# Patient Record
Sex: Female | Born: 1980 | Race: White | Hispanic: No | Marital: Married | Smoking: Former smoker
Health system: Southern US, Community
[De-identification: ages and names within clinical notes are randomized; demographics above are authoritative.]

## PROBLEM LIST (undated history)

## (undated) ENCOUNTER — Inpatient Hospital Stay: Payer: Self-pay

## (undated) DIAGNOSIS — O163 Unspecified maternal hypertension, third trimester: Secondary | ICD-10-CM

## (undated) DIAGNOSIS — F329 Major depressive disorder, single episode, unspecified: Secondary | ICD-10-CM

## (undated) DIAGNOSIS — Z9889 Other specified postprocedural states: Secondary | ICD-10-CM

## (undated) DIAGNOSIS — F419 Anxiety disorder, unspecified: Secondary | ICD-10-CM

## (undated) DIAGNOSIS — R519 Headache, unspecified: Secondary | ICD-10-CM

## (undated) DIAGNOSIS — J309 Allergic rhinitis, unspecified: Secondary | ICD-10-CM

## (undated) DIAGNOSIS — T4145XA Adverse effect of unspecified anesthetic, initial encounter: Secondary | ICD-10-CM

## (undated) DIAGNOSIS — R112 Nausea with vomiting, unspecified: Secondary | ICD-10-CM

## (undated) DIAGNOSIS — IMO0001 Reserved for inherently not codable concepts without codable children: Secondary | ICD-10-CM

## (undated) DIAGNOSIS — R51 Headache: Secondary | ICD-10-CM

## (undated) DIAGNOSIS — R030 Elevated blood-pressure reading, without diagnosis of hypertension: Secondary | ICD-10-CM

## (undated) DIAGNOSIS — K219 Gastro-esophageal reflux disease without esophagitis: Secondary | ICD-10-CM

## (undated) DIAGNOSIS — G43909 Migraine, unspecified, not intractable, without status migrainosus: Secondary | ICD-10-CM

## (undated) DIAGNOSIS — T8859XA Other complications of anesthesia, initial encounter: Secondary | ICD-10-CM

## (undated) DIAGNOSIS — G8929 Other chronic pain: Secondary | ICD-10-CM

## (undated) DIAGNOSIS — F32A Depression, unspecified: Secondary | ICD-10-CM

## (undated) DIAGNOSIS — O139 Gestational [pregnancy-induced] hypertension without significant proteinuria, unspecified trimester: Secondary | ICD-10-CM

## (undated) HISTORY — PX: DILATION AND CURETTAGE OF UTERUS: SHX78

## (undated) HISTORY — DX: Headache: R51

## (undated) HISTORY — DX: Depression, unspecified: F32.A

## (undated) HISTORY — DX: Gastro-esophageal reflux disease without esophagitis: K21.9

## (undated) HISTORY — DX: Allergic rhinitis, unspecified: J30.9

## (undated) HISTORY — DX: Major depressive disorder, single episode, unspecified: F32.9

## (undated) HISTORY — DX: Migraine, unspecified, not intractable, without status migrainosus: G43.909

## (undated) HISTORY — DX: Gestational (pregnancy-induced) hypertension without significant proteinuria, unspecified trimester: O13.9

## (undated) HISTORY — DX: Headache, unspecified: R51.9

## (undated) HISTORY — DX: Other chronic pain: G89.29

## (undated) HISTORY — DX: Elevated blood-pressure reading, without diagnosis of hypertension: R03.0

## (undated) HISTORY — DX: Unspecified maternal hypertension, third trimester: O16.3

## (undated) HISTORY — DX: Anxiety disorder, unspecified: F41.9

## (undated) HISTORY — DX: Reserved for inherently not codable concepts without codable children: IMO0001

---
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
## 2007-08-06 ENCOUNTER — Emergency Department: Payer: Self-pay | Admitting: Emergency Medicine

## 2008-11-01 IMAGING — US US PELV - US TRANSVAGINAL
1 series · 17 of 25 positions shown · non-contrast
Comparison: none

REASON FOR EXAM: vaginal bleeding, abnormal
COMMENTS:

[Series 1: us pelv - us transvaginal · 17 of 64 slices shown]
[im 1/64]
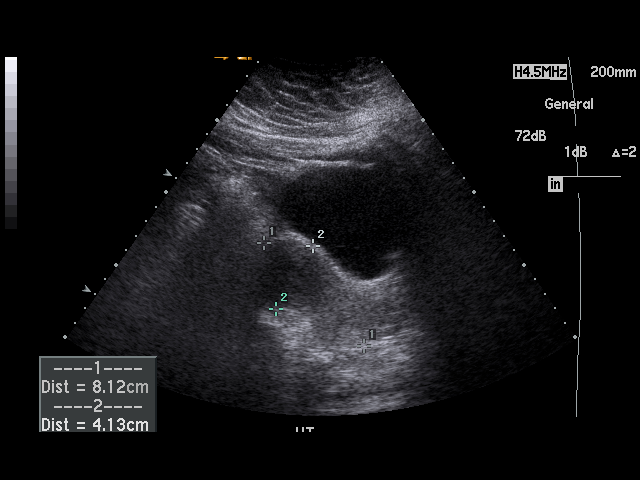
[im 6/64]
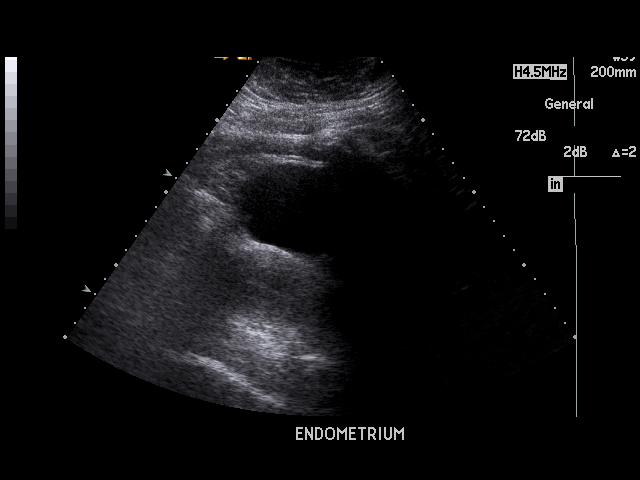
[im 8/64]
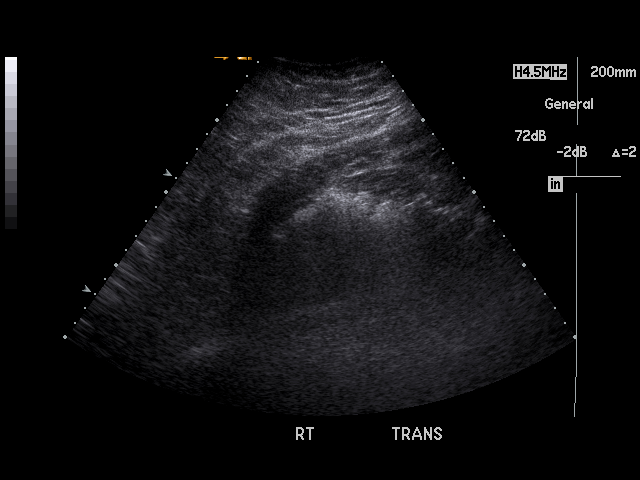
[im 14/64]
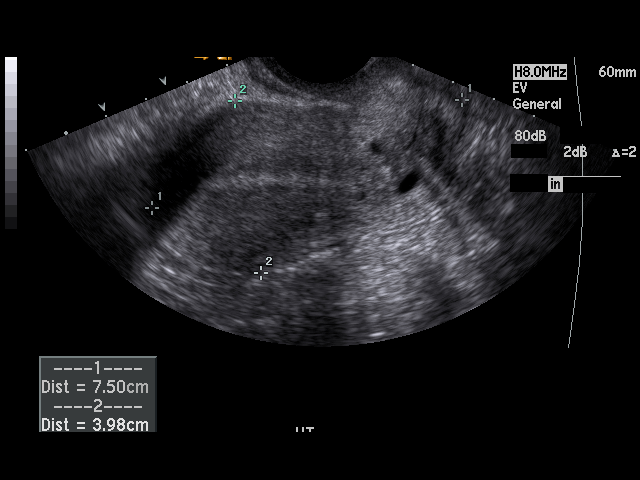
[im 16/64]
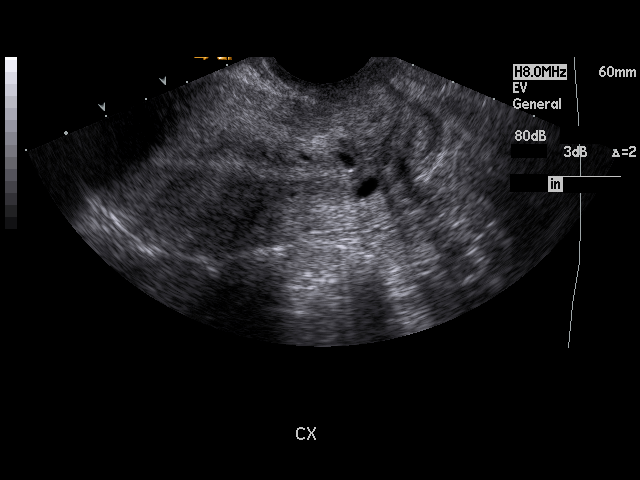
[im 22/64]
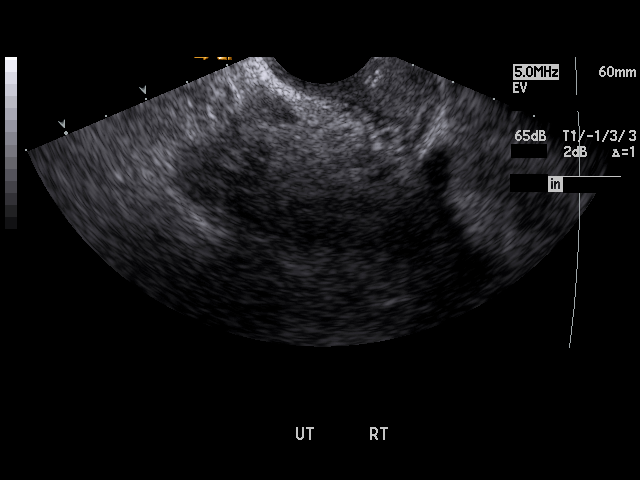
[im 24/64]
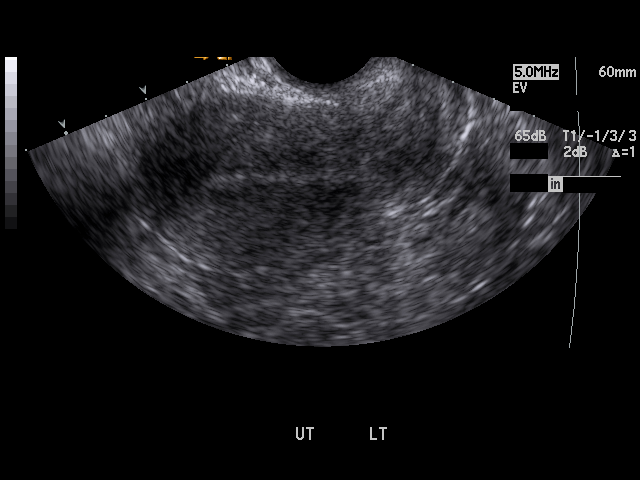
[im 29/64]
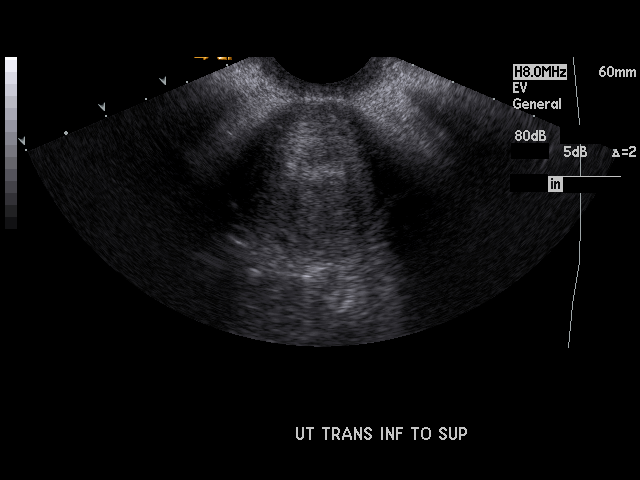
[im 32/64]
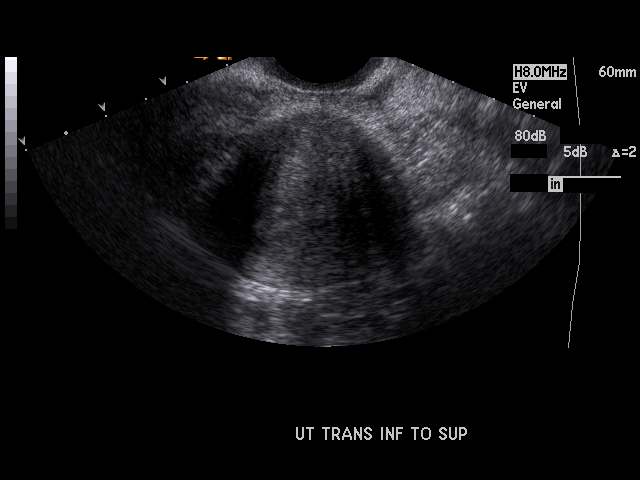
[im 35/64]
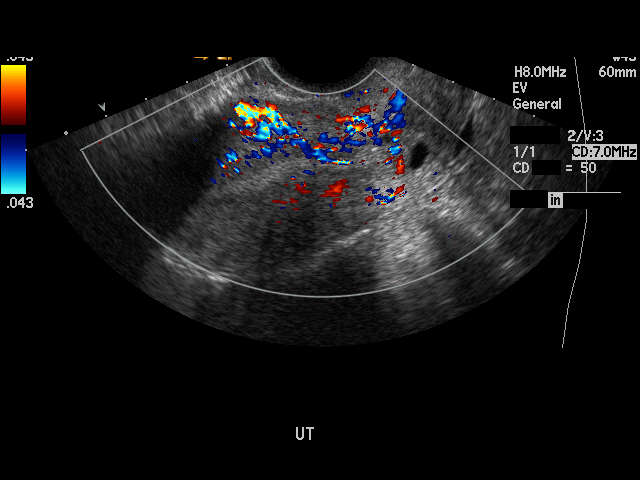
[im 40/64]
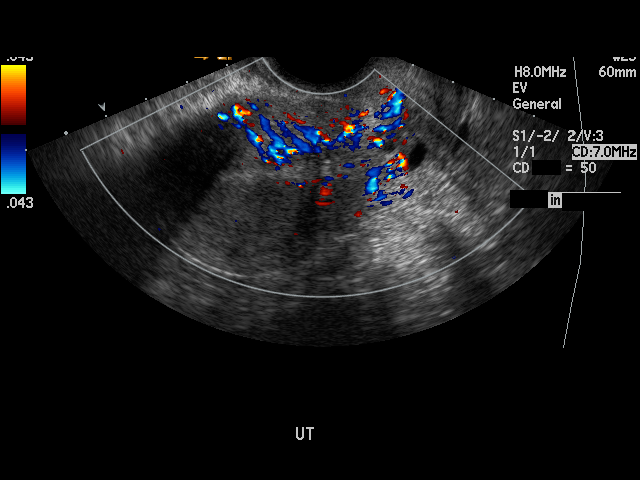
[im 43/64]
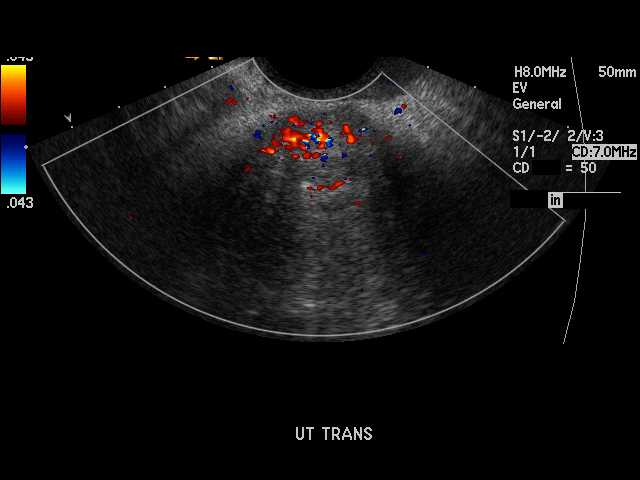
[im 48/64]
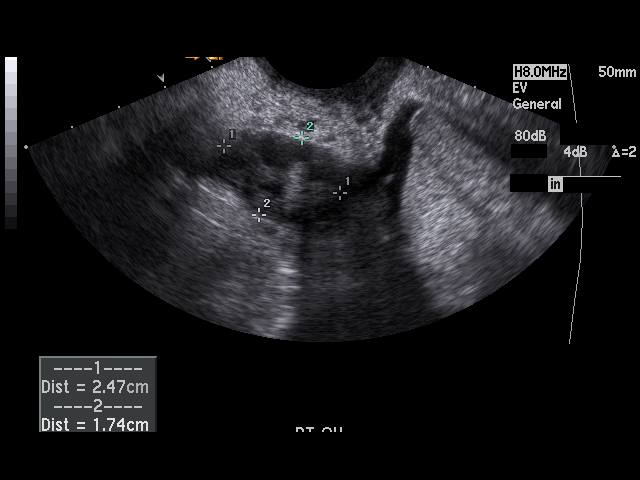
[im 50/64]
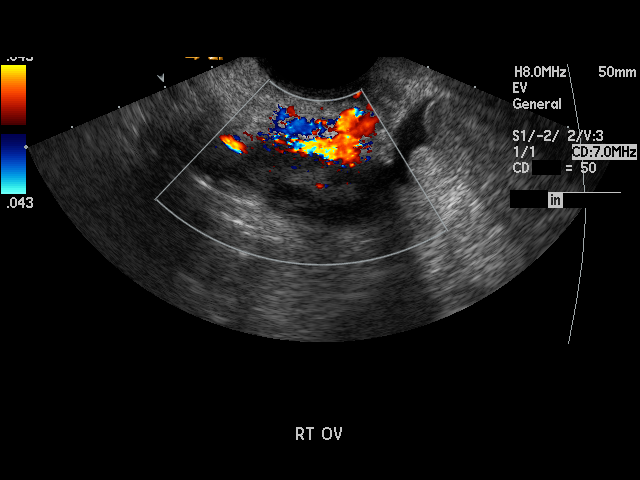
[im 56/64]
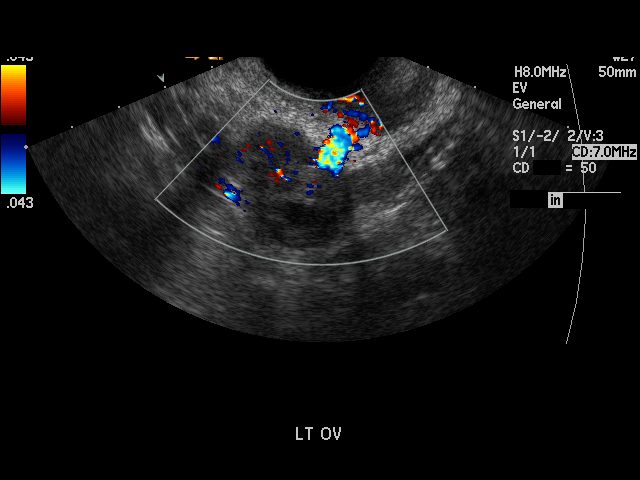
[im 58/64]
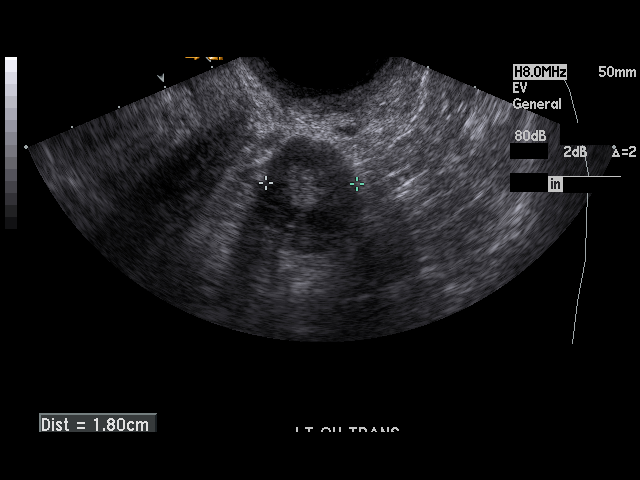
[im 64/64]
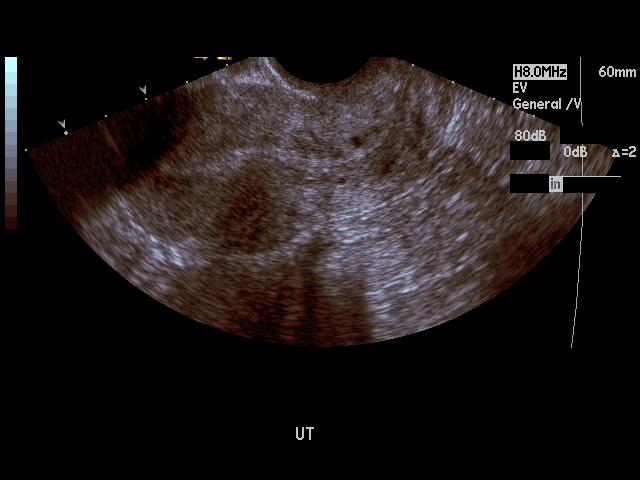

[17 of 25 positions shown; findings below may reference images not displayed]

PROCEDURE:     US  - US PELVIS MASS EXAM  - [DATE] [DATE] [DATE]  [DATE]

RESULT:     The uterus measures 7.5 x 4.2 x 4.2 cm. No uterine masses were
identified. The endometrial stripe measures 4.8 mm. There are nabothian
cysts present. There is no free fluid in the cul-de-sac. The RIGHT ovary
measured 2.51 0.7 x 2.0 cm. The LEFT ovary measured 2.8 x 1.8 x 1.8.
IMPRESSION: 1. There is no evidence of an IUP. The endometrial stripe does not appear
thickened. There is subtle decreased echogenicity in the lower uterine
segment separate from the nabothian cysts but the etiology is not clear. A
followup ultrasound would be of value.
2. I do not see evidence of adnexal masses.

A preliminary report was sent to the [HOSPITAL] the conclusion
of the study.

## 2011-03-19 ENCOUNTER — Inpatient Hospital Stay: Payer: Self-pay | Admitting: Obstetrics and Gynecology

## 2013-12-16 ENCOUNTER — Encounter: Payer: Self-pay | Admitting: Podiatrist

## 2013-12-16 ENCOUNTER — Ambulatory Visit (INDEPENDENT_AMBULATORY_CARE_PROVIDER_SITE_OTHER): Payer: 59 | Admitting: Podiatrist

## 2013-12-16 VITALS — BP 138/86 | HR 68 | Resp 16 | Ht 67.0 in | Wt 175.0 lb

## 2013-12-16 DIAGNOSIS — L600 Ingrowing nail: Secondary | ICD-10-CM

## 2013-12-16 MED ORDER — CEPHALEXIN 500 MG PO CAPS: 500.0000 mg | ORAL_CAPSULE | Freq: Three times a day (TID) | ORAL | Status: DC

## 2013-12-16 NOTE — Patient Instructions (Signed)

## 2013-12-16 NOTE — Progress Notes (Signed)
   Subjective:    Patient ID: Melinda Cortez, female    DOB: 1980/12/08, 33 y.o.   MRN: [id number]  HPI Comments: N pain L great toenails b/l D off and on 20 yrs O slowly C worse A growing out  T pt cuts them out     Review of Systems  All other systems reviewed and are negative.       Objective:   Physical Exam GENERAL APPEARANCE: Alert, conversant. Appropriately groomed. No acute distress.  VASCULAR: Pedal pulses palpable at 2/4 DP and PT bilateral.  Capillary refill time is immediate to all digits,  Proximal to distal cooling it warm to warm.  Digital hair growth is present bilateral  NEUROLOGIC: sensation is intact epicritically and protectively to 5.07 monofilament at 5/5 sites bilateral.  Light touch is intact bilateral, vibratory sensation intact bilateral, achilles tendon reflex is intact bilateral.  MUSCULOSKELETAL: acceptable muscle strength, tone and stability bilateral.  Intrinsic muscluature intact bilateral.  Rectus appearance of foot and digits noted bilateral.   DERMATOLOGIC: Ingrown toenail deformity is present on bilateral borders of both great toenails. They're painful and symptomatic with pressure. No redness, no swelling, no signs of infection are present.    Assessment & Plan:  Ingrown toenail bilateral hallux nails bilateral borders Plan: Treatment options and alternatives discussed.  Recommended permanent phenol matrixectomy and patient agreed.  Bilateral hallux nail was prepped with alcohol and a 1 to 1 mix of 0.5% marcaine plain and 2% lidocaine plain was administered in a digital block fashion.  The toe was then prepped with betadine solution and exsanguinated.  The offending nail border was then excised and matrix tissue exposed.  Phenol was then applied to the matrix tissue followed by an alcohol wash.  Antibiotic ointment and a dry sterile dressing was applied.  The patient was dispensed instructions for aftercare.

## 2015-10-30 ENCOUNTER — Ambulatory Visit (INDEPENDENT_AMBULATORY_CARE_PROVIDER_SITE_OTHER): Payer: 59 | Admitting: Podiatry

## 2015-10-30 ENCOUNTER — Ambulatory Visit (INDEPENDENT_AMBULATORY_CARE_PROVIDER_SITE_OTHER): Payer: 59

## 2015-10-30 ENCOUNTER — Encounter: Payer: Self-pay | Admitting: Podiatry

## 2015-10-30 VITALS — BP 106/71 | HR 82 | Resp 12

## 2015-10-30 DIAGNOSIS — M722 Plantar fascial fibromatosis: Secondary | ICD-10-CM

## 2015-10-30 MED ORDER — METHYLPREDNISOLONE 4 MG PO TBPK: ORAL_TABLET | ORAL | Status: DC

## 2015-10-30 MED ORDER — MELOXICAM 15 MG PO TABS: 15.0000 mg | ORAL_TABLET | Freq: Every day | ORAL | Status: DC

## 2015-10-30 NOTE — Progress Notes (Signed)
   Subjective:    Patient ID: Melinda Cortez, female    DOB: Jan 27, 1981, 35 y.o.   MRN: [id number]  HPI: She presents today with a chief complaint of painful plantar fasciitis bilateral right greater than left 3 years. She is tried nothing for this and states that it just seems to be getting worse. She has used chiropractic orthotics to no avail.    Review of Systems  Musculoskeletal: Positive for gait problem.  All other systems reviewed and are negative.      Objective:   Physical Exam: 35 year old female vital signs stable alert and oriented 3 no apparent distress. Pulses are strongly palpable. Neurologic sensorium is intact per Semmes-Weinstein monofilament. Deep tendon reflexes are intact bilateral and muscle strength +5 over 5 dorsiflexion plantar flexors and inverters everters all just musculature is intact. Orthopedic evaluation demonstrates pain on palpation medial calcaneal tubercle right greater than left. Radiographs do demonstrate soft tissue increase in density with plantar distally oriented calcaneal heel spur of the right heel left foot does demonstrate also similar findings without the heel spur.          Assessment & Plan:  Assessment: Chronic intractable plantar fasciitis right.  Plan: Injected the bilateral heels today with Kenalog and local anesthetic started on a Medrol Dosepak to be followed by meloxicam. Placed her in a plantar fascial brace and a night splint. Discussed appropriate shoe gear stretching exercises ice therapy shoe gear modifications and the longevity of this chronic plantar fasciitis that may need surgical intervention.

## 2015-10-30 NOTE — Patient Instructions (Signed)

## 2015-11-27 ENCOUNTER — Ambulatory Visit (INDEPENDENT_AMBULATORY_CARE_PROVIDER_SITE_OTHER): Payer: 59 | Admitting: Podiatry

## 2015-11-27 ENCOUNTER — Other Ambulatory Visit: Payer: Self-pay | Admitting: Podiatry

## 2015-11-27 ENCOUNTER — Encounter: Payer: Self-pay | Admitting: Podiatry

## 2015-11-27 VITALS — BP 124/79 | HR 54 | Resp 16

## 2015-11-27 DIAGNOSIS — M722 Plantar fascial fibromatosis: Secondary | ICD-10-CM | POA: Diagnosis not present

## 2015-11-27 NOTE — Progress Notes (Signed)
She presents today for follow-up of her plantar fasciitis of her right foot. She denies fever chills nausea vomiting muscle aches and pains.  Objective: Pulses are strongly palpable. Neurologic sensorium is intact. Orthopedic evaluation demonstrate all joints distal to the ankle of a full range of motion without crepitation. She has minimal pain on palpation medial calcaneal tubercle of her right heel. No pain on the left heel.  Assessment Resolving plantar fasciitis 80-85% right foot.  Plan: I encouraged her to continue all conservative therapies including plantar fascial brace night splint and anti-inflammatories. Follow up with me should she develop any regression.

## 2015-11-28 NOTE — Telephone Encounter (Signed)
Pt needs an appt prior to future refills. 

## 2016-01-30 ENCOUNTER — Encounter: Payer: Self-pay | Admitting: Family Medicine

## 2016-01-30 ENCOUNTER — Ambulatory Visit: Payer: 59 | Admitting: Nurse Practitioner

## 2016-01-30 ENCOUNTER — Other Ambulatory Visit: Payer: Self-pay | Admitting: Family Medicine

## 2016-01-30 ENCOUNTER — Ambulatory Visit (INDEPENDENT_AMBULATORY_CARE_PROVIDER_SITE_OTHER): Payer: 59 | Admitting: Family Medicine

## 2016-01-30 VITALS — BP 124/88 | HR 66 | Temp 97.7°F | Ht 67.0 in | Wt 208.2 lb

## 2016-01-30 DIAGNOSIS — R945 Abnormal results of liver function studies: Principal | ICD-10-CM

## 2016-01-30 DIAGNOSIS — R7989 Other specified abnormal findings of blood chemistry: Secondary | ICD-10-CM | POA: Diagnosis not present

## 2016-01-30 DIAGNOSIS — R748 Abnormal levels of other serum enzymes: Secondary | ICD-10-CM | POA: Diagnosis not present

## 2016-01-30 DIAGNOSIS — F419 Anxiety disorder, unspecified: Secondary | ICD-10-CM | POA: Insufficient documentation

## 2016-01-30 DIAGNOSIS — F329 Major depressive disorder, single episode, unspecified: Secondary | ICD-10-CM | POA: Insufficient documentation

## 2016-01-30 NOTE — Assessment & Plan Note (Signed)
Elevated on recent lab work. Suspect related to prenatal vitamin that has vitamin B12 in it. We will recheck this.

## 2016-01-30 NOTE — Patient Instructions (Signed)
Nice to meet you. We are going to recheck your lab work and we'll call with the results. Please consider whether or not you wanted to see a therapist for your anxiety. Please continue to monitor her headaches if they become more bothersome please let us know. If you develop worsening headaches, or develop numbness, weakness, vision changes, or any new or changing symptoms please seek medical attention.

## 2016-01-30 NOTE — Assessment & Plan Note (Signed)
Moderately well controlled on Zoloft. Patient is trying to get pregnant and I discussed the risk / benefit of this medication if she were to become pregnant. She had previously discussed this with her obstetrician. Discussed that if she would like further help with this therapy would probably be the next step given her desire to become pregnant. She'll consider this and let us know if she would like to proceed with therapy.

## 2016-01-30 NOTE — Assessment & Plan Note (Signed)
Minimally elevated on last check a month ago. Benign abdominal exam. We will recheck today. I did discuss if still elevated the next step would be ultrasound of her liver and hepatitis serologies.

## 2016-01-30 NOTE — Progress Notes (Signed)
Pre visit review using our clinic review tool, if applicable. No additional management support is needed unless otherwise documented below in the visit note. 

## 2016-01-30 NOTE — Progress Notes (Signed)
Patient ID: Melinda Cortez, female   DOB: 1981-05-30, 35 y.o.   MRN: [id number]  Melinda Rumps, MD Phone: [phone]  Melinda Cortez is a 35 y.o. female who presents today for new patient visit.  Elevated LFTs: These were found on recent lab work done by her gynecologist. She denies drinking alcohol. No Tylenol use. No cholesterol history. No family history of liver issues. She is on a multivitamin that has B12 in it. No abdominal pain.  Anxiety: Notes she's been on Zoloft for several years. Notes this has helped quite a bit though she does still have some underlying anxiety. Inability plan makes her most anxious. No depression. No SI or HI. She is currently trying to get pregnant.  Headaches: Patient notes migraine-like headaches only with her periods. Notes some photophobia and phonophobia with these. No numbness, weakness, or vision changes. Excedrin helps some.  Active Ambulatory Problems    Diagnosis Date Noted  . Elevated LFTs 01/30/2016  . Elevated vitamin B12 level 01/30/2016  . Anxiety 01/30/2016   Resolved Ambulatory Problems    Diagnosis Date Noted  . No Resolved Ambulatory Problems   Past Medical History  Diagnosis Date  . Depression   . Chronic headaches   . Elevated blood pressure     Family History  Problem Relation Age of Onset  . Alcoholism    . Breast cancer    . Lung cancer    . Hypertension    . Mental illness      Social History   Social History  . Marital Status: Married    Spouse Name: N/A  . Number of Children: N/A  . Years of Education: N/A   Occupational History  . Not on file.   Social History Main Topics  . Smoking status: Former Research scientist (life sciences)  . Smokeless tobacco: Not on file  . Alcohol Use: No  . Drug Use: No  . Sexual Activity: Not on file   Other Topics Concern  . Not on file   Social History Narrative    ROS  General:  Negative for nexplained weight loss, fever Skin: Negative for new or changing mole, sore that won't  heal HEENT: Negative for trouble hearing, trouble seeing, ringing in ears, mouth sores, hoarseness, change in voice, dysphagia. CV:  Negative for chest pain, dyspnea, edema, palpitations Resp: Negative for cough, dyspnea, hemoptysis GI: Positive for nausea, negative for vomiting, diarrhea, constipation, abdominal pain, melena, hematochezia. GU: Negative for dysuria, incontinence, urinary hesitance, hematuria, vaginal or penile discharge, polyuria, sexual difficulty, lumps in testicle or breasts MSK: Negative for muscle cramps or aches, joint pain or swelling Neuro: Positive for headaches, negative for weakness, numbness, dizziness, passing out/fainting Psych: Positive for anxiety, negative for depression, memory problems  Objective  Physical Exam Filed Vitals:   01/30/16 1317  BP: 124/88  Pulse: 66  Temp: 97.7 F (36.5 C)    BP Readings from Last 3 Encounters:  01/30/16 124/88  11/27/15 124/79  10/30/15 106/71   Wt Readings from Last 3 Encounters:  01/30/16 208 lb 3.2 oz (94.439 kg)  12/16/13 175 lb (79.379 kg)    Physical Exam  Constitutional: She is well-developed, well-nourished, and in no distress.  HENT:  Head: Normocephalic and atraumatic.  Right Ear: External ear normal.  Left Ear: External ear normal.  Mouth/Throat: Oropharynx is clear and moist. No oropharyngeal exudate.  Eyes: Conjunctivae are normal. Pupils are equal, round, and reactive to light.  Neck: Neck supple.  Cardiovascular: Normal rate, regular rhythm and  normal heart sounds.   Pulmonary/Chest: Effort normal and breath sounds normal.  Abdominal: Soft. Bowel sounds are normal. She exhibits no distension and no mass. There is no tenderness. There is no rebound and no guarding.  Musculoskeletal: She exhibits no edema.  Lymphadenopathy:    She has no cervical adenopathy.  Neurological: She is alert.  CN 2-12 intact, 5/5 strength in bilateral biceps, triceps, grip, quads, hamstrings, plantar and  dorsiflexion, sensation to light touch intact in bilateral UE and LE, normal gait, 2+ patellar reflexes  Skin: Skin is warm and dry. She is not diaphoretic.  Psychiatric: Affect normal.  Mood mildly anxious     Assessment/Plan:   Elevated vitamin B12 level Elevated on recent lab work. Suspect related to prenatal vitamin that has vitamin B12 in it. We will recheck this.  Elevated LFTs Minimally elevated on last check a month ago. Benign abdominal exam. We will recheck today. I did discuss if still elevated the next step would be ultrasound of her liver and hepatitis serologies.  Anxiety Moderately well controlled on Zoloft. Patient is trying to get pregnant and I discussed the risk / benefit of this medication if she were to become pregnant. She had previously discussed this with her obstetrician. Discussed that if she would like further help with this therapy would probably be the next step given her desire to become pregnant. She'll consider this and let us know if she would like to proceed with therapy.    Melinda Rumps, MD [place]

## 2016-01-31 ENCOUNTER — Encounter: Payer: Self-pay | Admitting: Family Medicine

## 2016-01-31 LAB — COMPREHENSIVE METABOLIC PANEL
ALT: 15 IU/L (ref 0–32)
AST: 32 IU/L (ref 0–40)
Albumin/Globulin Ratio: 1.5 (ref 1.2–2.2)
Albumin: 4.1 g/dL (ref 3.5–5.5)
Alkaline Phosphatase: 54 IU/L (ref 39–117)
BUN/Creatinine Ratio: 21 (ref 9–23)
BUN: 14 mg/dL (ref 6–20)
Bilirubin Total: 0.5 mg/dL (ref 0.0–1.2)
CO2: 21 mmol/L (ref 18–29)
Calcium: 9.1 mg/dL (ref 8.7–10.2)
Chloride: 101 mmol/L (ref 96–106)
Creatinine, Ser: 0.67 mg/dL (ref 0.57–1.00)
GFR calc Af Amer: 133 mL/min/{1.73_m2} (ref >59)
GFR calc non Af Amer: 115 mL/min/{1.73_m2} (ref >59)
Globulin, Total: 2.7 g/dL (ref 1.5–4.5)
Glucose: 79 mg/dL (ref 65–99)
Potassium: 5.6 mmol/L — ABNORMAL HIGH (ref 3.5–5.2)
Sodium: 143 mmol/L (ref 134–144)
Total Protein: 6.8 g/dL (ref 6.0–8.5)

## 2016-01-31 LAB — B12 AND FOLATE PANEL
Folate: >20 ng/mL (ref >3.0)
Vitamin B-12: 744 pg/mL (ref 211–946)

## 2016-02-07 ENCOUNTER — Other Ambulatory Visit: Payer: Self-pay | Admitting: Family Medicine

## 2016-02-08 LAB — COMPREHENSIVE METABOLIC PANEL
ALBUMIN: 4.3 g/dL (ref 3.5–5.5)
ALT: 13 IU/L (ref 0–32)
AST: 20 IU/L (ref 0–40)
Albumin/Globulin Ratio: 1.8 (ref 1.2–2.2)
Alkaline Phosphatase: 58 IU/L (ref 39–117)
BUN / CREAT RATIO: 29 — AB (ref 9–23)
BUN: 19 mg/dL (ref 6–20)
Bilirubin Total: 0.5 mg/dL (ref 0.0–1.2)
CALCIUM: 9.3 mg/dL (ref 8.7–10.2)
CO2: 23 mmol/L (ref 18–29)
CREATININE: 0.65 mg/dL (ref 0.57–1.00)
Chloride: 97 mmol/L (ref 96–106)
GFR, EST AFRICAN AMERICAN: 134 mL/min/{1.73_m2} (ref >59)
GFR, EST NON AFRICAN AMERICAN: 116 mL/min/{1.73_m2} (ref >59)
GLOBULIN, TOTAL: 2.4 g/dL (ref 1.5–4.5)
Glucose: 66 mg/dL (ref 65–99)
Potassium: 4.1 mmol/L (ref 3.5–5.2)
SODIUM: 137 mmol/L (ref 134–144)
TOTAL PROTEIN: 6.7 g/dL (ref 6.0–8.5)

## 2016-02-13 ENCOUNTER — Encounter: Payer: Self-pay | Admitting: Family Medicine

## 2016-03-11 ENCOUNTER — Encounter: Payer: Self-pay | Admitting: Family Medicine

## 2016-03-11 ENCOUNTER — Ambulatory Visit (INDEPENDENT_AMBULATORY_CARE_PROVIDER_SITE_OTHER): Payer: 59 | Admitting: Family Medicine

## 2016-03-11 VITALS — BP 110/76 | HR 63 | Temp 98.6°F | Ht 67.0 in | Wt 199.0 lb

## 2016-03-11 DIAGNOSIS — F419 Anxiety disorder, unspecified: Secondary | ICD-10-CM

## 2016-03-11 DIAGNOSIS — F32A Depression, unspecified: Secondary | ICD-10-CM

## 2016-03-11 DIAGNOSIS — R945 Abnormal results of liver function studies: Secondary | ICD-10-CM

## 2016-03-11 DIAGNOSIS — R7989 Other specified abnormal findings of blood chemistry: Secondary | ICD-10-CM | POA: Diagnosis not present

## 2016-03-11 DIAGNOSIS — F329 Major depressive disorder, single episode, unspecified: Secondary | ICD-10-CM | POA: Diagnosis not present

## 2016-03-11 DIAGNOSIS — F418 Other specified anxiety disorders: Secondary | ICD-10-CM

## 2016-03-11 MED ORDER — SERTRALINE HCL 100 MG PO TABS: 100.0000 mg | ORAL_TABLET | Freq: Every day | ORAL | Status: DC

## 2016-03-11 NOTE — Progress Notes (Signed)
Pre visit review using our clinic review tool, if applicable. No additional management support is needed unless otherwise documented below in the visit note. 

## 2016-03-11 NOTE — Assessment & Plan Note (Signed)
Stable. Continue Zoloft. Had discussion regarding therapist referral. Referral was placed. Given return precautions.

## 2016-03-11 NOTE — Progress Notes (Signed)
Patient ID: Melinda Cortez, female   DOB: 18-Aug-1981, 35 y.o.   MRN: [id number]  Tommi Rumps, MD Phone: [phone]  Melinda Cortez is a 35 y.o. female who presents today for follow-up.  Anxiety and depression: Patient notes this is about the same. She's been on Zoloft and this is helped significantly since she started it. Feels like her anxiety is better since starting the Zoloft depression might be slightly worse. No SI or HI. Has not seen a therapist previously.   GAD 7 : Generalized Anxiety Score 03/11/2016 01/30/2016  Nervous, Anxious, on Edge 1 1  Control/stop worrying 1 1  Worry too much - different things 1 1  Trouble relaxing 2 2  Restless 0 0  Easily annoyed or irritable 2 2  Afraid - awful might happen 0 0  Total GAD 7 Score 7 7  Anxiety Difficulty Somewhat difficult Somewhat difficult   Depression screen [facility] 2/9 03/11/2016 01/30/2016  Decreased Interest 1 0  Down, Depressed, Hopeless 1 0  PHQ - 2 Score 2 0  Altered sleeping 1 -  Tired, decreased energy 2 -  Change in appetite 1 -  Feeling bad or failure about yourself  1 -  Trouble concentrating 0 -  Moving slowly or fidgety/restless 0 -  Suicidal thoughts 0 -  PHQ-9 Score 7 -  Difficult doing work/chores Somewhat difficult -    Elevated LFTs: Return to normal on most recent check. No right upper quadrant discomfort.  PMH: Former smoker   ROS see history of present illness  Objective  Physical Exam Filed Vitals:   03/11/16 1035  BP: 110/76  Pulse: 63  Temp: 98.6 F (37 C)    BP Readings from Last 3 Encounters:  03/11/16 110/76  01/30/16 124/88  11/27/15 124/79   Wt Readings from Last 3 Encounters:  03/11/16 199 lb (90.266 kg)  01/30/16 208 lb 3.2 oz (94.439 kg)  12/16/13 175 lb (79.379 kg)    Physical Exam  Constitutional: She is well-developed, well-nourished, and in no distress.  HENT:  Head: Normocephalic and atraumatic.  Right Ear: External ear normal.  Left Ear: External ear  normal.  Cardiovascular: Normal rate, regular rhythm and normal heart sounds.   Pulmonary/Chest: Effort normal and breath sounds normal.  Skin: Skin is warm and dry. She is not diaphoretic.  Psychiatric:  Mood depressed, affect normal     Assessment/Plan: Please see individual problem list.  Anxiety and depression Stable. Continue Zoloft. Had discussion regarding therapist referral. Referral was placed. Given return precautions.  Elevated LFTs Returned to normal after repeated rechecks. We'll continue to monitor periodically.    Orders Placed This Encounter  Procedures  . Ambulatory referral to Psychology    Referral Priority:  Routine    Referral Type:  Psychiatric    Referral Reason:  Specialty Services Required    Requested Specialty:  Psychology    Number of Visits Requested:  Lorraine, MD [facility]

## 2016-03-11 NOTE — Patient Instructions (Signed)
Nice to see you. Please continue your Zoloft. We will refer you to a therapist. If you do not hear anything regarding this in the next week please let us know. If you develop thoughts of harming yourself or others please seek medical attention immediately.

## 2016-03-11 NOTE — Assessment & Plan Note (Signed)
Returned to normal after repeated rechecks. We'll continue to monitor periodically.

## 2016-03-21 ENCOUNTER — Encounter: Payer: Self-pay | Admitting: Family Medicine

## 2016-04-18 ENCOUNTER — Ambulatory Visit (INDEPENDENT_AMBULATORY_CARE_PROVIDER_SITE_OTHER): Payer: 59 | Admitting: Psychology

## 2016-04-18 DIAGNOSIS — F4323 Adjustment disorder with mixed anxiety and depressed mood: Secondary | ICD-10-CM

## 2016-05-23 ENCOUNTER — Telehealth: Payer: Self-pay | Admitting: Family Medicine

## 2016-05-23 ENCOUNTER — Ambulatory Visit (INDEPENDENT_AMBULATORY_CARE_PROVIDER_SITE_OTHER): Payer: 59 | Admitting: Psychology

## 2016-05-23 DIAGNOSIS — F4323 Adjustment disorder with mixed anxiety and depressed mood: Secondary | ICD-10-CM | POA: Diagnosis not present

## 2016-05-23 NOTE — Telephone Encounter (Signed)
Pt called wanting to see if she can get a sooner appt to change medication per counselor at [facility]. If possible? Pt already has a appt on 06/10/16. Please advise?   Call pt @ [phone]. Thank you!

## 2016-05-23 NOTE — Telephone Encounter (Signed)
Please advise, thanks.

## 2016-05-23 NOTE — Telephone Encounter (Signed)
Scheduled patient for Tuesday 05/28/16

## 2016-05-28 ENCOUNTER — Ambulatory Visit (INDEPENDENT_AMBULATORY_CARE_PROVIDER_SITE_OTHER): Payer: 59 | Admitting: Family Medicine

## 2016-05-28 DIAGNOSIS — F329 Major depressive disorder, single episode, unspecified: Secondary | ICD-10-CM

## 2016-05-28 DIAGNOSIS — F32A Depression, unspecified: Secondary | ICD-10-CM

## 2016-05-28 DIAGNOSIS — F418 Other specified anxiety disorders: Secondary | ICD-10-CM

## 2016-05-28 DIAGNOSIS — F419 Anxiety disorder, unspecified: Secondary | ICD-10-CM

## 2016-05-28 MED ORDER — BUPROPION HCL ER (XL) 150 MG PO TB24: 150.0000 mg | ORAL_TABLET | Freq: Every day | ORAL | 1 refills | Status: DC

## 2016-05-28 NOTE — Patient Instructions (Signed)
Nice to see you. We are going to add Wellbutrin to your Zoloft. Please continue to see your therapist. If you develop a plan or intent to harm self, worsening anxiety or depression, or any new or changing symptoms please seek medical attention immediately.

## 2016-05-28 NOTE — Progress Notes (Signed)
Pre visit review using our clinic review tool, if applicable. No additional management support is needed unless otherwise documented below in the visit note. 

## 2016-05-28 NOTE — Progress Notes (Signed)
  Tommi Rumps, MD Phone: [phone]  Melinda Cortez is a 35 y.o. female who presents today for f/u.  Depression/anxiety: Patient notes this is worse than previously. She's been seeing a therapist and they suggested that she augment her Zoloft. Notes most of her depression and anxiety stem from long seated family issues. Notes there is a family history of anxiety and depression. Notes she feels numb. Does note some thoughts of being better off not being around and of numbness to things in general though no intent or plan to harm herself.  Depression screen [facility] 2/9 05/28/2016 03/11/2016 01/30/2016  Decreased Interest 2 1 0  Down, Depressed, Hopeless 2 1 0  PHQ - 2 Score 4 2 0  Altered sleeping 2 1 -  Tired, decreased energy 3 2 -  Change in appetite 1 1 -  Feeling bad or failure about yourself  3 1 -  Trouble concentrating 1 0 -  Moving slowly or fidgety/restless 1 0 -  Suicidal thoughts 1 0 -  PHQ-9 Score 16 7 -  Difficult doing work/chores Very difficult Somewhat difficult -   GAD 7 : Generalized Anxiety Score 05/28/2016 03/11/2016 01/30/2016  Nervous, Anxious, on Edge 3 1 1   Control/stop worrying 1 1 1   Worry too much - different things 1 1 1   Trouble relaxing 1 2 2   Restless 2 0 0  Easily annoyed or irritable 3 2 2   Afraid - awful might happen 0 0 0  Total GAD 7 Score 11 7 7   Anxiety Difficulty Very difficult Somewhat difficult Somewhat difficult    ROS see history of present illness  Objective  Physical Exam Vitals:   05/28/16 1047  BP: 122/84  Pulse: 79  Temp: 98.2 F (36.8 C)    BP Readings from Last 3 Encounters:  05/28/16 122/84  03/11/16 110/76  01/30/16 124/88   Wt Readings from Last 3 Encounters:  05/28/16 201 lb (91.2 kg)  03/11/16 199 lb (90.3 kg)  01/30/16 208 lb 3.2 oz (94.4 kg)    Physical Exam  Constitutional: No distress.  Cardiovascular: Normal rate, regular rhythm and normal heart sounds.   Pulmonary/Chest: Effort normal and breath sounds  normal.  Skin: She is not diaphoretic.  Psychiatric:  Mood depressed, affect mildly flat    Assessment/Plan: Please see individual problem list.  Anxiety and depression This issue has worsened. She does note some sensation of feeling numb to things and thoughts of being better off not being around though no intent or plan to harm herself. We will continue Zoloft. We will add Wellbutrin to augment Zoloft. I discussed the small risk of serotonin syndrome with this combination. She'll continue to see her therapist. She'll follow-up with me in 4-6 weeks. She is given return precautions.   No orders of the defined types were placed in this encounter.   Meds ordered this encounter  Medications  . buPROPion (WELLBUTRIN XL) 150 MG 24 hr tablet    Sig: Take 1 tablet (150 mg total) by mouth daily.    Dispense:  90 tablet    Refill:  1   Tommi Rumps, MD [place]

## 2016-05-28 NOTE — Assessment & Plan Note (Signed)
This issue has worsened. She does note some sensation of feeling numb to things and thoughts of being better off not being around though no intent or plan to harm herself. We will continue Zoloft. We will add Wellbutrin to augment Zoloft. I discussed the small risk of serotonin syndrome with this combination. She'll continue to see her therapist. She'll follow-up with me in 4-6 weeks. She is given return precautions.

## 2016-06-06 ENCOUNTER — Ambulatory Visit (INDEPENDENT_AMBULATORY_CARE_PROVIDER_SITE_OTHER): Payer: 59 | Admitting: Psychology

## 2016-06-06 DIAGNOSIS — F4323 Adjustment disorder with mixed anxiety and depressed mood: Secondary | ICD-10-CM | POA: Diagnosis not present

## 2016-06-10 ENCOUNTER — Ambulatory Visit: Payer: 59 | Admitting: Family Medicine

## 2016-06-27 ENCOUNTER — Ambulatory Visit (INDEPENDENT_AMBULATORY_CARE_PROVIDER_SITE_OTHER): Payer: 59 | Admitting: Psychology

## 2016-06-27 DIAGNOSIS — F4323 Adjustment disorder with mixed anxiety and depressed mood: Secondary | ICD-10-CM | POA: Diagnosis not present

## 2016-07-15 ENCOUNTER — Encounter: Payer: Self-pay | Admitting: Family Medicine

## 2016-07-15 ENCOUNTER — Ambulatory Visit (INDEPENDENT_AMBULATORY_CARE_PROVIDER_SITE_OTHER): Payer: 59 | Admitting: Family Medicine

## 2016-07-15 DIAGNOSIS — F329 Major depressive disorder, single episode, unspecified: Secondary | ICD-10-CM

## 2016-07-15 DIAGNOSIS — F418 Other specified anxiety disorders: Secondary | ICD-10-CM

## 2016-07-15 DIAGNOSIS — F419 Anxiety disorder, unspecified: Secondary | ICD-10-CM

## 2016-07-15 DIAGNOSIS — Z23 Encounter for immunization: Secondary | ICD-10-CM

## 2016-07-15 MED ORDER — SERTRALINE HCL 100 MG PO TABS: 50.0000 mg | ORAL_TABLET | Freq: Every day | ORAL | 1 refills | Status: DC

## 2016-07-15 NOTE — Assessment & Plan Note (Signed)
Somewhat improved since her last visit. No current SI or plan or intent to harm herself. She wants to decrease her Zoloft dose to 50 mg. We will do this and she will increase back to 100 if she has worsening of her symptoms. She'll continue Wellbutrin. She'll continue to see her therapist. She'll follow-up with me in 2 months. She is given return precautions.

## 2016-07-15 NOTE — Patient Instructions (Signed)
Nice to see you. I am glad you are feeling better. We will decrease your Zoloft to 50 mg daily. If you begin to feel increased anxiety or depression with this change please increase back to 100 mg and let us know. Please continue to see your counselor. If you develop thoughts of harming herself, intent or plan to harm herself, or any new or change in symptoms please seek medical attention immediately.

## 2016-07-15 NOTE — Progress Notes (Signed)
  Tommi Rumps, MD Phone: [phone]  Melinda Cortez is a 35 y.o. female who presents today for follow-up.  Anxiety/depression: Patient notes she feels better than the last time we saw each other. She notes some days are better than others. Typically worsens around her menstrual cycle. Currently on Zoloft and Wellbutrin. Wellbutrin has made a difference. She is interested in backing off on the Zoloft to 50 mg to see if this will help the depression aspect. I did discuss that most people benefit from increasing the dose as opposed to decreasing the dose. She continues to see her therapist. She notes a single episode of SI that she describes as more of wishing she wasn't alive. She notes no intent or plan to harm herself. No SI at this time.  GAD 7 : Generalized Anxiety Score 07/15/2016 05/28/2016 03/11/2016 01/30/2016  Nervous, Anxious, on Edge 1 3 1 1   Control/stop worrying 1 1 1 1   Worry too much - different things 1 1 1 1   Trouble relaxing 1 1 2 2   Restless 1 2 0 0  Easily annoyed or irritable 1 3 2 2   Afraid - awful might happen 0 0 0 0  Total GAD 7 Score 6 11 7 7   Anxiety Difficulty - Very difficult Somewhat difficult Somewhat difficult   Depression screen [facility] 2/9 07/15/2016 05/28/2016 03/11/2016  Decreased Interest 0 2 1  Down, Depressed, Hopeless 1 2 1   PHQ - 2 Score 1 4 2   Altered sleeping 0 2 1  Tired, decreased energy 1 3 2   Change in appetite 0 1 1  Feeling bad or failure about yourself  1 3 1   Trouble concentrating 0 1 0  Moving slowly or fidgety/restless 0 1 0  Suicidal thoughts 0 1 0  PHQ-9 Score 3 16 7   Difficult doing work/chores - Very difficult Somewhat difficult     ROS see history of present illness  Objective  Physical Exam Vitals:   07/15/16 0852  BP: 108/74  Pulse: 64  Temp: 97.9 F (36.6 C)    BP Readings from Last 3 Encounters:  07/15/16 108/74  05/28/16 122/84  03/11/16 110/76   Wt Readings from Last 3 Encounters:  07/15/16 207 lb 8 oz  (94.1 kg)  05/28/16 201 lb (91.2 kg)  03/11/16 199 lb (90.3 kg)    Physical Exam  Constitutional: She is well-developed, well-nourished, and in no distress.  Cardiovascular: Normal rate, regular rhythm and normal heart sounds.   Pulmonary/Chest: Effort normal and breath sounds normal.  Neurological: She is alert. Gait normal.  Psychiatric:  Mood depressed, affect mildly flat     Assessment/Plan: Please see individual problem list.  Anxiety and depression Somewhat improved since her last visit. No current SI or plan or intent to harm herself. She wants to decrease her Zoloft dose to 50 mg. We will do this and she will increase back to 100 if she has worsening of her symptoms. She'll continue Wellbutrin. She'll continue to see her therapist. She'll follow-up with me in 2 months. She is given return precautions.   No orders of the defined types were placed in this encounter.   Meds ordered this encounter  Medications  . sertraline (ZOLOFT) 100 MG tablet    Sig: Take 0.5 tablets (50 mg total) by mouth daily.    Dispense:  90 tablet    Refill:  1    Tommi Rumps, MD [place]

## 2016-07-24 ENCOUNTER — Encounter: Payer: Self-pay | Admitting: Family Medicine

## 2016-07-25 ENCOUNTER — Other Ambulatory Visit: Payer: Self-pay | Admitting: Family Medicine

## 2016-07-25 MED ORDER — BUPROPION HCL ER (XL) 150 MG PO TB24: 150.0000 mg | ORAL_TABLET | Freq: Every day | ORAL | 3 refills | Status: DC

## 2016-08-01 ENCOUNTER — Ambulatory Visit (INDEPENDENT_AMBULATORY_CARE_PROVIDER_SITE_OTHER): Payer: 59 | Admitting: Psychology

## 2016-08-01 DIAGNOSIS — F4323 Adjustment disorder with mixed anxiety and depressed mood: Secondary | ICD-10-CM | POA: Diagnosis not present

## 2016-08-21 ENCOUNTER — Ambulatory Visit (INDEPENDENT_AMBULATORY_CARE_PROVIDER_SITE_OTHER): Payer: 59 | Admitting: Family Medicine

## 2016-08-21 DIAGNOSIS — J0100 Acute maxillary sinusitis, unspecified: Secondary | ICD-10-CM | POA: Diagnosis not present

## 2016-08-21 MED ORDER — AMOXICILLIN-POT CLAVULANATE 875-125 MG PO TABS: 1.0000 | ORAL_TABLET | Freq: Two times a day (BID) | ORAL | 0 refills | Status: DC

## 2016-08-21 NOTE — Assessment & Plan Note (Signed)
Symptoms and duration most consistent with bacterial sinusitis. We will treat with Augmentin. Advised to use yogurt or probiotics with this. Given return precautions.

## 2016-08-21 NOTE — Progress Notes (Signed)
  Tommi Rumps, MD Phone: [phone]  Melinda Cortez is a 35 y.o. female who presents today for same-day visit.  Patient notes onset of symptoms about a month ago. Has had sinus pressure and congestion. Some mild cough with intermittent yellow productive mucus. Blowing clear mucus out of her nose. Notes she had gotten better and then worsened again. She notes some right ear discomfort as well with this. No fevers. No shortness of breath.   ROS see history of present illness  Objective  Physical Exam Vitals:   08/21/16 1035  BP: 118/82  Pulse: 65  Temp: 97.7 F (36.5 C)    BP Readings from Last 3 Encounters:  08/21/16 118/82  07/15/16 108/74  05/28/16 122/84   Wt Readings from Last 3 Encounters:  08/21/16 221 lb 3.2 oz (100.3 kg)  07/15/16 207 lb 8 oz (94.1 kg)  05/28/16 201 lb (91.2 kg)    Physical Exam  Constitutional: She is well-developed, well-nourished, and in no distress.  HENT:  Head: Normocephalic and atraumatic.  Mouth/Throat: Oropharynx is clear and moist.  Normal TMs bilaterally  Eyes: Conjunctivae are normal. Pupils are equal, round, and reactive to light.  Neck: Neck supple.  Cardiovascular: Normal rate, regular rhythm and normal heart sounds.   Pulmonary/Chest: Effort normal and breath sounds normal.  Lymphadenopathy:    She has no cervical adenopathy.  Neurological: She is alert. Gait normal.     Assessment/Plan: Please see individual problem list.  Sinusitis, acute maxillary Symptoms and duration most consistent with bacterial sinusitis. We will treat with Augmentin. Advised to use yogurt or probiotics with this. Given return precautions.   No orders of the defined types were placed in this encounter.   Meds ordered this encounter  Medications  . amoxicillin-clavulanate (AUGMENTIN) 875-125 MG tablet    Sig: Take 1 tablet by mouth 2 (two) times daily.    Dispense:  14 tablet    Refill:  0   Tommi Rumps, MD [place]

## 2016-08-21 NOTE — Progress Notes (Signed)
Pre visit review using our clinic review tool, if applicable. No additional management support is needed unless otherwise documented below in the visit note. 

## 2016-08-21 NOTE — Patient Instructions (Signed)
Nice to see you. You likely have a sinus infection. We will treat with Augmentin. If you develop a cough productive of blood, fevers, shortness of breath, or any new or changing symptoms please seek medical attention immediately.

## 2016-09-05 ENCOUNTER — Ambulatory Visit (INDEPENDENT_AMBULATORY_CARE_PROVIDER_SITE_OTHER): Payer: 59 | Admitting: Psychology

## 2016-09-05 DIAGNOSIS — F4323 Adjustment disorder with mixed anxiety and depressed mood: Secondary | ICD-10-CM | POA: Diagnosis not present

## 2016-09-12 ENCOUNTER — Encounter: Payer: Self-pay | Admitting: Family Medicine

## 2016-09-12 ENCOUNTER — Ambulatory Visit (INDEPENDENT_AMBULATORY_CARE_PROVIDER_SITE_OTHER): Payer: 59 | Admitting: Family Medicine

## 2016-09-12 DIAGNOSIS — F419 Anxiety disorder, unspecified: Secondary | ICD-10-CM

## 2016-09-12 DIAGNOSIS — F329 Major depressive disorder, single episode, unspecified: Secondary | ICD-10-CM

## 2016-09-12 DIAGNOSIS — F418 Other specified anxiety disorders: Secondary | ICD-10-CM | POA: Diagnosis not present

## 2016-09-12 DIAGNOSIS — J309 Allergic rhinitis, unspecified: Secondary | ICD-10-CM | POA: Insufficient documentation

## 2016-09-12 NOTE — Assessment & Plan Note (Signed)
Symptoms likely related to allergic rhinitis. Discussed given the raw sensation and raw appearance of her nasal passages she should hold the Flonase. She'll continue Zyrtec. She'll use nasal saline. She'll continue to monitor.

## 2016-09-12 NOTE — Patient Instructions (Signed)
Nice to see you. Please discontinue your Flonase. You should use over-the-counter nasal saline spray in your nostrils bilaterally. Please continue your Zyrtec. We will continue Wellbutrin and Zoloft. If your symptoms of sinus irritation do not get better please does know. If you develop thoughts of harming your self or others please seek medical attention immediately.

## 2016-09-12 NOTE — Progress Notes (Signed)
Pre visit review using our clinic review tool, if applicable. No additional management support is needed unless otherwise documented below in the visit note. 

## 2016-09-12 NOTE — Assessment & Plan Note (Signed)
Continues to somewhat improved. Slightly more depression than anxiety at this time. No SI or HI. We will continue Zoloft and Wellbutrin. She'll continue to see her therapist. Follow-up in 4 months.

## 2016-09-12 NOTE — Progress Notes (Signed)
  Tommi Rumps, MD Phone: [phone]  Melinda Cortez is a 35 y.o. female who presents today for follow-up.  Anxiety/depression: Patient notes anxiety is quite a bit better. Some depression. Feels as though the world is coming down on her at times. Some sadness. Feels though this is overall improved. Currently taking Wellbutrin and Zoloft. Seeing a therapist as well. No SI or HI. Does not her symptoms do tend worse around her menstrual cycle.  Allergic rhinitis: Patient was recently treated for a sinus infection. Notes a congestion improved. Does get some rhinorrhea. Notes her nasal passages still feel like there is a burning raw sensation. Occasionally blow some blood out of her nose. Using Flonase. Using Zyrtec.  PMH: former smoker   ROS see history of present illness  Objective  Physical Exam Vitals:   09/12/16 0850  BP: 112/86  Pulse: (!) 56  Temp: 98.1 F (36.7 C)    BP Readings from Last 3 Encounters:  09/12/16 112/86  08/21/16 118/82  07/15/16 108/74   Wt Readings from Last 3 Encounters:  09/12/16 225 lb (102.1 kg)  08/21/16 221 lb 3.2 oz (100.3 kg)  07/15/16 207 lb 8 oz (94.1 kg)    Physical Exam  Constitutional: No distress.  HENT:  Head: Normocephalic and atraumatic.  Mouth/Throat: Oropharynx is clear and moist. No oropharyngeal exudate.  Bilateral nasal mucosa with mild erythema and irritation, normal TMs bilaterally  Eyes: Conjunctivae are normal. Pupils are equal, round, and reactive to light.  Cardiovascular: Normal rate, regular rhythm and normal heart sounds.   Pulmonary/Chest: Effort normal and breath sounds normal.  Skin: She is not diaphoretic.     Assessment/Plan: Please see individual problem list.  Anxiety and depression Continues to somewhat improved. Slightly more depression than anxiety at this time. No SI or HI. We will continue Zoloft and Wellbutrin. She'll continue to see her therapist. Follow-up in 4 months.  Allergic  rhinitis Symptoms likely related to allergic rhinitis. Discussed given the raw sensation and raw appearance of her nasal passages she should hold the Flonase. She'll continue Zyrtec. She'll use nasal saline. She'll continue to monitor.   Tommi Rumps, MD [place]

## 2016-10-17 ENCOUNTER — Ambulatory Visit (INDEPENDENT_AMBULATORY_CARE_PROVIDER_SITE_OTHER): Payer: 59 | Admitting: Psychology

## 2016-10-17 DIAGNOSIS — F4323 Adjustment disorder with mixed anxiety and depressed mood: Secondary | ICD-10-CM | POA: Diagnosis not present

## 2016-11-14 ENCOUNTER — Ambulatory Visit (INDEPENDENT_AMBULATORY_CARE_PROVIDER_SITE_OTHER): Payer: 59 | Admitting: Psychology

## 2016-11-14 DIAGNOSIS — F4323 Adjustment disorder with mixed anxiety and depressed mood: Secondary | ICD-10-CM | POA: Diagnosis not present

## 2016-11-27 ENCOUNTER — Encounter: Payer: Self-pay | Admitting: Advanced Practice Midwife

## 2016-11-27 ENCOUNTER — Ambulatory Visit (INDEPENDENT_AMBULATORY_CARE_PROVIDER_SITE_OTHER): Payer: 59 | Admitting: Advanced Practice Midwife

## 2016-11-27 VITALS — BP 114/84 | HR 64 | Wt 218.0 lb

## 2016-11-27 DIAGNOSIS — Z113 Encounter for screening for infections with a predominantly sexual mode of transmission: Secondary | ICD-10-CM

## 2016-11-27 DIAGNOSIS — Z124 Encounter for screening for malignant neoplasm of cervix: Secondary | ICD-10-CM

## 2016-11-27 DIAGNOSIS — Z3481 Encounter for supervision of other normal pregnancy, first trimester: Secondary | ICD-10-CM

## 2016-11-27 DIAGNOSIS — Z6834 Body mass index (BMI) 34.0-34.9, adult: Secondary | ICD-10-CM

## 2016-11-27 DIAGNOSIS — N912 Amenorrhea, unspecified: Secondary | ICD-10-CM

## 2016-11-27 DIAGNOSIS — O99211 Obesity complicating pregnancy, first trimester: Secondary | ICD-10-CM

## 2016-11-27 LAB — POCT URINE PREGNANCY: PREG TEST UR: POSITIVE — AB

## 2016-11-27 NOTE — Progress Notes (Signed)
Prenatal care

## 2016-11-27 NOTE — Progress Notes (Addendum)
11/27/2016   Chief Complaint: Missed period  Transfer of Care Patient: no  History of Present Illness: Melinda Cortez is a 36 y.o. G2P1001 at 5 weeks 4 days based on Patient's last menstrual period was 10/20/2015 (approximate). with an Estimated Date of Delivery: 07/26/2017, with the above CC. Preg complicated by has Elevated LFTs; Elevated vitamin B12 level; Anxiety and depression; Sinusitis, acute maxillary; and Allergic rhinitis on her problem list..  Her periods were: regular periods every 28 days She was using no method when she conceived.  She has Positive signs or symptoms of nausea/vomiting of pregnancy. She has Negative signs or symptoms of miscarriage or preterm labor She identifies Negative Zika risk factors for her and her partner On any different medications around the time she conceived/early pregnancy: No  History of varicella: Yes   ROS: A 12-point review of systems was performed and negative, except as stated in the above HPI.  OBGYN History: As per HPI. OB History  Gravida Para Term Preterm AB Living  2 1 1     1   SAB TAB Ectopic Multiple Live Births          1    # Outcome Date GA Lbr Len/2nd Weight Sex Delivery Anes PTL Lv  2 Current           1 Term 03/20/11 [redacted]w[redacted]d  7 lb 5 oz (3.317 kg) F Vag-Spont  N LIV      Any issues with any prior pregnancies: no Any prior children are healthy, doing well, without any problems or issues: yes History of pap smears: greater than 1 year ago. Last pap smear normal.  History of STIs: No   Past Medical History: Past Medical History:  Diagnosis Date  . Chronic headaches   . Depression   . Elevated blood pressure Lichen sclerosis     Past Surgical History: Past Surgical History:  Procedure Laterality Date  . TONSILLECTOMY AND ADENOIDECTOMY      Family History:  Family History  Problem Relation Age of Onset  . Alcoholism    . Breast cancer  41    Mothers 1/2 sister  . Lung cancer    . Hypertension    . Mental  illness     She denies any female cancers, bleeding or blood clotting disorders.  She denies any history of mental retardation, birth defects or genetic disorders in her or the FOB's history  Social History:  Social History   Social History  . Marital status: Married    Spouse name: N/A  . Number of children: N/A  . Years of education: N/A   Occupational History  . Not on file.   Social History Main Topics  . Smoking status: Former Research scientist (life sciences)  . Smokeless tobacco: Never Used  . Alcohol use 0.0 oz/week     Comment: rare  . Drug use: No  . Sexual activity: Yes    Birth control/ protection: None   Other Topics Concern  . Not on file   Social History Narrative  . No narrative on file   Any pets in the household: no   Allergy: No Known Allergies  Current Outpatient Medications:  Current Outpatient Prescriptions:  .  buPROPion (WELLBUTRIN XL) 150 MG 24 hr tablet, Take 1 tablet (150 mg total) by mouth daily., Disp: 90 tablet, Rfl: 3 .  clobetasol ointment (TEMOVATE) 0.26 %, Apply 1 application topically 2 (two) times daily., Disp: , Rfl:  .  Prenatal Vit-Fe Fumarate-FA (PRENATAL MULTIVITAMIN) TABS tablet, Take  1 tablet by mouth daily at 12 noon., Disp: , Rfl:  .  sertraline (ZOLOFT) 100 MG tablet, Take 100 mg by mouth daily. Take 0.5 po qd, Disp: , Rfl:    Physical Exam:   BP 114/84   Pulse 64   Wt 218 lb (98.9 kg)   LMP 08/09/2016 (Approximate)   BMI 34.14 kg/m  Body mass index is 34.14 kg/m. Fundal height: not applicable FHTs: not attempted today  Constitutional: Well nourished, well developed female in no acute distress.  Neck:  Supple, normal appearance, and no thyromegaly  Cardiovascular: regular rate and rhythm Respiratory:  Clear to auscultation bilateral. Normal respiratory effort Abdomen: positive bowel sounds and no masses, hernias; diffusely non tender to palpation, non distended Breasts: breasts appear normal, no suspicious masses, no skin or nipple  changes or axillary nodes. Neuro/Psych:  Normal mood and affect.  Skin:  Warm and dry.  Lymphatic:  No inguinal lymphadenopathy.   Pelvic exam: is limited by body habitus EGBUS: within normal limits, Vagina: within normal limits and with no blood in the vault, Cervix: normal appearing cervix without discharge or lesions, closed/long/high, Uterus:  Not palpated, and Adnexa:  not evaluated  Assessment: Ms. Morgenthaler is a 36 y.o. G2P1001 5 weeks 4 days based on Patient's last menstrual period was 08/09/2016 (approximate). with an Estimated Date of Delivery: 07/26/2017, here for prenatal care.  Plan:  No problem-specific Assessment & Plan notes found for this encounter.  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 Amenorrhea  - POCT urine pregnancy  2. Encounter for supervision of other normal pregnancy in first trimester  - Urine Culture  3. 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 BMI 34.0-34.9,adult   5. Screen for sexually transmitted diseases  - Pap IG, Ct-Ng TV HPV-hr  6. Cervical cancer screening  - Pap IG, Ct-Ng TV HPV-hr   The patient has not traveled to a [place] Virus endemic area within the past 6 months, nor has she had unprotected sex with a partner who has travelled to a [place] endemic region within the past 6 months. The patient has been advised to notify us if these factors change any time during this current pregnancy, so adequate testing and monitoring can be  initiated.  Problem list reviewed and updated.  Follow up in 2 weeks for dating scan   Rod Can, CNM

## 2016-11-27 NOTE — Progress Notes (Signed)
Here for NOB. Mild nausea sample of diclegis given. Return in 2 weeks for dating and NOB/early glucola labs

## 2016-11-28 LAB — URINE CULTURE

## 2016-12-02 LAB — PAP IG, CT-NG TV HPV-HR
Chlamydia, Nuc. Acid Amp: NEGATIVE
Gonococcus, Nuc. Acid Amp: NEGATIVE
HPV, HIGH-RISK: NEGATIVE
PAP Smear Comment: 0
TRICH VAG BY NAA: NEGATIVE

## 2016-12-12 ENCOUNTER — Ambulatory Visit (INDEPENDENT_AMBULATORY_CARE_PROVIDER_SITE_OTHER): Payer: 59 | Admitting: Psychology

## 2016-12-12 DIAGNOSIS — F4323 Adjustment disorder with mixed anxiety and depressed mood: Secondary | ICD-10-CM | POA: Diagnosis not present

## 2016-12-16 ENCOUNTER — Other Ambulatory Visit: Payer: Self-pay | Admitting: Certified Nurse Midwife

## 2016-12-16 DIAGNOSIS — Z3491 Encounter for supervision of normal pregnancy, unspecified, first trimester: Secondary | ICD-10-CM

## 2016-12-17 ENCOUNTER — Ambulatory Visit: Payer: 59

## 2016-12-17 ENCOUNTER — Ambulatory Visit (INDEPENDENT_AMBULATORY_CARE_PROVIDER_SITE_OTHER): Payer: 59 | Admitting: Certified Nurse Midwife

## 2016-12-17 ENCOUNTER — Encounter: Payer: Self-pay | Admitting: Certified Nurse Midwife

## 2016-12-17 VITALS — BP 120/80 | HR 60 | Wt 229.0 lb

## 2016-12-17 DIAGNOSIS — O0990 Supervision of high risk pregnancy, unspecified, unspecified trimester: Secondary | ICD-10-CM

## 2016-12-17 DIAGNOSIS — Z113 Encounter for screening for infections with a predominantly sexual mode of transmission: Secondary | ICD-10-CM

## 2016-12-17 DIAGNOSIS — Z131 Encounter for screening for diabetes mellitus: Secondary | ICD-10-CM

## 2016-12-17 DIAGNOSIS — O99210 Obesity complicating pregnancy, unspecified trimester: Secondary | ICD-10-CM

## 2016-12-17 DIAGNOSIS — O09521 Supervision of elderly multigravida, first trimester: Secondary | ICD-10-CM

## 2016-12-17 DIAGNOSIS — Z6834 Body mass index (BMI) 34.0-34.9, adult: Secondary | ICD-10-CM

## 2016-12-17 DIAGNOSIS — Z3A01 Less than 8 weeks gestation of pregnancy: Secondary | ICD-10-CM

## 2016-12-17 DIAGNOSIS — Z3491 Encounter for supervision of normal pregnancy, unspecified, first trimester: Secondary | ICD-10-CM

## 2016-12-17 NOTE — Progress Notes (Unsigned)
Review of ULTRASOUND.    I have personally reviewed images and report of recent ultrasound done at [facility].    Plan of management to be discussed with patient.    Early OB scan, dates 6 2/7 weeks, difficult to discern FHT (due to early gest age)    Plan follow up; see note from [place], MD, [facility], [facility] 12/17/2016  10:27 AM

## 2016-12-17 NOTE — Progress Notes (Addendum)
Dating scan today: CRL 6 wk1 d or 6wk 2 day with no clear FCA. ? Early pregnancy vs nonviable. First positive ICON 3 March 18. Will repeat scan in 1 week. No bleeding. NOB labs and 1 hour GTT today.  Dalia Heading, CNM

## 2016-12-18 LAB — RPR+RH+ABO+RUB AB+AB SCR+CB...
ANTIBODY SCREEN: NEGATIVE
HEMATOCRIT: 37.4 % (ref 34.0–46.6)
HEMOGLOBIN: 12.3 g/dL (ref 11.1–15.9)
HEP B S AG: NEGATIVE
HIV SCREEN 4TH GENERATION: NONREACTIVE
MCH: 30.8 pg (ref 26.6–33.0)
MCHC: 32.9 g/dL (ref 31.5–35.7)
MCV: 94 fL (ref 79–97)
Platelets: 195 10*3/uL (ref 150–379)
RBC: 4 x10E6/uL (ref 3.77–5.28)
RDW: 13.9 % (ref 12.3–15.4)
RH TYPE: POSITIVE
RPR: NONREACTIVE
Rubella Antibodies, IGG: 4.05 index (ref >0.99)
Varicella zoster IgG: 1287 index (ref >165)
WBC: 4.9 10*3/uL (ref 3.4–10.8)

## 2016-12-18 LAB — GLUCOSE, 1 HOUR GESTATIONAL: Gestational Diabetes Screen: 65 mg/dL (ref 65–139)

## 2016-12-20 DIAGNOSIS — O0990 Supervision of high risk pregnancy, unspecified, unspecified trimester: Secondary | ICD-10-CM | POA: Insufficient documentation

## 2016-12-26 ENCOUNTER — Ambulatory Visit (INDEPENDENT_AMBULATORY_CARE_PROVIDER_SITE_OTHER): Payer: 59 | Admitting: Advanced Practice Midwife

## 2016-12-26 ENCOUNTER — Ambulatory Visit (INDEPENDENT_AMBULATORY_CARE_PROVIDER_SITE_OTHER): Payer: 59

## 2016-12-26 ENCOUNTER — Encounter: Payer: 59 | Admitting: Advanced Practice Midwife

## 2016-12-26 ENCOUNTER — Other Ambulatory Visit: Payer: 59

## 2016-12-26 VITALS — BP 116/74 | Wt 233.0 lb

## 2016-12-26 DIAGNOSIS — O09521 Supervision of elderly multigravida, first trimester: Secondary | ICD-10-CM | POA: Diagnosis not present

## 2016-12-26 DIAGNOSIS — O021 Missed abortion: Secondary | ICD-10-CM

## 2016-12-26 DIAGNOSIS — Z3A01 Less than 8 weeks gestation of pregnancy: Secondary | ICD-10-CM

## 2016-12-26 NOTE — Progress Notes (Signed)
Viability scan today

## 2016-12-26 NOTE — Progress Notes (Signed)
No cardiac activity on today's viability scan. Fetus measuring [redacted]w[redacted]d. Had a scan last week which dated similar to this week. Beta quant today and repeat on Monday.

## 2016-12-27 LAB — BETA HCG QUANT (REF LAB): hCG Quant: 40056 m[IU]/mL

## 2016-12-30 ENCOUNTER — Telehealth: Payer: Self-pay

## 2016-12-30 ENCOUNTER — Other Ambulatory Visit: Payer: 59

## 2016-12-30 DIAGNOSIS — O021 Missed abortion: Secondary | ICD-10-CM

## 2016-12-30 NOTE — Telephone Encounter (Signed)
Pt calling. Saw [facility] 3/27, u/s showed [redacted]w[redacted]d.  Saw [facility] last Thurs, f/u u/s showed [redacted]w[redacted]d, no FHT.  JEG told her it could be a missed miscarriage.  Pt had spotting over the weekend and is supposed to come in for bloodwork this am.  Pt wants to know what next steps might be. [phone]  Pt would like to talk c CLG.

## 2016-12-30 NOTE — Telephone Encounter (Signed)
Please advise. Thank you

## 2016-12-30 NOTE — Telephone Encounter (Signed)
Discussed with patient. Will have her come in later this week for another ultrasound and follow up.

## 2016-12-31 LAB — BETA HCG QUANT (REF LAB): hCG Quant: 38518 m[IU]/mL

## 2017-01-02 ENCOUNTER — Other Ambulatory Visit: Payer: Self-pay | Admitting: Certified Nurse Midwife

## 2017-01-02 ENCOUNTER — Ambulatory Visit (INDEPENDENT_AMBULATORY_CARE_PROVIDER_SITE_OTHER): Payer: 59 | Admitting: Certified Nurse Midwife

## 2017-01-02 ENCOUNTER — Ambulatory Visit (INDEPENDENT_AMBULATORY_CARE_PROVIDER_SITE_OTHER): Payer: 59

## 2017-01-02 DIAGNOSIS — Z3687 Encounter for antenatal screening for uncertain dates: Secondary | ICD-10-CM

## 2017-01-02 DIAGNOSIS — Z3A08 8 weeks gestation of pregnancy: Secondary | ICD-10-CM | POA: Diagnosis not present

## 2017-01-02 DIAGNOSIS — Z3491 Encounter for supervision of normal pregnancy, unspecified, first trimester: Secondary | ICD-10-CM

## 2017-01-02 DIAGNOSIS — O021 Missed abortion: Secondary | ICD-10-CM

## 2017-01-02 NOTE — Progress Notes (Signed)
Missed abortion today. CRL still 6 wk (no change in 2 weeks) with no FCA. Has been having some spotting. No abdominal pain. Explained most common etiology for missed abortion is a chromosomal problem. Discussed options including expectant management, medical management (Cytotec) and surgical management (D&C). Discussed risks and benefits of each. Opts for the D&C. Scheduled for 17 August with Dr Georgianne Fick. With preop on 16th August.

## 2017-01-06 ENCOUNTER — Encounter: Payer: Self-pay | Admitting: Obstetrics and Gynecology

## 2017-01-06 ENCOUNTER — Encounter
Admission: RE | Admit: 2017-01-06 | Discharge: 2017-01-06 | Disposition: A | Discharge status: Home / Self Care | Payer: 59 | Source: Ambulatory Visit | Attending: Obstetrics and Gynecology | Admitting: Obstetrics and Gynecology

## 2017-01-06 ENCOUNTER — Ambulatory Visit: Payer: 59 | Admitting: Obstetrics and Gynecology

## 2017-01-06 VITALS — BP 118/76 | HR 73 | Ht 67.0 in | Wt 234.0 lb

## 2017-01-06 DIAGNOSIS — O021 Missed abortion: Secondary | ICD-10-CM

## 2017-01-06 HISTORY — DX: Other complications of anesthesia, initial encounter: T88.59XA

## 2017-01-06 HISTORY — DX: Nausea with vomiting, unspecified: Z98.890

## 2017-01-06 HISTORY — DX: Other specified postprocedural states: R11.2

## 2017-01-06 HISTORY — DX: Adverse effect of unspecified anesthetic, initial encounter: T41.45XA

## 2017-01-06 NOTE — H&P (Signed)
Obstetrics & Gynecology Surgery H&P    Chief Complaint: Scheduled Surgery   History of Present Illness: Patient is a 36 y.o. G2P1001 presenting for scheduled suction D&C, for the treatment or further evaluation of missed abortion.   Prior Treatments prior to proceeding with surgery include: BHCG drop from 40,056 on 12/26/16 to 38,518 on 12/30/16 and ultrasound on  01/02/2017 showing CRL 0.35cm, [redacted]w[redacted]d and YS, unchanged from prior ultrasound 12/17/2016 with CRL 0.54cm [redacted]w[redacted]d and YS  Review of Systems:10 point review of systems  Past Medical History:  Past Medical History:  Diagnosis Date  . Chronic headaches    HORMONAL   . Complication of anesthesia   . Depression   . Elevated blood pressure   . PONV (postoperative nausea and vomiting)     Past Surgical History:  Past Surgical History:  Procedure Laterality Date  . TONSILLECTOMY AND ADENOIDECTOMY  1994    Family History:  Family History  Problem Relation Age of Onset  . Alcoholism    . Breast cancer  41    Mothers 1/2 sister  . Lung cancer    . Hypertension    . Mental illness      Social History:  Social History   Social History  . Marital status: Married    Spouse name: N/A  . Number of children: N/A  . Years of education: N/A   Occupational History  . Not on file.   Social History Main Topics  . Smoking status: Former Smoker    Packs/day: 0.25    Years: 16.00    Types: Cigarettes    Quit date: 01/07/2015  . Smokeless tobacco: Never Used  . Alcohol use 0.0 oz/week     Comment: rare  . Drug use: No  . Sexual activity: Yes    Birth control/ protection: None   Other Topics Concern  . Not on file   Social History Narrative  . No narrative on file    Allergies:  No Known Allergies  Medications: Prior to Admission medications   Medication Sig Start Date End Date Taking? Authorizing Provider  buPROPion (WELLBUTRIN XL) 150 MG 24 hr tablet Take 1 tablet (150 mg total) by mouth daily. Patient  taking differently: Take 150 mg by mouth every morning.  07/25/16  Yes Leone Haven, MD  cetirizine (ZYRTEC) 10 MG tablet Take 10 mg by mouth every morning.    Yes Historical Provider, MD  clobetasol ointment (TEMOVATE) 1.70 % Apply 1 application topically 2 (two) times daily as needed (lichen).    Yes Historical Provider, MD  diphenhydrAMINE (BENADRYL) 25 MG tablet Take 25 mg by mouth at bedtime as needed for sleep.   Yes Historical Provider, MD  diphenhydramine-acetaminophen (TYLENOL PM) 25-500 MG TABS tablet Take 2 tablets by mouth at bedtime as needed (sleep).   Yes Historical Provider, MD  Prenatal Vit-Fe Fumarate-FA (PRENATAL MULTIVITAMIN) TABS tablet Take 1 tablet by mouth daily.    Yes Historical Provider, MD  sertraline (ZOLOFT) 100 MG tablet Take 50 mg by mouth every morning.    Yes Historical Provider, MD  sodium chloride (OCEAN) 0.65 % SOLN nasal spray Place 1 spray into both nostrils as needed for congestion.   Yes Historical Provider, MD    Physical Exam Vitals: Last menstrual period 10/19/2016. General: NAD HEENT: normocephalic, anicteric Pulmonary: No increased work of breathing Cardiovascular: RRR, distal pulses 2+ Abdomen: Soft, non-tender Genitourinary: deferred Extremities: no edema, erythema, or tenderness Neurologic: Grossly intact Psychiatric: mood appropriate,  affect full  Imaging US Ob Comp Less 14 Wks  Result Date: 12/23/2016 Please see Notes or Procedures tab for imaging impression.  "Society of Radiologyst in Ultrasound Guidelines for Transvaginal Ultrasonographic Diagnosis of Early Pregnancy Loss" and adopted in [place] Number 150, May 2015 (reaffirmed 2017) "Early Pregnancy Loss"  - CRL of 47mm or greater and absence of fetal heartbeat  - Mean sac diameter of 24mm or greater and no embryo  - Absence of embryo with a discernable heartbeat 2 week after initial ultrasound showing a gestational sac without a yolk sac  - Absence of embryo  with a discernable heartbeat 11 days after initial ultrasound showing a gestational sac with  yolk sac present   Assessment: 36 y.o. G2P1001 presenting for scheduled suction D&C for missed abortion  Plan: 1) Condolences were offered to the patient and her family.  I stressed that while emotionally difficult, that this did not occur because of an actions or inactions by the patient.  Somewhere between 10-20% of identified first trimester pregnancies will unfortunately end in miscarriage.  Given this relatively high incidence rate, further diagnostic testing such as chromosome analysis is generally not clinically relevant nor recommended.  Although the chromosomal abnormalities have been implicated at rates as high as 70% in some studies, these are generally random and do not infer and increased risk of recurrence with subsequent pregnancies.  However, 3 or more consecutive first trimester losses are relatively uncommon, and these patient generally do benefit from additional work up to determine a potential modifiable etiology.   We briefly discussed management options including expectant management, medical management, and surgical management as well as their relative success rates and complications. Approximately 80% of first trimester miscarriages will pass successfully but may require a time frame of up to 8 weeks ([place] 150 May 2015 "Early Pregnancy Loss").  Medical management using 826mcg of misoprostil administered every 3-hrs as needed for up to 3 doses speeds up the time frame to completion significantly, has literature supporting its use up to 63 days or [redacted]w[redacted]d gestation and results in a passage rate of 84-85% (ACOG Practice Bulletin 143 March 2014 "Medical Management of First-Trimester Abortion").  Dilation and curettage has the highest rate of uterine evacuation, but carries with is operative cost, surgical and anesthetic risk.  While these risk are relatively small they nevertheless  include infection, bleeding, uterine perforation, formation of uterine synechia, and in rare cases death.   We discussed repeat ultrasound and or trending HCG levels if the patient wishes to pursue these prior to making her decision.  Clinically I am confident of the diagnosis, but I do not want any doubts in the patient's mind regarding the plan of management she chooses to adopt. The patient elects to proceed with surgical management for her missed abortion. I have discussed with the patient the indications for the procedure. Included in the discussion were the options of therapy, as wall as their individual risks, benefits, and complications. Ample time was given to answer all questions.   While the incidence is low, the risks from this surgery include, but are not limited to, the risks of anesthesia, hemorrhage, infection, perforation, and injury to adjacent structures including bowel, bladder and blood vessels.   The patient is Rh positive and rhogam is therefore not indicated.    2) Routine postoperative instructions were reviewed with the patient and her family in detail today including the expected length of recovery and likely postoperative course.  The patient  concurred with the proposed plan, giving informed written consent for the surgery today.  Patient instructed on the importance of being NPO after midnight prior to her procedure.  If warranted preoperative prophylactic antibiotics and SCDs ordered on call to the OR to meet SCIP guidelines and adhere to recommendation laid forth in [place] Number 104 May 2009  "Antibiotic Prophylaxis for Gynecologic Procedures".

## 2017-01-06 NOTE — Patient Instructions (Signed)
  Your procedure is scheduled on: 01-07-17 Report to Same Day Surgery 2nd floor medical mall [facility] Entrance-take elevator on left to 2nd floor.  Check in with surgery information desk.) To find out your arrival time please call [phone] between 1PM - 3PM on 01-06-17  Remember: Instructions that are not followed completely may result in serious medical risk, up to and including death, or upon the discretion of your surgeon and anesthesiologist your surgery may need to be rescheduled.    _x___ 1. Do not eat food or drink liquids after midnight. No gum chewing or  hard candies.     __x__ 2. No Alcohol for 24 hours before or after surgery.   __x__3. No Smoking for 24 prior to surgery.   ____  4. Bring all medications with you on the day of surgery if instructed.    __x__ 5. Notify your doctor if there is any change in your medical condition     (cold, fever, infections).     Do not wear jewelry, make-up, hairpins, clips or nail polish.  Do not wear lotions, powders, or perfumes. You may wear deodorant.  Do not shave 48 hours prior to surgery. Men may shave face and neck.  Do not bring valuables to the hospital.    [facility] is not responsible for any belongings or valuables.               Contacts, dentures or bridgework may not be worn into surgery.  Leave your suitcase in the car. After surgery it may be brought to your room.  For patients admitted to the hospital, discharge time is determined by your treatment team.   Patients discharged the day of surgery will not be allowed to drive home.  You will need someone to drive you home and stay with you the night of your procedure.    Please read over the following fact sheets that you were given:   [facility] Preparing for Surgery and or MRSA Information   _x___ Take anti-hypertensive (unless it includes a diuretic), cardiac, seizure, asthma,     anti-reflux and psychiatric medicines. These include:  1. WELLBUTRIN  2.  SERTRALINE  3.  4.  5.  6.  ____Fleets enema or Magnesium Citrate as directed.   ____ Use CHG Soap or sage wipes as directed on instruction sheet   ____ Use inhalers on the day of surgery and bring to hospital day of surgery  ____ Stop Metformin and Janumet 2 days prior to surgery.    ____ Take 1/2 of usual insulin dose the night before surgery and none on the morning     surgery.   ____ Follow recommendations from Cardiologist, Pulmonologist or PCP regarding stopping Aspirin, Coumadin, Pllavix ,Eliquis, Effient, or Pradaxa, and Pletal.  X____Stop Anti-inflammatories such as Advil, Aleve, Ibuprofen, Motrin, Naproxen, Naprosyn, Goodies powders or aspirin products NOW-OK to take Tylenol    ____ Stop supplements until after surgery.     ____ Bring C-Pap to the hospital.

## 2017-01-07 ENCOUNTER — Ambulatory Visit
Admission: RE | Admit: 2017-01-07 | Discharge: 2017-01-07 | Disposition: A | Discharge status: Home / Self Care | Payer: 59 | Source: Ambulatory Visit | Attending: Obstetrics and Gynecology | Admitting: Obstetrics and Gynecology

## 2017-01-07 ENCOUNTER — Ambulatory Visit: Payer: 59 | Admitting: Anesthesiology

## 2017-01-07 ENCOUNTER — Encounter: Payer: 59 | Admitting: Obstetrics and Gynecology

## 2017-01-07 ENCOUNTER — Encounter
Admission: RE | Disposition: A | Discharge status: Home / Self Care | Payer: Self-pay | Source: Ambulatory Visit | Attending: Obstetrics and Gynecology

## 2017-01-07 DIAGNOSIS — Z79899 Other long term (current) drug therapy: Secondary | ICD-10-CM | POA: Insufficient documentation

## 2017-01-07 DIAGNOSIS — Z87891 Personal history of nicotine dependence: Secondary | ICD-10-CM | POA: Insufficient documentation

## 2017-01-07 DIAGNOSIS — O021 Missed abortion: Secondary | ICD-10-CM | POA: Insufficient documentation

## 2017-01-07 DIAGNOSIS — Z9889 Other specified postprocedural states: Secondary | ICD-10-CM

## 2017-01-07 DIAGNOSIS — F329 Major depressive disorder, single episode, unspecified: Secondary | ICD-10-CM | POA: Insufficient documentation

## 2017-01-07 HISTORY — PX: DILATION AND EVACUATION: SHX1459

## 2017-01-07 LAB — CBC
HCT: 37.2 % (ref 35.0–47.0)
HEMOGLOBIN: 12.7 g/dL (ref 12.0–16.0)
MCH: 31.4 pg (ref 26.0–34.0)
MCHC: 34.1 g/dL (ref 32.0–36.0)
MCV: 92 fL (ref 80.0–100.0)
PLATELETS: 183 10*3/uL (ref 150–440)
RBC: 4.04 MIL/uL (ref 3.80–5.20)
RDW: 13.1 % (ref 11.5–14.5)
WBC: 5 10*3/uL (ref 3.6–11.0)

## 2017-01-07 LAB — TYPE AND SCREEN
ABO/RH(D): O POS
Antibody Screen: NEGATIVE

## 2017-01-07 LAB — ABO/RH: ABO/RH(D): O POS

## 2017-01-07 SURGERY — DILATION AND EVACUATION, UTERUS
Anesthesia: General

## 2017-01-07 MED ORDER — FENTANYL CITRATE (PF) 100 MCG/2ML IJ SOLN
INTRAMUSCULAR | Status: AC
  Filled 2017-01-07: qty 2

## 2017-01-07 MED ORDER — OXYCODONE-ACETAMINOPHEN 5-325 MG PO TABS: 1.0000 | ORAL_TABLET | ORAL | 0 refills | Status: DC | PRN

## 2017-01-07 MED ORDER — PROPOFOL 10 MG/ML IV BOLUS
INTRAVENOUS | Status: DC | PRN
  Administered 2017-01-07: 200 mg via INTRAVENOUS

## 2017-01-07 MED ORDER — OXYCODONE HCL 5 MG PO TABS
5.0000 mg | ORAL_TABLET | Freq: Once | ORAL | Status: AC | PRN
  Administered 2017-01-07: 5 mg via ORAL

## 2017-01-07 MED ORDER — LIDOCAINE HCL (PF) 2 % IJ SOLN
INTRAMUSCULAR | Status: AC
  Filled 2017-01-07: qty 2

## 2017-01-07 MED ORDER — LACTATED RINGERS IV SOLN
INTRAVENOUS | Status: DC
  Administered 2017-01-07: 11:00:00 via INTRAVENOUS

## 2017-01-07 MED ORDER — FAMOTIDINE 20 MG PO TABS
20.0000 mg | ORAL_TABLET | Freq: Once | ORAL | Status: AC
  Administered 2017-01-07: 20 mg via ORAL

## 2017-01-07 MED ORDER — ONDANSETRON HCL 4 MG/2ML IJ SOLN
INTRAMUSCULAR | Status: DC | PRN
  Administered 2017-01-07: 4 mg via INTRAVENOUS

## 2017-01-07 MED ORDER — IBUPROFEN 600 MG PO TABS: 600.0000 mg | ORAL_TABLET | Freq: Four times a day (QID) | ORAL | 3 refills | Status: DC | PRN

## 2017-01-07 MED ORDER — PROPOFOL 10 MG/ML IV BOLUS
INTRAVENOUS | Status: AC
  Filled 2017-01-07: qty 20

## 2017-01-07 MED ORDER — SILVER NITRATE-POT NITRATE 75-25 % EX MISC
CUTANEOUS | Status: AC
  Filled 2017-01-07: qty 2

## 2017-01-07 MED ORDER — DEXAMETHASONE SODIUM PHOSPHATE 10 MG/ML IJ SOLN
INTRAMUSCULAR | Status: DC | PRN
  Administered 2017-01-07: 5 mg via INTRAVENOUS

## 2017-01-07 MED ORDER — FAMOTIDINE 20 MG PO TABS
ORAL_TABLET | ORAL | Status: AC
  Administered 2017-01-07: 20 mg via ORAL
  Filled 2017-01-07: qty 1

## 2017-01-07 MED ORDER — MIDAZOLAM HCL 2 MG/2ML IJ SOLN
INTRAMUSCULAR | Status: DC | PRN
  Administered 2017-01-07: 2 mg via INTRAVENOUS

## 2017-01-07 MED ORDER — ACETAMINOPHEN 10 MG/ML IV SOLN
INTRAVENOUS | Status: DC | PRN
  Administered 2017-01-07: 1000 mg via INTRAVENOUS

## 2017-01-07 MED ORDER — DEXTROSE 5 % IV SOLN
100.0000 mg | Freq: Once | INTRAVENOUS | Status: AC
  Administered 2017-01-07: 100 mg via INTRAVENOUS
  Filled 2017-01-07: qty 100

## 2017-01-07 MED ORDER — OXYCODONE HCL 5 MG PO TABS
ORAL_TABLET | ORAL | Status: AC
  Filled 2017-01-07: qty 1

## 2017-01-07 MED ORDER — ACETAMINOPHEN 10 MG/ML IV SOLN
INTRAVENOUS | Status: AC
  Filled 2017-01-07: qty 100

## 2017-01-07 MED ORDER — METRONIDAZOLE 500 MG PO TABS: 500.0000 mg | ORAL_TABLET | Freq: Two times a day (BID) | ORAL | 0 refills | Status: AC

## 2017-01-07 MED ORDER — MIDAZOLAM HCL 2 MG/2ML IJ SOLN
INTRAMUSCULAR | Status: AC
Start: 2017-01-07 — End: 2017-01-07
  Filled 2017-01-07: qty 2

## 2017-01-07 MED ORDER — MEPERIDINE HCL 50 MG/ML IJ SOLN: 6.2500 mg | INTRAMUSCULAR | Status: DC | PRN

## 2017-01-07 MED ORDER — FENTANYL CITRATE (PF) 100 MCG/2ML IJ SOLN: 25.0000 ug | INTRAMUSCULAR | Status: DC | PRN

## 2017-01-07 MED ORDER — DEXAMETHASONE SODIUM PHOSPHATE 10 MG/ML IJ SOLN
INTRAMUSCULAR | Status: AC
  Filled 2017-01-07: qty 1

## 2017-01-07 MED ORDER — OXYCODONE HCL 5 MG/5ML PO SOLN: 5.0000 mg | Freq: Once | ORAL | Status: AC | PRN

## 2017-01-07 MED ORDER — LIDOCAINE HCL (CARDIAC) 20 MG/ML IV SOLN
INTRAVENOUS | Status: DC | PRN
  Administered 2017-01-07: 100 mg via INTRAVENOUS

## 2017-01-07 MED ORDER — PROMETHAZINE HCL 25 MG/ML IJ SOLN: 6.2500 mg | INTRAMUSCULAR | Status: DC | PRN

## 2017-01-07 MED ORDER — DOXYCYCLINE HYCLATE 100 MG PO TABS
200.0000 mg | ORAL_TABLET | Freq: Once | ORAL | Status: AC
  Administered 2017-01-07: 200 mg via ORAL
  Filled 2017-01-07: qty 2

## 2017-01-07 MED ORDER — FENTANYL CITRATE (PF) 100 MCG/2ML IJ SOLN
INTRAMUSCULAR | Status: DC | PRN
  Administered 2017-01-07 (×2): 50 ug via INTRAVENOUS

## 2017-01-07 MED ORDER — ONDANSETRON HCL 4 MG/2ML IJ SOLN
INTRAMUSCULAR | Status: AC
  Filled 2017-01-07: qty 2

## 2017-01-07 SURGICAL SUPPLY — 18 items
CATH ROBINSON RED A/P 16FR (CATHETERS) ×2 IMPLANT
FILTER UTR ASPR SPEC (MISCELLANEOUS) ×1 IMPLANT
FLTR UTR ASPR SPEC (MISCELLANEOUS) ×2
GLOVE BIO SURGEON STRL SZ7 (GLOVE) ×2 IMPLANT
GOWN STRL REUS W/ TWL LRG LVL3 (GOWN DISPOSABLE) ×2 IMPLANT
GOWN STRL REUS W/TWL LRG LVL3 (GOWN DISPOSABLE) ×2
KIT BERKELEY 1ST TRIMESTER 3/8 (MISCELLANEOUS) ×2 IMPLANT
KIT RM TURNOVER CYSTO AR (KITS) ×2 IMPLANT
NS IRRIG 500ML POUR BTL (IV SOLUTION) ×2 IMPLANT
PACK DNC HYST (MISCELLANEOUS) ×2 IMPLANT
PAD OB MATERNITY 4.3X12.25 (PERSONAL CARE ITEMS) ×2 IMPLANT
PAD PREP 24X41 OB/GYN DISP (PERSONAL CARE ITEMS) ×2 IMPLANT
SET BERKELEY SUCTION TUBING (SUCTIONS) ×2 IMPLANT
TOWEL OR 17X26 4PK STRL BLUE (TOWEL DISPOSABLE) IMPLANT
VACURETTE 10 RIGID CVD (CANNULA) IMPLANT
VACURETTE 12 RIGID CVD (CANNULA) IMPLANT
VACURETTE 8 RIGID CVD (CANNULA) IMPLANT
VACURETTE 8MM F TIP (MISCELLANEOUS) IMPLANT

## 2017-01-07 NOTE — Anesthesia Post-op Follow-up Note (Cosign Needed)
Anesthesia QCDR form completed.        

## 2017-01-07 NOTE — Anesthesia Procedure Notes (Signed)
Procedure Name: LMA Insertion Date/Time: 01/07/2017 11:46 AM Performed by: Aline Brochure Pre-anesthesia Checklist: Patient identified, Emergency Drugs available, Suction available and Patient being monitored Patient Re-evaluated:Patient Re-evaluated prior to inductionOxygen Delivery Method: Circle system utilized Preoxygenation: Pre-oxygenation with 100% oxygen Intubation Type: IV induction Ventilation: Mask ventilation without difficulty LMA: LMA inserted LMA Size: 3.5 Number of attempts: 1 Placement Confirmation: positive ETCO2 and breath sounds checked- equal and bilateral Tube secured with: Tape Dental Injury: Teeth and Oropharynx as per pre-operative assessment

## 2017-01-07 NOTE — Discharge Instructions (Signed)

## 2017-01-07 NOTE — Anesthesia Preprocedure Evaluation (Signed)
Anesthesia Evaluation  Patient identified by MRN, date of birth, ID band Patient awake    Reviewed: Allergy & Precautions, NPO status , Patient's Chart, lab work & pertinent test results  History of Anesthesia Complications (+) PONV and history of anesthetic complications  Airway Mallampati: II  TM Distance: >3 FB Neck ROM: Full    Dental no notable dental hx.    Pulmonary neg sleep apnea, neg COPD, former smoker,    breath sounds clear to auscultation- rhonchi (-) wheezing      Cardiovascular Exercise Tolerance: Good (-) hypertension(-) CAD and (-) Past MI  Rhythm:Regular Rate:Normal - Systolic murmurs and - Diastolic murmurs    Neuro/Psych  Headaches, PSYCHIATRIC DISORDERS Depression    GI/Hepatic negative GI ROS, Neg liver ROS,   Endo/Other  negative endocrine ROSneg diabetes  Renal/GU negative Renal ROS     Musculoskeletal negative musculoskeletal ROS (+)   Abdominal (+) + obese,   Peds  Hematology negative hematology ROS (+)   Anesthesia Other Findings Past Medical History: No date: Chronic headaches     Comment: HORMONAL  No date: Complication of anesthesia No date: Depression No date: Elevated blood pressure No date: PONV (postoperative nausea and vomiting)   Reproductive/Obstetrics                             Anesthesia Physical Anesthesia Plan  ASA: II  Anesthesia Plan: General   Post-op Pain Management:    Induction: Intravenous  Airway Management Planned: LMA  Additional Equipment:   Intra-op Plan:   Post-operative Plan:   Informed Consent: I have reviewed the patients History and Physical, chart, labs and discussed the procedure including the risks, benefits and alternatives for the proposed anesthesia with the patient or authorized representative who has indicated his/her understanding and acceptance.   Dental advisory given  Plan Discussed with: CRNA and  Anesthesiologist  Anesthesia Plan Comments:         Anesthesia Quick Evaluation

## 2017-01-07 NOTE — Transfer of Care (Signed)
Immediate Anesthesia Transfer of Care Note  Patient: Melinda Cortez  Procedure(s) Performed: Procedure(s): DILATATION AND EVACUATION (N/A)  Patient Location: PACU  Anesthesia Type:General  Level of Consciousness: awake  Airway & Oxygen Therapy: Patient connected to face mask oxygen  Post-op Assessment: Post -op Vital signs reviewed and stable  Post vital signs: stable  Last Vitals:  Vitals:   01/07/17 1102 01/07/17 1218  BP: 131/82 (!) 108/52  Pulse: 78 80  Resp: 16 10  Temp: 36.8 C 37 C    Last Pain:  Vitals:   01/07/17 1102  TempSrc: Tympanic  PainSc:          Complications: No apparent anesthesia complications

## 2017-01-07 NOTE — Op Note (Signed)
Patient Name: Melinda Cortez Date of Procedure: 01/07/17  Preoperative Diagnosis: 1) 36 y.o. with 6 week missed abortion  Postoperative Diagnosis: 1) 36 y.o. with 6 week missed abortion  Operation Performed: Suction dilation and curettage  Indication: Patient with dropping HCG levels ultrasound 14 days apart showing fetal pole and yolk sac with no fetal heart tones.  Anesthesia: General  Primary Surgeon: Malachy Mood, MD  Assistant: none  Preoperative Antibiotics: n100mg  of doxycyline  Estimated Blood Loss: 156mL  Urine Output:: 59mL straight cath  Drains or Tubes: none  Implants: none  Specimens Removed: Products of conception  Complications: none  Intraoperative Findings:  Non-dilated cervix, non-enlarged anteverted uterus  Patient Condition: stable  Procedure in Detail:  Patient was taken to the operating room were she was administered general endotracheal anesthesia.  She was positioned in the dorsal lithotomy position utilizing Allen stirups, prepped and draped in the usual sterile fashion.  Uterus was noted to be non-enlarged in size, anteverted.   Prior to proceeding with the case a time out was performed.  Attention was turned to the patient's pelvis.  A red rubber catheter was used to empty the patient's bladder.  An operative speculum was placed to allow visualization of the cervix.  The anterior lip of the cervix was grasped with a single tooth tenaculum and the cervix was sequentially dilated using pratt dilators.  A size 8 flexible suction curette was then advanced to the uterine fundus.  Several passes were undertaken to clear the uterine contents.  Sharp curettage was then  performed nothing good uterine cry throughout the cavity.  A final pass of the suction curette was then undertaken.  The resulting specimen was sent to pathology.    The single tooth tenaculum was removed from the cervix.  The tenaculum sites and cervix were noted to be  Hemostatic  before removing the operative speculum.  Sponge needle and instrument counts were corrects times two.  The patient tolerated the procedure well and was taken to the recovery room in stable condition.

## 2017-01-07 NOTE — Anesthesia Postprocedure Evaluation (Signed)
Anesthesia Post Note  Patient: [name]  Procedure(s) Performed: Procedure(s) (LRB): DILATATION AND EVACUATION (N/A)  Patient location during evaluation: PACU Anesthesia Type: General Level of consciousness: awake and alert and oriented Pain management: pain level controlled Vital Signs Assessment: post-procedure vital signs reviewed and stable Respiratory status: spontaneous breathing, nonlabored ventilation and respiratory function stable Cardiovascular status: blood pressure returned to baseline and stable Postop Assessment: no signs of nausea or vomiting Anesthetic complications: no     Last Vitals:  Vitals:   01/07/17 1218 01/07/17 1233  BP: (!) 108/52 128/85  Pulse: 80 63  Resp: 10 18  Temp: 37 C     Last Pain:  Vitals:   01/07/17 1233  TempSrc:   PainSc: 0-No pain                 Madylyn Insco

## 2017-01-08 ENCOUNTER — Encounter: Payer: Self-pay | Admitting: Obstetrics and Gynecology

## 2017-01-08 LAB — SURGICAL PATHOLOGY

## 2017-01-14 ENCOUNTER — Ambulatory Visit (INDEPENDENT_AMBULATORY_CARE_PROVIDER_SITE_OTHER): Payer: 59 | Admitting: Psychology

## 2017-01-14 ENCOUNTER — Encounter: Payer: Self-pay | Admitting: Family Medicine

## 2017-01-14 ENCOUNTER — Ambulatory Visit (INDEPENDENT_AMBULATORY_CARE_PROVIDER_SITE_OTHER): Payer: 59 | Admitting: Family Medicine

## 2017-01-14 DIAGNOSIS — R030 Elevated blood-pressure reading, without diagnosis of hypertension: Secondary | ICD-10-CM | POA: Diagnosis not present

## 2017-01-14 DIAGNOSIS — F329 Major depressive disorder, single episode, unspecified: Secondary | ICD-10-CM | POA: Diagnosis not present

## 2017-01-14 DIAGNOSIS — F4323 Adjustment disorder with mixed anxiety and depressed mood: Secondary | ICD-10-CM

## 2017-01-14 DIAGNOSIS — F419 Anxiety disorder, unspecified: Secondary | ICD-10-CM

## 2017-01-14 DIAGNOSIS — O021 Missed abortion: Secondary | ICD-10-CM | POA: Diagnosis not present

## 2017-01-14 NOTE — Patient Instructions (Signed)
Nice to see you. Please continue Wellbutrin and Zoloft. Please let us know when you need refills. Please continue with the counselor. Please see gynecology as planned.

## 2017-01-14 NOTE — Assessment & Plan Note (Signed)
Relatively stable given recent missed abortion and D&C. She'll continue her current medications and continue counseling.

## 2017-01-14 NOTE — Assessment & Plan Note (Signed)
Doing relatively well after recent D&C. She'll see gynecology as scheduled tomorrow.

## 2017-01-14 NOTE — Assessment & Plan Note (Addendum)
Has not been elevated previously. Elevated on recheck is well. She will check at home as well. Discussed goal blood pressure. We'll plan on rechecking in a month or so.

## 2017-01-14 NOTE — Progress Notes (Signed)
Pre visit review using our clinic review tool, if applicable. No additional management support is needed unless otherwise documented below in the visit note. 

## 2017-01-14 NOTE — Progress Notes (Signed)
  Tommi Rumps, MD Phone: [phone]  Melinda Cortez is a 36 y.o. female who presents today for follow-up.  Patient recently underwent D&C for missed abortion. Had this last Tuesday. Follows with gynecology tomorrow. Notes occasional cramping discomfort though not that often. Notes no abdominal pain. She is using oxycodone sparingly and taking a stool softener with it.  Has been dealing with this quite well given the circumstances. She feels as though the Zoloft and Wellbutrin have been significantly beneficial. She notes no SI. She is going to counseling this afternoon. Does have support in place.  Blood pressure elevated diastolically today. No history of hypertension. No chest pain or shortness of breath.  PMH: former smoker   ROS see history of present illness  Objective  Physical Exam Vitals:   01/14/17 0814  BP: (!) 128/96  Pulse: 76  Temp: 98.3 F (36.8 C)    BP Readings from Last 3 Encounters:  01/14/17 (!) 128/96  01/07/17 140/82  01/06/17 118/76   Wt Readings from Last 3 Encounters:  01/14/17 236 lb 6.4 oz (107.2 kg)  01/07/17 234 lb (106.1 kg)  01/06/17 234 lb (106.1 kg)    Physical Exam  Constitutional: No distress.  Cardiovascular: Normal rate, regular rhythm and normal heart sounds.   Pulmonary/Chest: Effort normal and breath sounds normal.  Abdominal: Soft. Bowel sounds are normal. She exhibits no distension. There is no tenderness.  Neurological: She is alert. Gait normal.  Skin: Skin is warm and dry. She is not diaphoretic.     Assessment/Plan: Please see individual problem list.  Anxiety and depression Relatively stable given recent missed abortion and D&C. She'll continue her current medications and continue counseling.  Missed abortion Doing relatively well after recent D&C. She'll see gynecology as scheduled tomorrow.  Elevated BP without diagnosis of hypertension Has not been elevated previously. Elevated on recheck is well. We'll  plan on rechecking in a month or so.   Tommi Rumps, MD [place]

## 2017-01-15 ENCOUNTER — Ambulatory Visit (INDEPENDENT_AMBULATORY_CARE_PROVIDER_SITE_OTHER): Payer: 59 | Admitting: Obstetrics and Gynecology

## 2017-01-15 VITALS — BP 116/94 | HR 66 | Wt 238.0 lb

## 2017-01-15 DIAGNOSIS — Z4889 Encounter for other specified surgical aftercare: Secondary | ICD-10-CM

## 2017-01-15 DIAGNOSIS — Z538 Procedure and treatment not carried out for other reasons: Secondary | ICD-10-CM

## 2017-01-15 NOTE — Progress Notes (Signed)
      Postoperative Follow-up Patient presents post op from suction D&C 1weeks ago for missed abortion.  Subjective: Patient reports marked improvement in her preop symptoms. Eating a regular diet without difficulty. The patient is not having any pain.  Activity: normal activities of daily living.  Objective: There were no vitals filed for this visit.  Gen: NAD Pulm: no increased work of breathing Psych: mood appropriate, affect full Assessment: 36 y.o. s/p suction D&C stable  Plan: Patient has done well after surgery with no apparent complications.  I have discussed the post-operative course to date, and the expected progress moving forward.  The patient understands what complications to be concerned about.  I will see the patient in routine follow up, or sooner if needed.    Activity plan: No restriction.  Malachy Mood 01/15/2017, 10:13 PM

## 2017-01-22 NOTE — Progress Notes (Signed)
Patient not seen was called to OR

## 2017-02-11 ENCOUNTER — Ambulatory Visit (INDEPENDENT_AMBULATORY_CARE_PROVIDER_SITE_OTHER): Payer: 59 | Admitting: Psychology

## 2017-02-11 DIAGNOSIS — F4323 Adjustment disorder with mixed anxiety and depressed mood: Secondary | ICD-10-CM

## 2017-02-18 ENCOUNTER — Ambulatory Visit (INDEPENDENT_AMBULATORY_CARE_PROVIDER_SITE_OTHER): Payer: 59 | Admitting: Obstetrics and Gynecology

## 2017-02-18 ENCOUNTER — Encounter: Payer: Self-pay | Admitting: Obstetrics and Gynecology

## 2017-02-18 VITALS — BP 118/72 | HR 57 | Ht 66.0 in | Wt 228.0 lb

## 2017-02-18 DIAGNOSIS — Z4889 Encounter for other specified surgical aftercare: Secondary | ICD-10-CM

## 2017-02-18 DIAGNOSIS — O021 Missed abortion: Secondary | ICD-10-CM

## 2017-02-18 NOTE — Progress Notes (Signed)
      Postoperative Follow-up Patient presents post op from suction D&C 6weeks ago for missed abortion.  Subjective: Patient reports marked improvement in her preop symptoms. Eating a regular diet without difficulty. The patient is not having any pain.  Activity: normal activities of daily living.  Mood has been appropriate  Objective: Vitals:   02/18/17 0902  BP: 118/72  Pulse: (!) 57   Gen: NAD Abdomen: soft, non-tender GU: normal external female genitalia, uterus non-enlarged, cervix closed Ext: no edema  Assessment: 36 y.o. s/p suction D&C stable  Plan: Patient has done well after surgery with no apparent complications.  I have discussed the post-operative course to date, and the expected progress moving forward.  The patient understands what complications to be concerned about.  I will see the patient in routine follow up, or sooner if needed.    Activity plan: No restriction.  Malachy Mood 02/18/2017, 9:15 AM

## 2017-03-04 ENCOUNTER — Encounter: Payer: Self-pay | Admitting: Family Medicine

## 2017-03-04 ENCOUNTER — Ambulatory Visit (INDEPENDENT_AMBULATORY_CARE_PROVIDER_SITE_OTHER): Payer: 59 | Admitting: Family Medicine

## 2017-03-04 DIAGNOSIS — R030 Elevated blood-pressure reading, without diagnosis of hypertension: Secondary | ICD-10-CM

## 2017-03-04 DIAGNOSIS — G43009 Migraine without aura, not intractable, without status migrainosus: Secondary | ICD-10-CM

## 2017-03-04 DIAGNOSIS — G43909 Migraine, unspecified, not intractable, without status migrainosus: Secondary | ICD-10-CM | POA: Insufficient documentation

## 2017-03-04 NOTE — Patient Instructions (Signed)
Nice to see you. Please continue to monitor your blood pressure at home. Your goal is less than 130/80. Please monitor your headaches as well. If needed you can try Excedrin Migraine, ibuprofen, or Tylenol. If you're actively trying to get pregnant it may be best to try Tylenol.

## 2017-03-04 NOTE — Assessment & Plan Note (Signed)
Improved on recheck. Has been normal at gynecologist office. Discussed continuing to monitor at home. Recheck at her next visit.

## 2017-03-04 NOTE — Assessment & Plan Note (Addendum)
Patient with headaches that sound to be migraines. Occur only in the first several days of her menstrual cycle. Somewhat responds to ibuprofen or Excedrin. Discussed options of monitoring versus starting medication for this. She is planning on getting pregnant again in the future and is hesitant to start on any medication currently. Discussed potential risk of ibuprofen and Excedrin if she were to become pregnant. Encouraged her to try Tylenol if she develops a headache all trying to get pregnant. Discussed continuing to monitor. If becomes more bothersome she'll let us know.

## 2017-03-04 NOTE — Progress Notes (Signed)
  Tommi Rumps, MD Phone: [phone]  Melinda Cortez is a 36 y.o. female who presents today for follow-up.  Elevated blood pressure: Patient notes that it has been checked on several occasions at the gynecologist office and has been normal. Diastolically elevated here. Does note she checks it at home and is occasionally elevated as well. No chest pain, shortness of breath, or edema. She's never been on medication for blood pressure.  She notes menstrually related migraine-like headaches. Typically occurs behind her left eye. Typically occurs over the first 2 days of her menstrual cycle. Has photophobia and phonophobia with it. No aura. No numbness, weakness, or vision changes. They have tried OCPs and an IUD in the past and that did not help. Does respond somewhat to Excedrin and ibuprofen.  PMH: Former smoker   ROS see history of present illness  Objective  Physical Exam Vitals:   03/04/17 1058 03/04/17 1117  BP: (!) 128/92 114/82  Pulse: 67   Temp: 98.2 F (36.8 C)     BP Readings from Last 3 Encounters:  03/04/17 114/82  02/18/17 118/72  01/15/17 (!) 116/94   Wt Readings from Last 3 Encounters:  03/04/17 229 lb 12.8 oz (104.2 kg)  02/18/17 228 lb (103.4 kg)  01/15/17 238 lb (108 kg)    Physical Exam  Constitutional: No distress.  HENT:  Head: Normocephalic and atraumatic.  Mouth/Throat: Oropharynx is clear and moist. No oropharyngeal exudate.  Eyes: Conjunctivae are normal. Pupils are equal, round, and reactive to light.  Cardiovascular: Normal rate, regular rhythm and normal heart sounds.   Pulmonary/Chest: Effort normal and breath sounds normal.  Musculoskeletal: She exhibits no edema.  Neurological: She is alert. Gait normal.  CN 2-12 intact, 5/5 strength in bilateral biceps, triceps, grip, quads, hamstrings, plantar and dorsiflexion, sensation to light touch intact in bilateral UE and LE, normal gait, 2+ patellar reflexes  Skin: She is not diaphoretic.      Assessment/Plan: Please see individual problem list.  Migraine Patient with headaches that sound to be migraines. Occur only in the first several days of her menstrual cycle. Somewhat responds to ibuprofen or Excedrin. Discussed options of monitoring versus starting medication for this. She is planning on getting pregnant again in the future and is hesitant to start on any medication currently. Discussed potential risk of ibuprofen and Excedrin if she were to become pregnant. Encouraged her to try Tylenol if she develops a headache all trying to get pregnant. Discussed continuing to monitor. If becomes more bothersome she'll let us know.  Elevated BP without diagnosis of hypertension Improved on recheck. Has been normal at gynecologist office. Discussed continuing to monitor at home. Recheck at her next visit.  Tommi Rumps, MD [place]

## 2017-03-11 ENCOUNTER — Ambulatory Visit (INDEPENDENT_AMBULATORY_CARE_PROVIDER_SITE_OTHER): Payer: 59 | Admitting: Psychology

## 2017-03-11 DIAGNOSIS — F4323 Adjustment disorder with mixed anxiety and depressed mood: Secondary | ICD-10-CM | POA: Diagnosis not present

## 2017-03-28 ENCOUNTER — Telehealth: Payer: Self-pay | Admitting: Obstetrics & Gynecology

## 2017-03-28 ENCOUNTER — Encounter: Payer: Self-pay | Admitting: Obstetrics and Gynecology

## 2017-03-28 NOTE — Telephone Encounter (Signed)
Per patient had a positive Pregnancy test. Pt is requesting to be brought in more sooner than the schedule date 04/17/17 due to History. Please advise if patient needs to be double booked.

## 2017-03-28 NOTE — Telephone Encounter (Signed)
ok 

## 2017-03-28 NOTE — Telephone Encounter (Signed)
Forwarding message to Dr. Glennon Mac about work in

## 2017-03-29 ENCOUNTER — Encounter: Payer: Self-pay | Admitting: Family Medicine

## 2017-03-31 NOTE — Telephone Encounter (Signed)
Looks like she has an appt with [facility] on 7/11. I think that should be fine.

## 2017-04-02 ENCOUNTER — Ambulatory Visit (INDEPENDENT_AMBULATORY_CARE_PROVIDER_SITE_OTHER): Payer: 59 | Admitting: Certified Nurse Midwife

## 2017-04-02 ENCOUNTER — Encounter: Payer: Self-pay | Admitting: Certified Nurse Midwife

## 2017-04-02 ENCOUNTER — Ambulatory Visit (INDEPENDENT_AMBULATORY_CARE_PROVIDER_SITE_OTHER): Payer: 59

## 2017-04-02 ENCOUNTER — Other Ambulatory Visit: Payer: Self-pay | Admitting: Certified Nurse Midwife

## 2017-04-02 VITALS — BP 110/70 | Wt 238.0 lb

## 2017-04-02 DIAGNOSIS — E669 Obesity, unspecified: Secondary | ICD-10-CM

## 2017-04-02 DIAGNOSIS — Z131 Encounter for screening for diabetes mellitus: Secondary | ICD-10-CM

## 2017-04-02 DIAGNOSIS — O09529 Supervision of elderly multigravida, unspecified trimester: Secondary | ICD-10-CM

## 2017-04-02 DIAGNOSIS — Z0189 Encounter for other specified special examinations: Secondary | ICD-10-CM

## 2017-04-02 DIAGNOSIS — Z362 Encounter for other antenatal screening follow-up: Secondary | ICD-10-CM | POA: Diagnosis not present

## 2017-04-02 DIAGNOSIS — F419 Anxiety disorder, unspecified: Secondary | ICD-10-CM

## 2017-04-02 DIAGNOSIS — Z113 Encounter for screening for infections with a predominantly sexual mode of transmission: Secondary | ICD-10-CM

## 2017-04-02 DIAGNOSIS — F329 Major depressive disorder, single episode, unspecified: Secondary | ICD-10-CM

## 2017-04-02 DIAGNOSIS — Z13220 Encounter for screening for lipoid disorders: Secondary | ICD-10-CM

## 2017-04-02 DIAGNOSIS — O99210 Obesity complicating pregnancy, unspecified trimester: Secondary | ICD-10-CM

## 2017-04-02 DIAGNOSIS — Z3A01 Less than 8 weeks gestation of pregnancy: Secondary | ICD-10-CM

## 2017-04-02 MED ORDER — FOLIC ACID 1 MG PO TABS: 1.0000 mg | ORAL_TABLET | Freq: Every day | ORAL | 3 refills | Status: DC

## 2017-04-02 NOTE — Progress Notes (Signed)
New Obstetric Patient H&P    Chief Complaint: "Desires prenatal care"   History of Present Illness: Patient is a 36 y.o. G3P1011 Not Hispanic or Latino female, LMP 02/10/2017 presents with amenorrhea and positive home pregnancy test. Based on her  LMP, her EDD is Estimated Date of Delivery: 11/17/17 and her EGA is [redacted]w[redacted]d., however she was checking her cervical mucous and feels that she ovulated later and conceived 03/14/2017. Cycles are 5. days, regular, and occur approximately every : 30 days, however her LMP was the first menses after her missed abortion and D&C 01/07/2017.  Her last pap smear was 4 months ago and was NIL.Marland Kitchen    She had a urine pregnancy test which was positive 6 July one week ago  ago. Her last menstrual period was normal and lasted for  4 or 5 day(s). Since her LMP she claims she has experienced mild nausea and cramping. She denies vaginal bleeding. Her past medical history is remarkable for anxiety (2 years), depression (Sept 2017), elevated blood pressures while on BCPs and surrounding her last pregnancy, obesity, allergic rhinitis, menstrual migraine headaches, and lichen sclerosis. She currently takes Zoloft 50 mgm daily and Wellbutrin 150 XL daily and sees a Social worker in [employer] [facility] [employer].  Her prior pregnancies are notable for a SVD of a 7#5oz daughter.  Since her LMP, she admits to the use of tobacco products  No Since her LMP, she denies use of alcohol or illicit drugs. She claims she has gained   3 pounds since the start of her pregnancy.  There are cats in the home in the home  no If yes NA She admits close contact with children on a regular basis  yes  She has had chicken pox in the past yes She has had Tuberculosis exposures, symptoms, or previously tested positive for TB   no Current or past history of domestic violence. no  Genetic Screening/Teratology Counseling: (Includes patient, baby's father, or anyone in either family with:)   32. Patient's age  >/= 44 at [facility]  yes 2. Thalassemia ([place], [place], [place], or Asian background): MCV<80  no 3. Neural tube defect (meningomyelocele, spina bifida, anencephaly)  no 4. Congenital heart defect  no  5. Down syndrome  Yes, first cousin has Down's syndrome 6. Tay-Sachs (Jewish, [place])  no 7. Canavan's Disease  no 8. Sickle cell disease or trait (African)  no  9. Hemophilia or other blood disorders  no  10. Muscular dystrophy  no  11. Cystic fibrosis  no  12. Huntington's Chorea  no  13. Mental retardation/autism  no 14. Other inherited genetic or chromosomal disorder  no 15. Maternal metabolic disorder (DM, PKU, etc)  no 16. Patient or FOB with a child with a birth defect not listed above no  16a. Patient or FOB with a birth defect themselves no 17. Recurrent pregnancy loss, or stillbirth  no  18. Any medications since LMP other than prenatal vitamins (include vitamins, supplements, OTC meds, drugs, alcohol)  Yes, wellbutrin 150 mgm XL daily, Zyrtec 10 mgm daily, Benadryl prn, prenatal vitamins, Zoloft 50 mgm daily 19. Any other genetic/environmental exposure to discuss  no  Infection History:   1. Lives with someone with TB or TB exposed  no  2. Patient or partner has history of genital herpes  no 3. Rash or viral illness since LMP  no 4. History of STI (GC, CT, HPV, syphilis, HIV)  no 5. History of recent travel :  no  Other pertinent information:  Yes, husband Gerald Stabs is 54 yo    Review of Systems:10 point review of systems negative unless otherwise noted in HPI  Past Medical History:  Past Medical History:  Diagnosis Date  . Allergic rhinitis   . Anxiety   . Chronic headaches    HORMONAL   . Complication of anesthesia   . Depression   . Elevated blood pressure   . PONV (postoperative nausea and vomiting)     Past Surgical History:  Past Surgical History:  Procedure Laterality Date  . DILATION AND EVACUATION N/A 01/07/2017   Procedure: DILATATION AND  EVACUATION;  Surgeon: Malachy Mood, MD;  Location: [facility] ORS;  Service: Gynecology;  Laterality: N/A;  . TONSILLECTOMY AND ADENOIDECTOMY  1994    Gynecologic History: Patient's last menstrual period was 02/10/2017 (exact date).  Obstetric History:  OB History  Gravida Para Term Preterm AB Living  3 1 1   1 1   SAB TAB Ectopic Multiple Live Births  1       1    # Outcome Date GA Lbr Len/2nd Weight Sex Delivery Anes PTL Lv  3 Current           2 SAB 01/07/17 [redacted]w[redacted]d    SAB     1 Term 03/20/11 [redacted]w[redacted]d  7 lb 5 oz (3.317 kg) F Vag-Spont  N LIV      Family History:  Family History  Problem Relation Age of Onset  . Alcoholism Unknown   . Breast cancer Maternal Aunt 104       Mothers aunt  . Lung cancer Unknown   . Hypertension Father   . Heart disease Father   . Hypertension Brother   . Hypertension Brother        prediabetes  . Lymphoma Paternal Grandfather   . Schizophrenia Maternal Uncle     Social History:  Social History   Social History  . Marital status: Married    Spouse name: N/A  . Number of children: 1  . Years of education: N/A   Occupational History  . Not on file.   Social History Main Topics  . Smoking status: Former Smoker    Packs/day: 0.25    Years: 16.00    Types: Cigarettes    Quit date: 01/07/2015  . Smokeless tobacco: Never Used  . Alcohol use 0.0 oz/week     Comment: rare  . Drug use: No  . Sexual activity: Yes    Birth control/ protection: None   Other Topics Concern  . Not on file   Social History Narrative  . No narrative on file    Allergies:  No Known Allergies  Medications: Prior to Admission medications   Medication Sig Start Date End Date Taking? Authorizing Provider  buPROPion (WELLBUTRIN XL) 150 MG 24 hr tablet Take 1 tablet (150 mg total) by mouth daily. Patient taking differently: Take 150 mg by mouth every morning.  07/25/16   Leone Haven, MD  cetirizine (ZYRTEC) 10 MG tablet Take 10 mg by mouth every morning.      [provider]         diphenhydrAMINE (BENADRYL) 25 MG tablet Take 25 mg by mouth at bedtime as needed for sleep.    [provider]  Prenatal Vit-Fe Fumarate-FA (PRENATAL MULTIVITAMIN) TABS tablet Take 1 tablet by mouth daily.     [provider]  sertraline (ZOLOFT) 100 MG tablet Take 50 mg by mouth every morning.  [provider]  sodium chloride (OCEAN) 0.65 % SOLN nasal spray Place 1 spray into both nostrils as needed for congestion.    [provider]    Physical Exam Vitals: BP 110/70   Wt 238 lb (108 kg)   LMP 02/10/2017 (Exact Date)   BMI 38.41 kg/m   General: obese, pleasant WF in NAD HEENT: normocephalic, anicteric Thyroid: no enlargement, no palpable nodules Pulmonary: No increased work of breathing, CTAB Cardiovascular: RRR without murmur Abdomen: soft, non-tender, non-distended.  Umbilicus without lesions.  No hepatomegaly, splenomegaly or masses palpable. No evidence of hernia  Genitourinary:  External: Normal external female genitalia.  Normal urethral meatus, normal Bartholin's and Skene's glands.    Vagina: Normal vaginal mucosa, no evidence of prolapse.    Cervix: Grossly normal in appearance, no bleeding  Uterus: AV, Non-enlarged, mobile, normal contour, non tender  Adnexa: ovaries non-enlarged, no adnexal masses  Rectal: deferred Extremities: no edema, erythema, or tenderness Neurologic: Grossly intact Psychiatric: mood appropriate, affect full   Assessment: 36 y.o. G3P1011 with amenorrhea and positive pregnancy test. S/p missed AB in April. Size dates discrepancy Obesity Anxiety and depression  Plan: 1) Avoid alcoholic beverages. 2) Patient encouraged not to smoke.  3) Discontinue the use of all non-medicinal drugs and chemicals. Continue Zoloft and Wellbutrin. 4) Take prenatal vitamins daily. Folic acid 1 mgm daily prescribed 5) Nutrition, food safety (fish, cheese advisories, and high nitrite foods)  and exercise discussed. 6) Hospital and practice style discussed with cross coverage system.  7) Genetic Screening, such as with 1st Trimester Screening, cell free fetal DNA, AFP testing, and Ultrasound, as well as with amniocentesis discussed.  At the conclusion of today's visit patient requested genetic testingrecessive in the form of free cell DNA testing. Will get early one hour glucola at that time. NOB labs, UA, GC/Chlamydia NAAT, CMP, hemoglobin A1C, and UDS today. Offered carrier screening also. 8) Patient is asked about travel to areas at risk for the Zika virus, and counseled to avoid travel and exposure to mosquitoes or sexual partners who may have themselves been exposed to the virus. Testing is discussed, and will be ordered as appropriate.  9) TVUS today: gestational sac seen, but no yolk sac or fetal pole is seen. Will repeat viability scan in 2 weeks with ROB.  Dalia Heading, CNM    Pt had recent [place] in April 18, pt very nervous. No other problems.

## 2017-04-03 LAB — COMPREHENSIVE METABOLIC PANEL
A/G RATIO: 1.9 (ref 1.2–2.2)
ALK PHOS: 58 IU/L (ref 39–117)
ALT: 17 IU/L (ref 0–32)
AST: 22 IU/L (ref 0–40)
Albumin: 4.3 g/dL (ref 3.5–5.5)
BILIRUBIN TOTAL: 0.4 mg/dL (ref 0.0–1.2)
BUN/Creatinine Ratio: 14 (ref 9–23)
BUN: 9 mg/dL (ref 6–20)
CHLORIDE: 102 mmol/L (ref 96–106)
CO2: 23 mmol/L (ref 20–29)
Calcium: 9.2 mg/dL (ref 8.7–10.2)
Creatinine, Ser: 0.63 mg/dL (ref 0.57–1.00)
GFR calc Af Amer: 134 mL/min/{1.73_m2} (ref >59)
GFR calc non Af Amer: 117 mL/min/{1.73_m2} (ref >59)
Globulin, Total: 2.3 g/dL (ref 1.5–4.5)
Glucose: 79 mg/dL (ref 65–99)
POTASSIUM: 4.3 mmol/L (ref 3.5–5.2)
Sodium: 138 mmol/L (ref 134–144)
Total Protein: 6.6 g/dL (ref 6.0–8.5)

## 2017-04-03 LAB — RPR+RH+ABO+RUB AB+AB SCR+CB...
Antibody Screen: NEGATIVE
HEMOGLOBIN: 12.4 g/dL (ref 11.1–15.9)
HIV Screen 4th Generation wRfx: NONREACTIVE
Hematocrit: 37 % (ref 34.0–46.6)
Hepatitis B Surface Ag: NEGATIVE
MCH: 30.4 pg (ref 26.6–33.0)
MCHC: 33.5 g/dL (ref 31.5–35.7)
MCV: 91 fL (ref 79–97)
PLATELETS: 215 10*3/uL (ref 150–379)
RBC: 4.08 x10E6/uL (ref 3.77–5.28)
RDW: 13.3 % (ref 12.3–15.4)
RPR Ser Ql: NONREACTIVE
RUBELLA: 5.03 {index} (ref >0.99)
Rh Factor: POSITIVE
Varicella zoster IgG: 2192 index (ref >165)
WBC: 6 10*3/uL (ref 3.4–10.8)

## 2017-04-03 LAB — LIPID PANEL WITH LDL/HDL RATIO
CHOLESTEROL TOTAL: 154 mg/dL (ref 100–199)
HDL: 70 mg/dL (ref >39)
LDL Calculated: 72 mg/dL (ref 0–99)
LDl/HDL Ratio: 1 ratio (ref 0.0–3.2)
TRIGLYCERIDES: 58 mg/dL (ref 0–149)
VLDL Cholesterol Cal: 12 mg/dL (ref 5–40)

## 2017-04-03 LAB — HGB A1C W/O EAG: HEMOGLOBIN A1C: 5 % (ref 4.8–5.6)

## 2017-04-04 LAB — URINE DRUG PANEL 7
Amphetamines, Urine: NEGATIVE ng/mL
BARBITURATE QUANT UR: NEGATIVE ng/mL
BENZODIAZEPINE QUANT UR: NEGATIVE ng/mL
CANNABINOID QUANT UR: NEGATIVE ng/mL
Cocaine (Metab.): NEGATIVE ng/mL
OPIATE QUANT UR: NEGATIVE ng/mL
PCP Quant, Ur: NEGATIVE ng/mL

## 2017-04-04 LAB — URINE CULTURE: Organism ID, Bacteria: NO GROWTH

## 2017-04-04 LAB — CHLAMYDIA/GONOCOCCUS/TRICHOMONAS, NAA
Chlamydia by NAA: NEGATIVE
Gonococcus by NAA: NEGATIVE
TRICH VAG BY NAA: NEGATIVE

## 2017-04-06 ENCOUNTER — Encounter: Payer: Self-pay | Admitting: Certified Nurse Midwife

## 2017-04-06 DIAGNOSIS — O09529 Supervision of elderly multigravida, unspecified trimester: Secondary | ICD-10-CM | POA: Insufficient documentation

## 2017-04-06 NOTE — Progress Notes (Signed)
NOB H&P and NOB labs Risk factors: AMA, anxiety and depression, obesity (BMI 38-39) TVUS today: gestational sac only, no yolk sac or fetal pole (thinks she conceived later in cycle) Viability scan and ROB in 2 weeks

## 2017-04-15 ENCOUNTER — Ambulatory Visit (INDEPENDENT_AMBULATORY_CARE_PROVIDER_SITE_OTHER): Payer: 59 | Admitting: Family Medicine

## 2017-04-15 ENCOUNTER — Encounter: Payer: Self-pay | Admitting: Family Medicine

## 2017-04-15 DIAGNOSIS — Z3A09 9 weeks gestation of pregnancy: Secondary | ICD-10-CM | POA: Diagnosis not present

## 2017-04-15 DIAGNOSIS — Z3490 Encounter for supervision of normal pregnancy, unspecified, unspecified trimester: Secondary | ICD-10-CM | POA: Insufficient documentation

## 2017-04-15 DIAGNOSIS — F419 Anxiety disorder, unspecified: Secondary | ICD-10-CM | POA: Diagnosis not present

## 2017-04-15 DIAGNOSIS — F329 Major depressive disorder, single episode, unspecified: Secondary | ICD-10-CM | POA: Diagnosis not present

## 2017-04-15 NOTE — Patient Instructions (Signed)
Nice to see you. Please let us know if you need anything prior to your 6 month follow-up. Please continue to monitor your anxiety and depression.

## 2017-04-15 NOTE — Assessment & Plan Note (Signed)
Recently found out she is pregnant. Following with OB. She'll continue to follow with them regarding this. Has scheduled ultrasound and follow-up later this week.

## 2017-04-15 NOTE — Assessment & Plan Note (Signed)
Well-controlled at this point. She has discussed her medications with OB. They plan to continue Wellbutrin and Zoloft during her pregnancy at this point. Discussed if they decide to change this that she should let us know.

## 2017-04-15 NOTE — Progress Notes (Signed)
  Tommi Rumps, MD Phone: [phone]  Melinda Cortez is a 36 y.o. female who presents today for follow-up.  Anxiety/depression: She notes minimal anxiety. Most of the surrounding trying to get in to determine if she is pregnant. No depression. She is currently on Wellbutrin and Zoloft. No SI or HI. Still seeing the counselor which has been beneficial.  Recently found out she was pregnant. She is at 9 weeks and 1 day based on last menstrual period though her pregnancy may be not that far along given that she ovulated about 4 weeks after her menstrual cycle. She has already seen OB and has an ultrasound scheduled later this week and follow-up with them. They have planned at this point to continue Wellbutrin and Zoloft.  PMH: Former smoker   ROS see history of present illness  Objective  Physical Exam Vitals:   04/15/17 0806  BP: 120/88  Pulse: 68  Temp: 98.5 F (36.9 C)    BP Readings from Last 3 Encounters:  04/15/17 120/88  04/02/17 110/70  03/04/17 114/82   Wt Readings from Last 3 Encounters:  04/15/17 241 lb (109.3 kg)  04/02/17 238 lb (108 kg)  03/04/17 229 lb 12.8 oz (104.2 kg)    Physical Exam  Constitutional: No distress.  Cardiovascular: Normal rate, regular rhythm and normal heart sounds.   Pulmonary/Chest: Effort normal and breath sounds normal.  Neurological: She is alert. Gait normal.  Skin: She is not diaphoretic.     Assessment/Plan: Please see individual problem list.  Anxiety and depression Well-controlled at this point. She has discussed her medications with OB. They plan to continue Wellbutrin and Zoloft during her pregnancy at this point. Discussed if they decide to change this that she should let us know.  Pregnancy Recently found out she is pregnant. Following with OB. She'll continue to follow with them regarding this. Has scheduled ultrasound and follow-up later this week.  Tommi Rumps, MD [place]

## 2017-04-17 ENCOUNTER — Other Ambulatory Visit: Payer: Self-pay | Admitting: Advanced Practice Midwife

## 2017-04-17 ENCOUNTER — Ambulatory Visit: Payer: 59

## 2017-04-17 ENCOUNTER — Encounter: Payer: 59 | Admitting: Advanced Practice Midwife

## 2017-04-17 DIAGNOSIS — Z3689 Encounter for other specified antenatal screening: Secondary | ICD-10-CM

## 2017-04-18 ENCOUNTER — Ambulatory Visit (INDEPENDENT_AMBULATORY_CARE_PROVIDER_SITE_OTHER): Payer: 59 | Admitting: Obstetrics and Gynecology

## 2017-04-18 DIAGNOSIS — Z3689 Encounter for other specified antenatal screening: Secondary | ICD-10-CM

## 2017-04-18 MED ORDER — ONDANSETRON 4 MG PO TBDP: 4.0000 mg | ORAL_TABLET | Freq: Four times a day (QID) | ORAL | 0 refills | Status: DC | PRN

## 2017-04-18 NOTE — Progress Notes (Signed)
Dates changed based on [place] to [facility] 12/06/17, rx zofran discussed studies

## 2017-04-18 NOTE — Progress Notes (Signed)
N/V

## 2017-04-22 ENCOUNTER — Ambulatory Visit (INDEPENDENT_AMBULATORY_CARE_PROVIDER_SITE_OTHER): Payer: 59 | Admitting: Psychology

## 2017-04-22 DIAGNOSIS — F4323 Adjustment disorder with mixed anxiety and depressed mood: Secondary | ICD-10-CM | POA: Diagnosis not present

## 2017-04-25 ENCOUNTER — Encounter: Payer: Self-pay | Admitting: Certified Nurse Midwife

## 2017-04-30 ENCOUNTER — Encounter: Payer: Self-pay | Admitting: Obstetrics and Gynecology

## 2017-05-15 ENCOUNTER — Ambulatory Visit (INDEPENDENT_AMBULATORY_CARE_PROVIDER_SITE_OTHER): Payer: 59 | Admitting: Obstetrics & Gynecology

## 2017-05-15 VITALS — BP 128/90 | Wt 237.0 lb

## 2017-05-15 DIAGNOSIS — O09529 Supervision of elderly multigravida, unspecified trimester: Secondary | ICD-10-CM

## 2017-05-15 DIAGNOSIS — Z3A10 10 weeks gestation of pregnancy: Secondary | ICD-10-CM

## 2017-05-15 MED ORDER — ONDANSETRON 4 MG PO TBDP: 4.0000 mg | ORAL_TABLET | Freq: Four times a day (QID) | ORAL | 1 refills | Status: DC | PRN

## 2017-05-15 NOTE — Patient Instructions (Signed)
First Trimester of Pregnancy The first trimester of pregnancy is from week 1 until the end of week 13 (months 1 through 3). A week after a sperm fertilizes an egg, the egg will implant on the wall of the uterus. This embryo will begin to develop into a baby. Genes from you and your partner will form the baby. The female genes will determine whether the baby will be a boy or a girl. At 6-8 weeks, the eyes and face will be formed, and the heartbeat can be seen on ultrasound. At the end of 12 weeks, all the baby's organs will be formed. Now that you are pregnant, you will want to do everything you can to have a healthy baby. Two of the most important things are to get good prenatal care and to follow your health care provider's instructions. Prenatal care is all the medical care you receive before the baby's birth. This care will help prevent, find, and treat any problems during the pregnancy and childbirth. Body changes during your first trimester Your body goes through many changes during pregnancy. The changes vary from woman to woman.  You may gain or lose a couple of pounds at first.  You may feel sick to your stomach (nauseous) and you may throw up (vomit). If the vomiting is uncontrollable, call your health care provider.  You may tire easily.  You may develop headaches that can be relieved by medicines. All medicines should be approved by your health care provider.  You may urinate more often. Painful urination may mean you have a bladder infection.  You may develop heartburn as a result of your pregnancy.  You may develop constipation because certain hormones are causing the muscles that push stool through your intestines to slow down.  You may develop hemorrhoids or swollen veins (varicose veins).  Your breasts may begin to grow larger and become tender. Your nipples may stick out more, and the tissue that surrounds them (areola) may become darker.  Your gums may bleed and may be  sensitive to brushing and flossing.  Dark spots or blotches (chloasma, mask of pregnancy) may develop on your face. This will likely fade after the baby is born.  Your menstrual periods will stop.  You may have a loss of appetite.  You may develop cravings for certain kinds of food.  You may have changes in your emotions from day to day, such as being excited to be pregnant or being concerned that something may go wrong with the pregnancy and baby.  You may have more vivid and strange dreams.  You may have changes in your hair. These can include thickening of your hair, rapid growth, and changes in texture. Some women also have hair loss during or after pregnancy, or hair that feels dry or thin. Your hair will most likely return to normal after your baby is born.  What to expect at prenatal visits During a routine prenatal visit:  You will be weighed to make sure you and the baby are growing normally.  Your blood pressure will be taken.  Your abdomen will be measured to track your baby's growth.  The fetal heartbeat will be listened to between weeks 10 and 14 of your pregnancy.  Test results from any previous visits will be discussed.  Your health care provider may ask you:  How you are feeling.  If you are feeling the baby move.  If you have had any abnormal symptoms, such as leaking fluid, bleeding, severe headaches,   or abdominal cramping.  If you are using any tobacco products, including cigarettes, chewing tobacco, and electronic cigarettes.  If you have any questions.  Other tests that may be performed during your first trimester include:  Blood tests to find your blood type and to check for the presence of any previous infections. The tests will also be used to check for low iron levels (anemia) and protein on red blood cells (Rh antibodies). Depending on your risk factors, or if you previously had diabetes during pregnancy, you may have tests to check for high blood  sugar that affects pregnant women (gestational diabetes).  Urine tests to check for infections, diabetes, or protein in the urine.  An ultrasound to confirm the proper growth and development of the baby.  Fetal screens for spinal cord problems (spina bifida) and Down syndrome.  HIV (human immunodeficiency virus) testing. Routine prenatal testing includes screening for HIV, unless you choose not to have this test.  You may need other tests to make sure you and the baby are doing well.  Follow these instructions at home: Medicines  Follow your health care provider's instructions regarding medicine use. Specific medicines may be either safe or unsafe to take during pregnancy.  Take a prenatal vitamin that contains at least 600 micrograms (mcg) of folic acid.  If you develop constipation, try taking a stool softener if your health care provider approves. Eating and drinking  Eat a balanced diet that includes fresh fruits and vegetables, whole grains, good sources of protein such as meat, eggs, or tofu, and low-fat dairy. Your health care provider will help you determine the amount of weight gain that is right for you.  Avoid raw meat and uncooked cheese. These carry germs that can cause birth defects in the baby.  Eating four or five small meals rather than three large meals a day may help relieve nausea and vomiting. If you start to feel nauseous, eating a few soda crackers can be helpful. Drinking liquids between meals, instead of during meals, also seems to help ease nausea and vomiting.  Limit foods that are high in fat and processed sugars, such as fried and sweet foods.  To prevent constipation: ? Eat foods that are high in fiber, such as fresh fruits and vegetables, whole grains, and beans. ? Drink enough fluid to keep your urine clear or pale yellow. Activity  Exercise only as directed by your health care provider. Most women can continue their usual exercise routine during  pregnancy. Try to exercise for 30 minutes at least 5 days a week. Exercising will help you: ? Control your weight. ? Stay in shape. ? Be prepared for labor and delivery.  Experiencing pain or cramping in the lower abdomen or lower back is a good sign that you should stop exercising. Check with your health care provider before continuing with normal exercises.  Try to avoid standing for long periods of time. Move your legs often if you must stand in one place for a long time.  Avoid heavy lifting.  Wear low-heeled shoes and practice good posture.  You may continue to have sex unless your health care provider tells you not to. Relieving pain and discomfort  Wear a good support bra to relieve breast tenderness.  Take warm sitz baths to soothe any pain or discomfort caused by hemorrhoids. Use hemorrhoid cream if your health care provider approves.  Rest with your legs elevated if you have leg cramps or low back pain.  If you develop   varicose veins in your legs, wear support hose. Elevate your feet for 15 minutes, 3-4 times a day. Limit salt in your diet. Prenatal care  Schedule your prenatal visits by the twelfth week of pregnancy. They are usually scheduled monthly at first, then more often in the last 2 months before delivery.  Write down your questions. Take them to your prenatal visits.  Keep all your prenatal visits as told by your health care provider. This is important. Safety  Wear your seat belt at all times when driving.  Make a list of emergency phone numbers, including numbers for family, friends, the hospital, and police and fire departments. General instructions  Ask your health care provider for a referral to a local prenatal education class. Begin classes no later than the beginning of month 6 of your pregnancy.  Ask for help if you have counseling or nutritional needs during pregnancy. Your health care provider can offer advice or refer you to specialists for help  with various needs.  Do not use hot tubs, steam rooms, or saunas.  Do not douche or use tampons or scented sanitary pads.  Do not cross your legs for long periods of time.  Avoid cat litter boxes and soil used by cats. These carry germs that can cause birth defects in the baby and possibly loss of the fetus by miscarriage or stillbirth.  Avoid all smoking, herbs, alcohol, and medicines not prescribed by your health care provider. Chemicals in these products affect the formation and growth of the baby.  Do not use any products that contain nicotine or tobacco, such as cigarettes and e-cigarettes. If you need help quitting, ask your health care provider. You may receive counseling support and other resources to help you quit.  Schedule a dentist appointment. At home, brush your teeth with a soft toothbrush and be gentle when you floss. Contact a health care provider if:  You have dizziness.  You have mild pelvic cramps, pelvic pressure, or nagging pain in the abdominal area.  You have persistent nausea, vomiting, or diarrhea.  You have a bad smelling vaginal discharge.  You have pain when you urinate.  You notice increased swelling in your face, hands, legs, or ankles.  You are exposed to fifth disease or chickenpox.  You are exposed to German measles (rubella) and have never had it. Get help right away if:  You have a fever.  You are leaking fluid from your vagina.  You have spotting or bleeding from your vagina.  You have severe abdominal cramping or pain.  You have rapid weight gain or loss.  You vomit blood or material that looks like coffee grounds.  You develop a severe headache.  You have shortness of breath.  You have any kind of trauma, such as from a fall or a car accident. Summary  The first trimester of pregnancy is from week 1 until the end of week 13 (months 1 through 3).  Your body goes through many changes during pregnancy. The changes vary from  woman to woman.  You will have routine prenatal visits. During those visits, your health care provider will examine you, discuss any test results you may have, and talk with you about how you are feeling. This information is not intended to replace advice given to you by your health care provider. Make sure you discuss any questions you have with your health care provider. Document Released: 09/03/2001 Document Revised: 08/21/2016 Document Reviewed: 08/21/2016 Elsevier Interactive Patient Education  2017 [employer]  

## 2017-05-15 NOTE — Progress Notes (Signed)
Prenatal Visit Note Date: 05/15/2017 Clinic: [facility]  Subjective:  Melinda Cortez is a 36 y.o. G3P1011 at [redacted]w[redacted]d being seen today for ongoing prenatal care.  She is currently monitored for the following issues for this low-risk pregnancy and has Anxiety and depression; Allergic rhinitis; Missed abortion; Elevated BP without diagnosis of hypertension; Migraine; Encounter for supervision of high risk multigravida of advanced maternal age, antepartum; and Pregnancy on her problem list.  Patient reports no complaints and breast T and nausea still requiring some Zofran therapy.   Contractions: Not present. Vag. Bleeding: None.   . Denies leaking of fluid.   The following portions of the patient's history were reviewed and updated as appropriate: allergies, current medications, past family history, past medical history, past social history, past surgical history and problem list. Problem list updated.  Objective:   Vitals:   05/15/17 0940  BP: 128/90  Weight: 237 lb (107.5 kg)    Fetal Status:           General:  Alert, oriented and cooperative. Patient is in no acute distress.  Skin: Skin is warm and dry. No rash noted.   Cardiovascular: Normal heart rate noted  Respiratory: Normal respiratory effort, no problems with respiration noted  Abdomen: Soft, gravid, appropriate for gestational age. Pain/Pressure: Absent     Pelvic:  Cervical exam deferred        Extremities: Normal range of motion.     Mental Status: Normal mood and affect. Normal behavior. Normal judgment and thought content.   Urinalysis: Urine Protein: Negative Urine Glucose: Negative  Assessment and Plan:  Pregnancy: G3P1011 at [redacted]w[redacted]d  1. [redacted] weeks gestation of pregnancy - MaterniT21 PLUS Core+SCA  2. Encounter for supervision of high risk multigravida of advanced maternal age, antepartum  General obstetric precautions including but not limited to vaginal bleeding, contractions, leaking of fluid and fetal movement  were reviewed in detail with the patient. Please refer to After Visit Summary for other counseling recommendations.  Return in about 4 weeks (around 06/12/2017) for ROB.  US done today for FHTS- 150s.  CRL appropriate w EDC 12/03/16, keep [place] of 12/06/16.  FM +.  Barnett Applebaum, MD, [facility], [facility] 05/15/2017  10:19 AM

## 2017-05-20 ENCOUNTER — Ambulatory Visit (INDEPENDENT_AMBULATORY_CARE_PROVIDER_SITE_OTHER): Payer: 59 | Admitting: Maternal Newborn

## 2017-05-20 ENCOUNTER — Ambulatory Visit (INDEPENDENT_AMBULATORY_CARE_PROVIDER_SITE_OTHER): Payer: 59

## 2017-05-20 ENCOUNTER — Other Ambulatory Visit: Payer: Self-pay | Admitting: Maternal Newborn

## 2017-05-20 ENCOUNTER — Ambulatory Visit (INDEPENDENT_AMBULATORY_CARE_PROVIDER_SITE_OTHER): Payer: 59 | Admitting: Psychology

## 2017-05-20 VITALS — BP 132/80 | Wt 237.0 lb

## 2017-05-20 DIAGNOSIS — Z0189 Encounter for other specified special examinations: Secondary | ICD-10-CM

## 2017-05-20 DIAGNOSIS — Z362 Encounter for other antenatal screening follow-up: Secondary | ICD-10-CM

## 2017-05-20 DIAGNOSIS — Z3A11 11 weeks gestation of pregnancy: Secondary | ICD-10-CM

## 2017-05-20 DIAGNOSIS — O09529 Supervision of elderly multigravida, unspecified trimester: Secondary | ICD-10-CM

## 2017-05-20 DIAGNOSIS — F4323 Adjustment disorder with mixed anxiety and depressed mood: Secondary | ICD-10-CM | POA: Diagnosis not present

## 2017-05-20 LAB — MATERNIT21 PLUS CORE+SCA
Chromosome 13: NEGATIVE
Chromosome 18: NEGATIVE
Chromosome 21: NEGATIVE
Y CHROMOSOME: DETECTED
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
## 2017-05-29 ENCOUNTER — Ambulatory Visit (INDEPENDENT_AMBULATORY_CARE_PROVIDER_SITE_OTHER): Payer: 59 | Admitting: Obstetrics and Gynecology

## 2017-05-29 VITALS — BP 132/84 | Wt 240.0 lb

## 2017-05-29 DIAGNOSIS — O09529 Supervision of elderly multigravida, unspecified trimester: Secondary | ICD-10-CM

## 2017-05-29 DIAGNOSIS — R030 Elevated blood-pressure reading, without diagnosis of hypertension: Secondary | ICD-10-CM

## 2017-05-29 DIAGNOSIS — F329 Major depressive disorder, single episode, unspecified: Secondary | ICD-10-CM

## 2017-05-29 DIAGNOSIS — F419 Anxiety disorder, unspecified: Secondary | ICD-10-CM

## 2017-05-29 DIAGNOSIS — O219 Vomiting of pregnancy, unspecified: Secondary | ICD-10-CM

## 2017-05-29 DIAGNOSIS — Z3A12 12 weeks gestation of pregnancy: Secondary | ICD-10-CM

## 2017-05-29 MED ORDER — VITAMIN B-6 25 MG PO TABS: 25.0000 mg | ORAL_TABLET | Freq: Three times a day (TID) | ORAL | 2 refills | Status: DC

## 2017-05-29 NOTE — Progress Notes (Signed)
Routine Prenatal Care Visit  Subjective  Melinda Cortez is a 36 y.o. G3P1011 at [redacted]w[redacted]d being seen today for ongoing prenatal care.  She is currently monitored for the following issues for this high-risk pregnancy and has Anxiety and depression; Allergic rhinitis; Missed abortion; Elevated BP without diagnosis of hypertension; Migraine; Encounter for supervision of high risk multigravida of advanced maternal age, antepartum; and Pregnancy on her problem list.  ----------------------------------------------------------------------------------- Patient reports heartburn and nausea.   Contractions: Not present. Vag. Bleeding: None.   . Denies leaking of fluid.  Nausea, zofran not working ----------------------------------------------------------------------------------- The following portions of the patient's history were reviewed and updated as appropriate: allergies, current medications, past family history, past medical history, past social history, past surgical history and problem list. Problem list updated.  Objective  Blood pressure 132/84, weight 240 lb (108.9 kg), last menstrual period 02/10/2017, unknown if currently breastfeeding. Pregravid weight 238 lb (108 kg) Total Weight Gain 2 lb (0.907 kg) Urinalysis: Urine Protein: Negative Urine Glucose: Negative  Fetal Status: Fetal Heart Rate (bpm): 165         General:  Alert, oriented and cooperative. Patient is in no acute distress.  Skin: Skin is warm and dry. No rash noted.   Cardiovascular: Normal heart rate noted  Respiratory: Normal respiratory effort, no problems with respiration noted  Abdomen: Soft, gravid, appropriate for gestational age. Pain/Pressure: Absent     Pelvic:  Cervical exam deferred        Extremities: Normal range of motion.     Mental Status: Normal mood and affect. Normal behavior. Normal judgment and thought content.   Assessment   36 y.o. G3P1011 at [redacted]w[redacted]d by  12/06/2017, by Ultrasound presenting for  routine prenatal visit  Plan   pregnancy #3 Problems (from 04/02/17 to present)    Problem Noted Resolved   Pregnancy 04/15/2017 by Leone Haven, MD No   Encounter for supervision of high risk multigravida of advanced maternal age, antepartum 04/06/2017 by Dalia Heading, CNM No   Overview Addendum 04/11/2017  2:09 PM by Dalia Heading, [facility]  Prenatal Labs  Dating  Blood type: O/Positive/-- (07/11 0949)   Genetic Screen 1 Screen:    AFP:     Quad:     NIPS: Antibody:Negative (07/11 0949)  Anatomic [place]  Rubella: 5.03 (07/11 0949)/ Varicella immune  GTT Early:               Third trimester:  RPR: Non Reactive (07/11 0949)   Flu vaccine  HBsAg: Negative (07/11 0949)   TDaP vaccine                                               Rhogam: HIV: Non Reactive (07/11 0949)   Baby Food                                               GBS: (For PCN allergy, check sensitivities)  Contraception  Pap:NIL/neg 11/27/2016  Circumcision  GC/Chlamydia neg/neg  Pediatrician    Support Person         Elevated BP without diagnosis of hypertension 01/14/2017 by Leone Haven, MD No   Anxiety and depression 01/30/2016 by Leone Haven, MD No  Plan to rx vit b6 to take 25mg  po tid with meals. Email, if not working  Please refer to After Visit Summary for other counseling recommendations.   Return in about 2 weeks (around 06/12/2017) for Routine Prenatal Appointment, keep previously scheduled.  Prentice Docker, MD  05/29/2017 11:48 AM

## 2017-06-01 ENCOUNTER — Other Ambulatory Visit: Payer: Self-pay | Admitting: Family Medicine

## 2017-06-02 ENCOUNTER — Encounter: Payer: Self-pay | Admitting: Obstetrics and Gynecology

## 2017-06-03 ENCOUNTER — Other Ambulatory Visit: Payer: Self-pay | Admitting: Obstetrics and Gynecology

## 2017-06-03 DIAGNOSIS — O219 Vomiting of pregnancy, unspecified: Secondary | ICD-10-CM

## 2017-06-03 MED ORDER — METOCLOPRAMIDE HCL 10 MG PO TABS: 10.0000 mg | ORAL_TABLET | Freq: Three times a day (TID) | ORAL | 1 refills | Status: DC | PRN

## 2017-06-12 ENCOUNTER — Ambulatory Visit (INDEPENDENT_AMBULATORY_CARE_PROVIDER_SITE_OTHER): Payer: 59 | Admitting: Obstetrics & Gynecology

## 2017-06-12 VITALS — BP 120/80 | Wt 240.0 lb

## 2017-06-12 DIAGNOSIS — O09529 Supervision of elderly multigravida, unspecified trimester: Secondary | ICD-10-CM

## 2017-06-12 DIAGNOSIS — Z3A14 14 weeks gestation of pregnancy: Secondary | ICD-10-CM

## 2017-06-12 NOTE — Patient Instructions (Signed)

## 2017-06-12 NOTE — Progress Notes (Signed)
PNV, [place] nv, some nausea and she is still taking Reglan and B6

## 2017-06-17 ENCOUNTER — Ambulatory Visit (INDEPENDENT_AMBULATORY_CARE_PROVIDER_SITE_OTHER): Payer: 59 | Admitting: Psychology

## 2017-06-17 DIAGNOSIS — F4323 Adjustment disorder with mixed anxiety and depressed mood: Secondary | ICD-10-CM

## 2017-06-20 ENCOUNTER — Ambulatory Visit: Payer: 59 | Admitting: Psychology

## 2017-07-15 ENCOUNTER — Ambulatory Visit (INDEPENDENT_AMBULATORY_CARE_PROVIDER_SITE_OTHER): Payer: 59

## 2017-07-15 ENCOUNTER — Ambulatory Visit (INDEPENDENT_AMBULATORY_CARE_PROVIDER_SITE_OTHER): Payer: 59 | Admitting: Obstetrics and Gynecology

## 2017-07-15 ENCOUNTER — Other Ambulatory Visit: Payer: 59

## 2017-07-15 VITALS — BP 124/82 | Wt 242.0 lb

## 2017-07-15 DIAGNOSIS — Z3A19 19 weeks gestation of pregnancy: Secondary | ICD-10-CM

## 2017-07-15 DIAGNOSIS — O09529 Supervision of elderly multigravida, unspecified trimester: Secondary | ICD-10-CM

## 2017-07-15 DIAGNOSIS — Z3A14 14 weeks gestation of pregnancy: Secondary | ICD-10-CM | POA: Diagnosis not present

## 2017-07-15 DIAGNOSIS — Z362 Encounter for other antenatal screening follow-up: Secondary | ICD-10-CM

## 2017-07-15 DIAGNOSIS — Z23 Encounter for immunization: Secondary | ICD-10-CM

## 2017-07-15 DIAGNOSIS — R030 Elevated blood-pressure reading, without diagnosis of hypertension: Secondary | ICD-10-CM

## 2017-07-15 NOTE — Progress Notes (Signed)
ROB Nausea/vomiting Fatigued FLU vaccine given

## 2017-07-15 NOTE — Progress Notes (Signed)
Routine Prenatal Care Visit  Subjective  Melinda Cortez is a 36 y.o. G3P1011 at [redacted]w[redacted]d being seen today for ongoing prenatal care.  She is currently monitored for the following issues for this high-risk pregnancy and has Anxiety and depression; Allergic rhinitis; Missed abortion; Elevated BP without diagnosis of hypertension; Migraine; Encounter for supervision of high risk multigravida of advanced maternal age, antepartum; Pregnancy; and Nausea and vomiting during pregnancy on her problem list.  ----------------------------------------------------------------------------------- Patient reports nausea.   Contractions: Not present. Vag. Bleeding: None.  Movement: Present. Denies leaking of fluid.  ----------------------------------------------------------------------------------- The following portions of the patient's history were reviewed and updated as appropriate: allergies, current medications, past family history, past medical history, past social history, past surgical history and problem list. Problem list updated.   Objective  Blood pressure 124/82, weight 242 lb (109.8 kg), last menstrual period 02/10/2017, unknown if currently breastfeeding. Pregravid weight 238 lb (108 kg) Total Weight Gain 4 lb (1.814 kg) Urinalysis:      Fetal Status: Fetal Heart Rate (bpm): 145   Movement: Present     General:  Alert, oriented and cooperative. Patient is in no acute distress.  Skin: Skin is warm and dry. No rash noted.   Cardiovascular: Normal heart rate noted  Respiratory: Normal respiratory effort, no problems with respiration noted  Abdomen: Soft, gravid, appropriate for gestational age. Pain/Pressure: Absent     Pelvic:  Cervical exam deferred        Extremities: Normal range of motion.     ental Status: Normal mood and affect. Normal behavior. Normal judgment and thought content.     Assessment   36 y.o. G3P1011 at [redacted]w[redacted]d by  12/06/2017, by Ultrasound presenting for routine  prenatal visit  Plan   pregnancy #3 Problems (from 04/02/17 to present)    Problem Noted Resolved   Nausea and vomiting during pregnancy 05/29/2017 by Will Bonnet, MD No   Overview Signed 05/29/2017  5:49 PM by Will Bonnet, MD    - failed Diclegis - taking zofran, but is now @ 12 weeks, not working as well - add vit b6 for now. Other options discussed (phenergan, reglan)      Pregnancy 04/15/2017 by Leone Haven, MD No   Encounter for supervision of high risk multigravida of advanced maternal age, antepartum 04/06/2017 by Dalia Heading, CNM No   Overview Addendum 07/15/2017  9:40 AM by Malachy Mood, MD     Clinic [facility] Prenatal Labs  Dating 6 week [place] Blood type: O/Positive/-- (07/11 0949)   Genetic Screen NIPS: normal XY Antibody:Negative (07/11 0949)  Anatomic [place]  Rubella: 5.03 (07/11 0949)/ Varicella immune  GTT Third trimester:  RPR: Non Reactive (07/11 0949)   Flu vaccine 07/15/17 HBsAg: Negative (07/11 0949)   TDaP vaccine                                               Rhogam: N/A HIV: Non Reactive (07/11 0949)   Baby Food                                               GBS: (For PCN allergy, check sensitivities)  Contraception  Pap:NIL/neg 11/27/2016  Circumcision  GC/Chlamydia neg/neg  Pediatrician  Support Person             Elevated BP without diagnosis of hypertension 01/14/2017 by Leone Haven, MD No   Anxiety and depression 01/30/2016 by Leone Haven, MD No       Preterm labor symptoms and general obstetric precautions including but not limited to vaginal bleeding, contractions, leaking of fluid and fetal movement were reviewed in detail with the patient. Please refer to After Visit Summary for other counseling recommendations.   - reschedule anatomy scan ultrasonographer called out sick today - can either follow up with provider or receive call - discussed adding zofran prn to current regimen of reglan, 2lbs weight gain in last 4  weeks appropriate - influenza vaccination today  Return in about 4 weeks (around 08/12/2017) for Anatomy scan rescheduled ASAP may be overbooked for follow up afterwards and then ROB in 4 weeks.

## 2017-07-22 ENCOUNTER — Ambulatory Visit (INDEPENDENT_AMBULATORY_CARE_PROVIDER_SITE_OTHER): Payer: 59 | Admitting: Psychology

## 2017-07-22 DIAGNOSIS — F4323 Adjustment disorder with mixed anxiety and depressed mood: Secondary | ICD-10-CM

## 2017-07-26 ENCOUNTER — Other Ambulatory Visit: Payer: Self-pay | Admitting: Certified Nurse Midwife

## 2017-07-26 DIAGNOSIS — O09529 Supervision of elderly multigravida, unspecified trimester: Secondary | ICD-10-CM

## 2017-07-26 DIAGNOSIS — O99210 Obesity complicating pregnancy, unspecified trimester: Secondary | ICD-10-CM

## 2017-08-10 ENCOUNTER — Encounter: Payer: Self-pay | Admitting: Family Medicine

## 2017-08-11 MED ORDER — BUPROPION HCL ER (XL) 150 MG PO TB24: 150.0000 mg | ORAL_TABLET | Freq: Every day | ORAL | 1 refills | Status: DC

## 2017-08-12 ENCOUNTER — Encounter: Payer: Self-pay | Admitting: Obstetrics and Gynecology

## 2017-08-12 ENCOUNTER — Ambulatory Visit (INDEPENDENT_AMBULATORY_CARE_PROVIDER_SITE_OTHER): Payer: 59 | Admitting: Obstetrics and Gynecology

## 2017-08-12 VITALS — BP 122/80 | Wt 244.0 lb

## 2017-08-12 DIAGNOSIS — Z3A23 23 weeks gestation of pregnancy: Secondary | ICD-10-CM

## 2017-08-12 DIAGNOSIS — O09529 Supervision of elderly multigravida, unspecified trimester: Secondary | ICD-10-CM

## 2017-08-12 NOTE — Progress Notes (Signed)
ROB Carpel tunnel

## 2017-08-12 NOTE — Progress Notes (Signed)
Routine Prenatal Care Visit  Subjective  Melinda Cortez is a 36 y.o. G3P1011 at [redacted]w[redacted]d being seen today for ongoing prenatal care.  She is currently monitored for the following issues for this high-risk pregnancy and has Anxiety and depression; Allergic rhinitis; Elevated BP without diagnosis of hypertension; Migraine; and Encounter for supervision of high risk multigravida of advanced maternal age, antepartum on their problem list.  ----------------------------------------------------------------------------------- Patient reports carpal tunnel symptoms.   Contractions: Not present. Vag. Bleeding: None.  Movement: Present. Denies leaking of fluid.  ----------------------------------------------------------------------------------- The following portions of the patient's history were reviewed and updated as appropriate: allergies, current medications, past family history, past medical history, past social history, past surgical history and problem list. Problem list updated.   Objective  Blood pressure 122/80, weight 244 lb (110.7 kg), last menstrual period 02/10/2017, unknown if currently breastfeeding. Pregravid weight 238 lb (108 kg) Total Weight Gain 6 lb (2.722 kg)  Body mass index is 39.38 kg/m.  Urinalysis: Urine Protein: Negative Urine Glucose: Negative  Fetal Status: Fetal Heart Rate (bpm): 150   Movement: Present     General:  Alert, oriented and cooperative. Patient is in no acute distress.  Skin: Skin is warm and dry. No rash noted.   Cardiovascular: Normal heart rate noted  Respiratory: Normal respiratory effort, no problems with respiration noted  Abdomen: Soft, gravid, appropriate for gestational age. Pain/Pressure: Absent     Pelvic:  Cervical exam deferred        Extremities: Normal range of motion.     ental Status: Normal mood and affect. Normal behavior. Normal judgment and thought content.     Assessment   36 y.o. G3P1011 at [redacted]w[redacted]d by  12/06/2017, by  Ultrasound presenting for routine prenatal visit  Plan   pregnancy #3 Problems (from 04/02/17 to present)    Problem Noted Resolved   Nausea and vomiting during pregnancy 05/29/2017 by Will Bonnet, MD No   Overview Signed 05/29/2017  5:49 PM by Will Bonnet, MD    - failed Diclegis - taking zofran, but is now @ 12 weeks, not working as well - add vit b6 for now. Other options discussed (phenergan, reglan)      Pregnancy 04/15/2017 by Leone Haven, MD No   Encounter for supervision of high risk multigravida of advanced maternal age, antepartum 04/06/2017 by Dalia Heading, CNM No   Overview Addendum 07/15/2017  5:12 PM by Malachy Mood, MD     Clinic [facility] Prenatal Labs  Dating 6 week [place] Blood type: O/Positive/-- (07/11 0949)   Genetic Screen NIPS: normal XY Antibody:Negative (07/11 0949)  Anatomic [place] Complete 07/15/2017 Rubella: 5.03 (07/11 0949)/ Varicella immune  GTT Third trimester:  RPR: Non Reactive (07/11 0949)   Flu vaccine 07/15/17 HBsAg: Negative (07/11 0949)   TDaP vaccine                                               Rhogam: N/A HIV: Non Reactive (07/11 0949)   Baby Food                                               GBS: (For PCN allergy, check sensitivities)  Contraception  Pap:NIL/neg 11/27/2016  Circumcision  GC/Chlamydia neg/neg  Pediatrician    Support Person             Elevated BP without diagnosis of hypertension 01/14/2017 by Leone Haven, MD No   Anxiety and depression 01/30/2016 by Leone Haven, MD No       Preterm labor symptoms and general obstetric precautions including but not limited to vaginal bleeding, contractions, leaking of fluid and fetal movement were reviewed in detail with the patient. Please refer to After Visit Summary for other counseling recommendations.  - is wearing wrist supports at night, discussed ortho referral for consideration of cortisone injection if worsening of symptoms - 28 week labs next  visit Return in about 4 weeks (around 09/09/2017) for ROB and 28 week labs.

## 2017-08-16 ENCOUNTER — Encounter: Payer: Self-pay | Admitting: Obstetrics and Gynecology

## 2017-08-18 ENCOUNTER — Other Ambulatory Visit: Payer: Self-pay | Admitting: Obstetrics and Gynecology

## 2017-08-18 DIAGNOSIS — O26899 Other specified pregnancy related conditions, unspecified trimester: Principal | ICD-10-CM

## 2017-08-18 DIAGNOSIS — G5600 Carpal tunnel syndrome, unspecified upper limb: Secondary | ICD-10-CM

## 2017-08-18 NOTE — Progress Notes (Signed)
Patient with worsening bilateral carpal tunnel despite wearing wrist splints at night.  Interested in pursuing possible steroid injection.  We previously discussed steroids are safe in pregnancy, used routinely in the second or third trimester to promote fetal lung maturity.  Older studies showing a link of possible clef lip and palate for first trimester exposures have been discredited.

## 2017-08-19 ENCOUNTER — Ambulatory Visit: Payer: 59 | Admitting: Psychology

## 2017-09-09 ENCOUNTER — Ambulatory Visit (INDEPENDENT_AMBULATORY_CARE_PROVIDER_SITE_OTHER): Payer: 59 | Admitting: Obstetrics and Gynecology

## 2017-09-09 ENCOUNTER — Other Ambulatory Visit: Payer: 59

## 2017-09-09 ENCOUNTER — Encounter: Payer: Self-pay | Admitting: Obstetrics and Gynecology

## 2017-09-09 VITALS — BP 124/78 | Wt 250.0 lb

## 2017-09-09 DIAGNOSIS — E669 Obesity, unspecified: Secondary | ICD-10-CM | POA: Insufficient documentation

## 2017-09-09 DIAGNOSIS — O99213 Obesity complicating pregnancy, third trimester: Secondary | ICD-10-CM

## 2017-09-09 DIAGNOSIS — F32A Depression, unspecified: Secondary | ICD-10-CM

## 2017-09-09 DIAGNOSIS — O09529 Supervision of elderly multigravida, unspecified trimester: Secondary | ICD-10-CM

## 2017-09-09 DIAGNOSIS — Z3A27 27 weeks gestation of pregnancy: Secondary | ICD-10-CM

## 2017-09-09 DIAGNOSIS — Z3A23 23 weeks gestation of pregnancy: Secondary | ICD-10-CM

## 2017-09-09 DIAGNOSIS — F419 Anxiety disorder, unspecified: Secondary | ICD-10-CM

## 2017-09-09 DIAGNOSIS — O99210 Obesity complicating pregnancy, unspecified trimester: Secondary | ICD-10-CM | POA: Insufficient documentation

## 2017-09-09 DIAGNOSIS — R030 Elevated blood-pressure reading, without diagnosis of hypertension: Secondary | ICD-10-CM

## 2017-09-09 DIAGNOSIS — E66811 Obesity, class 1: Secondary | ICD-10-CM | POA: Insufficient documentation

## 2017-09-09 DIAGNOSIS — F329 Major depressive disorder, single episode, unspecified: Secondary | ICD-10-CM

## 2017-09-09 DIAGNOSIS — Z6838 Body mass index (BMI) 38.0-38.9, adult: Secondary | ICD-10-CM

## 2017-09-09 NOTE — Progress Notes (Signed)
Routine Prenatal Care Visit Subjective  Melinda Cortez is a 36 y.o. G3P1011 at [redacted]w[redacted]d being seen today for ongoing prenatal care.  She is currently monitored for the following issues for this high-risk pregnancy and has Anxiety and depression; Allergic rhinitis; Elevated BP without diagnosis of hypertension; Migraine; Encounter for supervision of high risk multigravida of advanced maternal age, antepartum; Obesity affecting pregnancy; and BMI 38.0-38.9,adult on their problem list.  ----------------------------------------------------------------------------------- Patient reports no complaints.   Contractions: Not present. Vag. Bleeding: None.  Movement: Present. Denies leaking of fluid.  28 week labs today ----------------------------------------------------------------------------------- The following portions of the patient's history were reviewed and updated as appropriate: allergies, current medications, past family history, past medical history, past social history, past surgical history and problem list. Problem list updated.   Objective  Blood pressure 124/78, weight 250 lb (113.4 kg), last menstrual period 02/10/2017, unknown if currently breastfeeding. Pregravid weight 238 lb (108 kg) Total Weight Gain 12 lb (5.443 kg) Urinalysis: Urine Protein: Negative Urine Glucose: Negative  Fetal Status: Fetal Heart Rate (bpm): 145 Fundal Height: 28 cm Movement: Present     General:  Alert, oriented and cooperative. Patient is in no acute distress.  Skin: Skin is warm and dry. No rash noted.   Cardiovascular: Normal heart rate noted  Respiratory: Normal respiratory effort, no problems with respiration noted  Abdomen: Soft, gravid, appropriate for gestational age. Pain/Pressure: Absent     Pelvic:  Cervical exam deferred        Extremities: Normal range of motion.     Mental Status: Normal mood and affect. Normal behavior. Normal judgment and thought content.   Assessment   36 y.o.  G3P1011 at [redacted]w[redacted]d by  12/06/2017, by Ultrasound presenting for routine prenatal visit  Plan   pregnancy #3 Problems (from 04/02/17 to present)    Problem Noted Resolved   Obesity affecting pregnancy 09/09/2017 by Will Bonnet, MD No   BMI 38.0-38.9,adult 09/09/2017 by Will Bonnet, MD No   Encounter for supervision of high risk multigravida of advanced maternal age, antepartum 04/06/2017 by Dalia Heading, [place] No   Overview Addendum 07/15/2017  5:12 PM by Malachy Mood, MD     Clinic [facility] Prenatal Labs  Dating 6 week [place] Blood type: O/Positive/-- (07/11 0949)   Genetic Screen NIPS: normal XY Antibody:Negative (07/11 0949)  Anatomic [place] Complete 07/15/2017 Rubella: 5.03 (07/11 0949)/ Varicella immune  GTT Third trimester:  RPR: Non Reactive (07/11 0949)   Flu vaccine 07/15/17 HBsAg: Negative (07/11 0949)   TDaP vaccine                                               Rhogam: N/A HIV: Non Reactive (07/11 0949)   Baby Food                                               GBS: (For PCN allergy, check sensitivities)  Contraception  Pap:NIL/neg 11/27/2016  Circumcision  GC/Chlamydia neg/neg  Pediatrician    Support Person         Elevated BP without diagnosis of hypertension 01/14/2017 by Leone Haven, MD No   Anxiety and depression 01/30/2016 by Leone Haven, MD No   Nausea and vomiting during pregnancy 05/29/2017  by Will Bonnet, MD 08/12/2017 by Malachy Mood, MD   Overview Signed 05/29/2017  5:49 PM by Will Bonnet, MD    - failed Diclegis - taking zofran, but is now @ 12 weeks, not working as well - add vit b6 for now. Other options discussed (phenergan, reglan)      Pregnancy 04/15/2017 by Leone Haven, MD 08/12/2017 by Malachy Mood, MD      Preterm labor symptoms and general obstetric precautions including but not limited to vaginal bleeding, contractions, leaking of fluid and fetal movement were reviewed in detail with the patient. Please  refer to After Visit Summary for other counseling recommendations.   Return in about 2 weeks (around 09/23/2017) for Routine Prenatal Appointment.  Prentice Docker, MD  09/09/2017 9:18 AM

## 2017-09-10 ENCOUNTER — Encounter: Payer: Self-pay | Admitting: Obstetrics and Gynecology

## 2017-09-10 LAB — 28 WEEK RH+PANEL
BASOS: 0 %
Basophils Absolute: 0 10*3/uL (ref 0.0–0.2)
EOS (ABSOLUTE): 0.1 10*3/uL (ref 0.0–0.4)
EOS: 1 %
Gestational Diabetes Screen: 131 mg/dL (ref 65–139)
HEMATOCRIT: 35.4 % (ref 34.0–46.6)
HEMOGLOBIN: 11.7 g/dL (ref 11.1–15.9)
HIV Screen 4th Generation wRfx: NONREACTIVE
IMMATURE GRANS (ABS): 0 10*3/uL (ref 0.0–0.1)
Immature Granulocytes: 0 %
LYMPHS ABS: 1.3 10*3/uL (ref 0.7–3.1)
LYMPHS: 21 %
MCH: 30.5 pg (ref 26.6–33.0)
MCHC: 33.1 g/dL (ref 31.5–35.7)
MCV: 92 fL (ref 79–97)
Monocytes Absolute: 0.4 10*3/uL (ref 0.1–0.9)
Monocytes: 6 %
NEUTROS ABS: 4.3 10*3/uL (ref 1.4–7.0)
Neutrophils: 72 %
Platelets: 184 10*3/uL (ref 150–379)
RBC: 3.83 x10E6/uL (ref 3.77–5.28)
RDW: 13.8 % (ref 12.3–15.4)
RPR Ser Ql: NONREACTIVE
WBC: 6.1 10*3/uL (ref 3.4–10.8)

## 2017-09-23 NOTE — L&D Delivery Note (Signed)
Delivery Note At 3:23 PM a viable female was delivered via Vaginal, Spontaneous (Presentation: LOA).  APGAR: 8, 9;  weight  pending.   Placenta status: delivered, spontaneously intact.  Cord: 3VC with the following complications: none.  Cord pH: n/a  Anesthesia:  epidural Episiotomy: None Lacerations: 2nd degree Suture Repair: 3.0 vicryl Est. Blood Loss (mL): 450  Mom to postpartum.  Baby to Couplet care / Skin to Skin.  Called to see patient.  Mom pushed to deliver a viable female infant.  The head followed by shoulders, which delivered without difficulty, and the rest of the body.  A single, loose nuchal cord noted and reduced.  Baby to mom's chest.  Cord clamped and cut after > 1 min delay.  Cord blood obtained.  Placenta delivered spontaneously, intact, with a 3-vessel cord.  Given possible accessory lobe, manual uterine sweep undertaken with no obvious retained products of conception or placental fragments.  Placenta inspected, apparent accessory lobe noted. Second degree perineal laceration repaired with 3-0 Vicryl in standard fashion.  All counts correct.  Hemostasis obtained with IV pitocin and fundal massage. EBL 450 mL.     Prentice Docker, MD 11/16/2017, 3:54 PM

## 2017-09-25 ENCOUNTER — Encounter: Payer: Self-pay | Admitting: Obstetrics and Gynecology

## 2017-09-25 ENCOUNTER — Ambulatory Visit (INDEPENDENT_AMBULATORY_CARE_PROVIDER_SITE_OTHER): Payer: Managed Care, Other (non HMO) | Admitting: Obstetrics and Gynecology

## 2017-09-25 VITALS — BP 122/70 | Wt 251.0 lb

## 2017-09-25 DIAGNOSIS — O09529 Supervision of elderly multigravida, unspecified trimester: Secondary | ICD-10-CM

## 2017-09-25 DIAGNOSIS — F329 Major depressive disorder, single episode, unspecified: Secondary | ICD-10-CM

## 2017-09-25 DIAGNOSIS — Z3A29 29 weeks gestation of pregnancy: Secondary | ICD-10-CM

## 2017-09-25 DIAGNOSIS — O99213 Obesity complicating pregnancy, third trimester: Secondary | ICD-10-CM

## 2017-09-25 DIAGNOSIS — F32A Depression, unspecified: Secondary | ICD-10-CM

## 2017-09-25 DIAGNOSIS — F419 Anxiety disorder, unspecified: Secondary | ICD-10-CM

## 2017-09-25 DIAGNOSIS — Z6838 Body mass index (BMI) 38.0-38.9, adult: Secondary | ICD-10-CM

## 2017-09-25 NOTE — Progress Notes (Signed)
Routine Prenatal Care Visit  Subjective  Melinda Cortez is a 37 y.o. G3P1011 at [redacted]w[redacted]d being seen today for ongoing prenatal care.  She is currently monitored for the following issues for this high-risk pregnancy and has Anxiety and depression; Allergic rhinitis; Elevated BP without diagnosis of hypertension; Migraine; Encounter for supervision of high risk multigravida of advanced maternal age, antepartum; Obesity affecting pregnancy; and BMI 38.0-38.9,adult on their problem list.  ----------------------------------------------------------------------------------- Patient reports no complaints.   Contractions: Not present. Vag. Bleeding: None.  Movement: Present. Denies leaking of fluid.  ----------------------------------------------------------------------------------- The following portions of the patient's history were reviewed and updated as appropriate: allergies, current medications, past family history, past medical history, past social history, past surgical history and problem list. Problem list updated.   Objective  Blood pressure 122/70, weight 251 lb (113.9 kg), last menstrual period 02/10/2017, unknown if currently breastfeeding. Pregravid weight 238 lb (108 kg) Total Weight Gain 13 lb (5.897 kg) Urinalysis: Urine Protein: Negative Urine Glucose: Negative  Fetal Status: Fetal Heart Rate (bpm): 145 Fundal Height: 31 cm Movement: Present     General:  Alert, oriented and cooperative. Patient is in no acute distress.  Skin: Skin is warm and dry. No rash noted.   Cardiovascular: Normal heart rate noted  Respiratory: Normal respiratory effort, no problems with respiration noted  Abdomen: Soft, gravid, appropriate for gestational age. Pain/Pressure: Absent     Pelvic:  Cervical exam deferred        Extremities: Normal range of motion.     Mental Status: Normal mood and affect. Normal behavior. Normal judgment and thought content.   Assessment   37 y.o. G3P1011 at [redacted]w[redacted]d by   12/06/2017, by Ultrasound presenting for routine prenatal visit  Plan   pregnancy #3 Problems (from 04/02/17 to present)    Problem Noted Resolved   Obesity affecting pregnancy 09/09/2017 by Will Bonnet, MD No   BMI 38.0-38.9,adult 09/09/2017 by Will Bonnet, MD No   Encounter for supervision of high risk multigravida of advanced maternal age, antepartum 04/06/2017 by Dalia Heading, CNM No   Overview Addendum 09/25/2017  9:16 AM by Will Bonnet, MD     Clinic [facility] Prenatal Labs  Dating 6 week [place] Blood type: O/Positive/-- (07/11 0949)   Genetic Screen NIPS: normal XY Antibody:Negative (07/11 0949)  Anatomic [place] Complete 07/15/2017 Rubella: 5.03 (07/11 0949)/ Varicella immune  GTT Early: 85 Third trimester: 131 RPR: Non Reactive (07/11 0949)   Flu vaccine 07/15/17 HBsAg: Negative (07/11 0949)   TDaP vaccine                                               Rhogam: N/A HIV: Non Reactive (07/11 0949)   Baby Food                                               GBS: (For PCN allergy, check sensitivities)  Contraception  Pap:NIL/neg 11/27/2016  Circumcision  GC/Chlamydia neg/neg  Pediatrician    Support Person             Elevated BP without diagnosis of hypertension 01/14/2017 by Leone Haven, MD No   Anxiety and depression 01/30/2016 by Leone Haven, MD No   Nausea and  vomiting during pregnancy 05/29/2017 by Will Bonnet, MD 08/12/2017 by Malachy Mood, MD   Overview Signed 05/29/2017  5:49 PM by Will Bonnet, MD    - failed Diclegis - taking zofran, but is now @ 12 weeks, not working as well - add vit b6 for now. Other options discussed (phenergan, reglan)      Pregnancy 04/15/2017 by Leone Haven, MD 08/12/2017 by Malachy Mood, MD      Preterm labor symptoms and general obstetric precautions including but not limited to vaginal bleeding, contractions, leaking of fluid and fetal movement were reviewed in detail with the patient. Please  refer to After Visit Summary for other counseling recommendations.   Return in about 2 weeks (around 10/09/2017) for Routine Prenatal Appointment.  -TDaP next visit  Prentice Docker, MD  09/25/2017 9:24 AM

## 2017-09-25 NOTE — Patient Instructions (Signed)

## 2017-09-30 ENCOUNTER — Ambulatory Visit: Payer: 59 | Admitting: Psychology

## 2017-09-30 DIAGNOSIS — F4323 Adjustment disorder with mixed anxiety and depressed mood: Secondary | ICD-10-CM

## 2017-10-07 ENCOUNTER — Ambulatory Visit (INDEPENDENT_AMBULATORY_CARE_PROVIDER_SITE_OTHER): Payer: Managed Care, Other (non HMO) | Admitting: Obstetrics and Gynecology

## 2017-10-07 VITALS — BP 124/76 | Wt 250.0 lb

## 2017-10-07 DIAGNOSIS — Z23 Encounter for immunization: Secondary | ICD-10-CM

## 2017-10-07 DIAGNOSIS — Z3A31 31 weeks gestation of pregnancy: Secondary | ICD-10-CM | POA: Diagnosis not present

## 2017-10-07 NOTE — Progress Notes (Signed)
ROB TDAP given/blood form signed

## 2017-10-07 NOTE — Progress Notes (Signed)
Routine Prenatal Care Visit  Subjective  Melinda Cortez is a 37 y.o. G3P1011 at [redacted]w[redacted]d being seen today for ongoing prenatal care.  She is currently monitored for the following issues for this high-risk pregnancy and has Anxiety and depression; Allergic rhinitis; Elevated BP without diagnosis of hypertension; Migraine; Encounter for supervision of high risk multigravida of advanced maternal age, antepartum; Obesity affecting pregnancy; and BMI 38.0-38.9,adult on their problem list.  ----------------------------------------------------------------------------------- Patient reports no complaints.   Contractions: Not present. Vag. Bleeding: None.  Movement: Present. Denies leaking of fluid.  ----------------------------------------------------------------------------------- The following portions of the patient's history were reviewed and updated as appropriate: allergies, current medications, past family history, past medical history, past social history, past surgical history and problem list. Problem list updated.   Objective  Blood pressure 124/76, weight 250 lb (113.4 kg), last menstrual period 02/10/2017, unknown if currently breastfeeding. Pregravid weight 238 lb (108 kg) Total Weight Gain 12 lb (5.443 kg)  Body mass index is 40.35 kg/m.  Urinalysis: Urine Protein: Negative Urine Glucose: Negative  Fetal Status: Fetal Heart Rate (bpm): 140 Fundal Height: 32 cm Movement: Present     General:  Alert, oriented and cooperative. Patient is in no acute distress.  Skin: Skin is warm and dry. No rash noted.   Cardiovascular: Normal heart rate noted  Respiratory: Normal respiratory effort, no problems with respiration noted  Abdomen: Soft, gravid, appropriate for gestational age. Pain/Pressure: Absent     Pelvic:  Cervical exam deferred        Extremities: Normal range of motion.     ental Status: Normal mood and affect. Normal behavior. Normal judgment and thought content.      Assessment   37 y.o. G3P1011 at [redacted]w[redacted]d by  12/06/2017, by Ultrasound presenting for routine prenatal visit  Plan   pregnancy #3 Problems (from 04/02/17 to present)    Problem Noted Resolved   Obesity affecting pregnancy 09/09/2017 by Will Bonnet, MD No   BMI 38.0-38.9,adult 09/09/2017 by Will Bonnet, MD No   Encounter for supervision of high risk multigravida of advanced maternal age, antepartum 04/06/2017 by Dalia Heading, CNM No   Overview Addendum 09/25/2017  9:16 AM by Will Bonnet, MD     Clinic [facility] Prenatal Labs  Dating 6 week [place] Blood type: O/Positive/-- (07/11 0949)   Genetic Screen NIPS: normal XY Antibody:Negative (07/11 0949)  Anatomic [place] Complete 07/15/2017 Rubella: 5.03 (07/11 0949)/ Varicella immune  GTT Early: 85 Third trimester: 131 RPR: Non Reactive (07/11 0949)   Flu vaccine 07/15/17 HBsAg: Negative (07/11 0949)   TDaP vaccine                                               Rhogam: N/A HIV: Non Reactive (07/11 0949)   Baby Food                                               GBS: (For PCN allergy, check sensitivities)  Contraception  Pap:NIL/neg 11/27/2016  Circumcision  GC/Chlamydia neg/neg  Pediatrician    Support Person             Elevated BP without diagnosis of hypertension 01/14/2017 by Leone Haven, MD No   Anxiety and  depression 01/30/2016 by Leone Haven, MD No   Nausea and vomiting during pregnancy 05/29/2017 by Will Bonnet, MD 08/12/2017 by Malachy Mood, MD   Overview Signed 05/29/2017  5:49 PM by Will Bonnet, MD    - failed Diclegis - taking zofran, but is now @ 12 weeks, not working as well - add vit b6 for now. Other options discussed (phenergan, reglan)      Pregnancy 04/15/2017 by Leone Haven, MD 08/12/2017 by Malachy Mood, MD       Preterm labor symptoms and general obstetric precautions including but not limited to vaginal bleeding, contractions, leaking of fluid and fetal  movement were reviewed in detail with the patient. Please refer to After Visit Summary for other counseling recommendations.  - BMI <40 at start of pregnancy now over   Return in about 2 weeks (around 10/21/2017) for ROB.

## 2017-10-16 ENCOUNTER — Observation Stay
Admission: RE | Admit: 2017-10-16 | Discharge: 2017-10-16 | Disposition: A | Discharge status: Home / Self Care | Payer: Managed Care, Other (non HMO) | Attending: Obstetrics and Gynecology | Admitting: Obstetrics and Gynecology

## 2017-10-16 ENCOUNTER — Encounter: Payer: Self-pay | Admitting: Family Medicine

## 2017-10-16 ENCOUNTER — Ambulatory Visit: Payer: Managed Care, Other (non HMO) | Admitting: Family Medicine

## 2017-10-16 ENCOUNTER — Other Ambulatory Visit: Payer: Self-pay

## 2017-10-16 DIAGNOSIS — F419 Anxiety disorder, unspecified: Secondary | ICD-10-CM | POA: Insufficient documentation

## 2017-10-16 DIAGNOSIS — Z79899 Other long term (current) drug therapy: Secondary | ICD-10-CM | POA: Diagnosis not present

## 2017-10-16 DIAGNOSIS — O09523 Supervision of elderly multigravida, third trimester: Secondary | ICD-10-CM | POA: Insufficient documentation

## 2017-10-16 DIAGNOSIS — J069 Acute upper respiratory infection, unspecified: Secondary | ICD-10-CM | POA: Diagnosis not present

## 2017-10-16 DIAGNOSIS — O163 Unspecified maternal hypertension, third trimester: Secondary | ICD-10-CM

## 2017-10-16 DIAGNOSIS — R030 Elevated blood-pressure reading, without diagnosis of hypertension: Secondary | ICD-10-CM | POA: Diagnosis not present

## 2017-10-16 DIAGNOSIS — Z87891 Personal history of nicotine dependence: Secondary | ICD-10-CM | POA: Diagnosis not present

## 2017-10-16 DIAGNOSIS — F329 Major depressive disorder, single episode, unspecified: Secondary | ICD-10-CM | POA: Diagnosis not present

## 2017-10-16 DIAGNOSIS — Z3A32 32 weeks gestation of pregnancy: Secondary | ICD-10-CM | POA: Insufficient documentation

## 2017-10-16 DIAGNOSIS — G43909 Migraine, unspecified, not intractable, without status migrainosus: Secondary | ICD-10-CM | POA: Insufficient documentation

## 2017-10-16 DIAGNOSIS — R51 Headache: Secondary | ICD-10-CM | POA: Diagnosis not present

## 2017-10-16 DIAGNOSIS — O99343 Other mental disorders complicating pregnancy, third trimester: Secondary | ICD-10-CM | POA: Insufficient documentation

## 2017-10-16 DIAGNOSIS — O26893 Other specified pregnancy related conditions, third trimester: Principal | ICD-10-CM | POA: Insufficient documentation

## 2017-10-16 DIAGNOSIS — J309 Allergic rhinitis, unspecified: Secondary | ICD-10-CM | POA: Diagnosis not present

## 2017-10-16 HISTORY — DX: Unspecified maternal hypertension, third trimester: O16.3

## 2017-10-16 LAB — CBC
HEMATOCRIT: 36.4 % (ref 35.0–47.0)
HEMOGLOBIN: 12.1 g/dL (ref 12.0–16.0)
MCH: 30.6 pg (ref 26.0–34.0)
MCHC: 33.3 g/dL (ref 32.0–36.0)
MCV: 91.9 fL (ref 80.0–100.0)
Platelets: 173 10*3/uL (ref 150–440)
RBC: 3.96 MIL/uL (ref 3.80–5.20)
RDW: 13.6 % (ref 11.5–14.5)
WBC: 6.8 10*3/uL (ref 3.6–11.0)

## 2017-10-16 LAB — COMPREHENSIVE METABOLIC PANEL
ALBUMIN: 2.8 g/dL — AB (ref 3.5–5.0)
ALK PHOS: 85 U/L (ref 38–126)
ALT: 20 U/L (ref 14–54)
AST: 31 U/L (ref 15–41)
Anion gap: 7 (ref 5–15)
BILIRUBIN TOTAL: 0.6 mg/dL (ref 0.3–1.2)
BUN: <5 mg/dL — ABNORMAL LOW (ref 6–20)
CALCIUM: 9 mg/dL (ref 8.9–10.3)
CO2: 22 mmol/L (ref 22–32)
CREATININE: 0.38 mg/dL — AB (ref 0.44–1.00)
Chloride: 106 mmol/L (ref 101–111)
GFR calc Af Amer: >60 mL/min (ref >60)
GFR calc non Af Amer: >60 mL/min (ref >60)
GLUCOSE: 96 mg/dL (ref 65–99)
Potassium: 3.6 mmol/L (ref 3.5–5.1)
SODIUM: 135 mmol/L (ref 135–145)
Total Protein: 6.1 g/dL — ABNORMAL LOW (ref 6.5–8.1)

## 2017-10-16 LAB — PROTEIN / CREATININE RATIO, URINE
Creatinine, Urine: 245 mg/dL
PROTEIN CREATININE RATIO: 0.08 mg/mg{creat} (ref 0.00–0.15)
TOTAL PROTEIN, URINE: 19 mg/dL

## 2017-10-16 MED ORDER — ACETAMINOPHEN 325 MG PO TABS: 650.0000 mg | ORAL_TABLET | ORAL | Status: DC | PRN

## 2017-10-16 NOTE — Progress Notes (Signed)
  Tommi Rumps, MD Phone: [phone]  Melinda Cortez is a 37 y.o. female who presents today for follow-up.  Depression/anxiety: Notes this is not too bad.  She is remain on Wellbutrin and Zoloft while being pregnant.  Note some congestion for a couple of days in her chest.  Some mild sore throat.  Mild cough.  She is been breathing okay.  No chest pain.  Blood pressure is up a little bit today in our office it has been high though since she has become pregnant and is at the gynecologist office.  Patient is 32 weeks and 5 days as a G3 P1011.  She does feel baby moving.  No vaginal bleeding.  Social History   Tobacco Use  Smoking Status Former Smoker  . Packs/day: 0.25  . Years: 16.00  . Pack years: 4.00  . Types: Cigarettes  . Last attempt to quit: 01/07/2015  . Years since quitting: 2.7  Smokeless Tobacco Never Used     ROS see history of present illness  Objective  Physical Exam Vitals:   10/16/17 0816  BP: (!) 130/98  Pulse: (!) 104  Temp: 98.1 F (36.7 C)  SpO2: 97%    BP Readings from Last 3 Encounters:  10/16/17 111/73  10/16/17 (!) 130/98  10/07/17 124/76   Wt Readings from Last 3 Encounters:  10/16/17 252 lb (114.3 kg)  10/16/17 252 lb 9.6 oz (114.6 kg)  10/07/17 250 lb (113.4 kg)    Physical Exam  Constitutional: No distress.  HENT:  Head: Normocephalic and atraumatic.  Mouth/Throat: Oropharynx is clear and moist. No oropharyngeal exudate.  Eyes: Conjunctivae are normal. Pupils are equal, round, and reactive to light.  Cardiovascular: Normal rate, regular rhythm and normal heart sounds.  Pulmonary/Chest: Effort normal and breath sounds normal.  Musculoskeletal: She exhibits no edema.  Neurological: She is alert. Gait normal.  Skin: Skin is warm and dry. She is not diaphoretic.     Assessment/Plan: Please see individual problem list.  Elevated blood pressure affecting pregnancy in third trimester, antepartum Blood pressure elevated  today.  She is in her third trimester.  I contacted her OBs office and they advised having her go to the labor and delivery department to be monitored to watch her blood pressure as well as check lab work.  She will head to the labor and delivery department at [facility].  Anxiety and depression Well-controlled.  She will continue to monitor.  URI (upper respiratory infection) Symptoms consistent with upper respiratory viral infection.  No focal findings to indicate bacterial illness.  She will continue to monitor.   No orders of the defined types were placed in this encounter.   No orders of the defined types were placed in this encounter.    Tommi Rumps, MD [place]

## 2017-10-16 NOTE — OB Triage Note (Signed)
Patient discharged home ambulatory and in stable condition accompanied by her husband after verbalizing understanding of all written discharge instructions.  AVS provided to patient.

## 2017-10-16 NOTE — Patient Instructions (Signed)
Nice to see you. Please head over to labor and delivery at [facility] for monitoring and evaluation.

## 2017-10-16 NOTE — Assessment & Plan Note (Signed)
Well-controlled.  She will continue to monitor.

## 2017-10-16 NOTE — Assessment & Plan Note (Signed)
Symptoms consistent with upper respiratory viral infection.  No focal findings to indicate bacterial illness.  She will continue to monitor.

## 2017-10-16 NOTE — Assessment & Plan Note (Signed)
Blood pressure elevated today.  She is in her third trimester.  I contacted her OBs office and they advised having her go to the labor and delivery department to be monitored to watch her blood pressure as well as check lab work.  She will head to the labor and delivery department at [facility].

## 2017-10-16 NOTE — Discharge Summary (Signed)
See Final Progress Note 10/16/2017.  Avel Sensor, CNM 10/16/2017  10:53 AM

## 2017-10-16 NOTE — Final Progress Note (Signed)
Physician Final Progress Note  Patient ID: Melinda Cortez MRN: [id number] DOB/AGE: 1981-02-24 37 y.o.  Admit date: 10/16/2017 Admitting provider: Homero Fellers, MD Discharge date: 10/16/2017   Admission Diagnoses: Elevated blood pressure, antepartum  Discharge Diagnoses:  Active Problems: High risk pregnancy, Advanced maternal age, Migraine, Anxiety and Depression, Allergic rhinitis     History of Present Illness: The patient is a 37 y.o. female G64P1011 at [redacted]w[redacted]d who presents for elevated blood pressure (130/98) during an office visit with her primary care provider this morning. She denies headache, visual changes, pain, loss of fluid, vaginal bleeding, and contractions. She endorses good fetal movement.  10 point review of systems negative unless otherwise noted in HPI.  Past Medical History:  Diagnosis Date  . Allergic rhinitis   . Anxiety   . Chronic headaches    HORMONAL   . Complication of anesthesia   . Depression   . Elevated blood pressure   . PONV (postoperative nausea and vomiting)     Past Surgical History:  Procedure Laterality Date  . DILATION AND CURETTAGE OF UTERUS    . DILATION AND EVACUATION N/A 01/07/2017   Procedure: DILATATION AND EVACUATION;  Surgeon: Malachy Mood, MD;  Location: [facility] ORS;  Service: Gynecology;  Laterality: N/A;  . TONSILLECTOMY AND ADENOIDECTOMY  1994    No current facility-administered medications on file prior to encounter.    Current Outpatient Medications on File Prior to Encounter  Medication Sig Dispense Refill  . buPROPion (WELLBUTRIN XL) 150 MG 24 hr tablet Take 1 tablet (150 mg total) daily by mouth. 90 tablet 1  . cetirizine (ZYRTEC) 10 MG tablet Take 10 mg by mouth every morning.     . Prenatal Vit-Fe Fumarate-FA (PRENATAL MULTIVITAMIN) TABS tablet Take 1 tablet by mouth daily.     . sertraline (ZOLOFT) 100 MG tablet Take 50 mg by mouth every morning.     . clobetasol ointment (TEMOVATE) 5.18 % Apply 1  application topically 2 (two) times daily as needed (lichen).     . diphenhydrAMINE (BENADRYL) 25 MG tablet Take 25 mg by mouth at bedtime as needed for sleep.    . metoCLOPramide (REGLAN) 10 MG tablet Take 1 tablet (10 mg total) by mouth every 8 (eight) hours as needed for nausea or vomiting. (Patient not taking: Reported on 10/16/2017) 90 tablet 1  . Omeprazole (PRILOSEC PO) Take by mouth.    . ondansetron (ZOFRAN ODT) 4 MG disintegrating tablet Take 1 tablet (4 mg total) by mouth every 6 (six) hours as needed for nausea. (Patient not taking: Reported on 10/16/2017) 30 tablet 1  . sodium chloride (OCEAN) 0.65 % SOLN nasal spray Place 1 spray into both nostrils as needed for congestion.      No Known Allergies  Social History   Socioeconomic History  . Marital status: Married    Spouse name: Not on file  . Number of children: 1  . Years of education: Not on file  . Highest education level: Not on file  Social Needs  . Financial resource strain: Not on file  . Food insecurity - worry: Not on file  . Food insecurity - inability: Not on file  . Transportation needs - medical: Not on file  . Transportation needs - non-medical: Not on file  Occupational History  . Not on file  Tobacco Use  . Smoking status: Former Smoker    Packs/day: 0.25    Years: 16.00    Pack years: 4.00    Types:  Cigarettes    Last attempt to quit: 01/07/2015    Years since quitting: 2.7  . Smokeless tobacco: Never Used  Substance and Sexual Activity  . Alcohol use: Yes    Alcohol/week: 0.0 oz    Comment: rare  . Drug use: No  . Sexual activity: Yes    Birth control/protection: None  Other Topics Concern  . Not on file  Social History Narrative  . Not on file    Physical Exam: BP 111/73   Pulse 81   Temp 98.4 F (36.9 C) (Oral)   Resp 16   Ht 5\' 7"  (1.702 m)   Wt 252 lb (114.3 kg)   LMP 02/10/2017 (Exact Date)   BMI 39.47 kg/m   Gen: NAD, alert and oriented Pelvic: deferred Ext: non pitting  edema BLE, no signs of DVT Psych: appropriate  Baseline: 140 Variability: moderate Accelerations: present Decelerations: absent Tocometry: occasional contractions The patient was monitored for 30+ minutes, fetal heart rate tracing was deemed reactive, category I tracing.  Consults: None  Significant Findings/ Diagnostic Studies: labs: no evidence of pre-eclampsia  Results for orders placed or performed during the hospital encounter of 10/16/17 (from the past 24 hour(s))  Protein / creatinine ratio, urine     Status: None   Collection Time: 10/16/17  9:18 AM  Result Value Ref Range   Creatinine, Urine 245 mg/dL   Total Protein, Urine 19 mg/dL   Protein Creatinine Ratio 0.08 0.00 - 0.15 mg/mg[Cre]  CBC     Status: None   Collection Time: 10/16/17  9:18 AM  Result Value Ref Range   WBC 6.8 3.6 - 11.0 K/uL   RBC 3.96 3.80 - 5.20 MIL/uL   Hemoglobin 12.1 12.0 - 16.0 g/dL   HCT 36.4 35.0 - 47.0 %   MCV 91.9 80.0 - 100.0 fL   MCH 30.6 26.0 - 34.0 pg   MCHC 33.3 32.0 - 36.0 g/dL   RDW 13.6 11.5 - 14.5 %   Platelets 173 150 - 440 K/uL  Comprehensive metabolic panel     Status: Abnormal   Collection Time: 10/16/17  9:18 AM  Result Value Ref Range   Sodium 135 135 - 145 mmol/L   Potassium 3.6 3.5 - 5.1 mmol/L   Chloride 106 101 - 111 mmol/L   CO2 22 22 - 32 mmol/L   Glucose, Bld 96 65 - 99 mg/dL   BUN <5 (L) 6 - 20 mg/dL   Creatinine, Ser 0.38 (L) 0.44 - 1.00 mg/dL   Calcium 9.0 8.9 - 10.3 mg/dL   Total Protein 6.1 (L) 6.5 - 8.1 g/dL   Albumin 2.8 (L) 3.5 - 5.0 g/dL   AST 31 15 - 41 U/L   ALT 20 14 - 54 U/L   Alkaline Phosphatase 85 38 - 126 U/L   Total Bilirubin 0.6 0.3 - 1.2 mg/dL   GFR calc non Af Amer >60 >60 mL/min   GFR calc Af Amer >60 >60 mL/min   Anion gap 7 5 - 15    Procedures: NST, reactive  Discharge Condition: good  Disposition: 01-Home or Self Care  Diet: Regular diet  Discharge Activity: Activity as tolerated  Discharge Instructions    Discharge  activity:  No Restrictions   Complete by:  As directed    Discharge diet:  No restrictions   Complete by:  As directed    Fetal Kick Count:  Lie on our left side for one hour after a meal, and count the number  of times your baby kicks.  If it is less than 5 times, get up, move around and drink some juice.  Repeat the test 30 minutes later.  If it is still less than 5 kicks in an hour, notify your doctor.   Complete by:  As directed    No sexual activity restrictions   Complete by:  As directed    Notify physician for a general feeling that "something is not right"   Complete by:  As directed    Notify physician for increase or change in vaginal discharge   Complete by:  As directed    Notify physician for intestinal cramps, with or without diarrhea, sometimes described as "gas pain"   Complete by:  As directed    Notify physician for leaking of fluid   Complete by:  As directed    Notify physician for low, dull backache, unrelieved by heat or Tylenol   Complete by:  As directed    Notify physician for menstrual like cramps   Complete by:  As directed    Notify physician for pelvic pressure   Complete by:  As directed    Notify physician for uterine contractions.  These may be painless and feel like the uterus is tightening or the baby is  "balling up"   Complete by:  As directed    Notify physician for vaginal bleeding   Complete by:  As directed    PRETERM LABOR:  Includes any of the follwing symptoms that occur between 20 - [redacted] weeks gestation.  If these symptoms are not stopped, preterm labor can result in preterm delivery, placing your baby at risk   Complete by:  As directed      Allergies as of 10/16/2017   No Known Allergies     Medication List    TAKE these medications   buPROPion 150 MG 24 hr tablet Commonly known as:  WELLBUTRIN XL Take 1 tablet (150 mg total) daily by mouth.   cetirizine 10 MG tablet Commonly known as:  ZYRTEC Take 10 mg by mouth every morning.    clobetasol ointment 0.05 % Commonly known as:  TEMOVATE Apply 1 application topically 2 (two) times daily as needed (lichen).   diphenhydrAMINE 25 MG tablet Commonly known as:  BENADRYL Take 25 mg by mouth at bedtime as needed for sleep.   metoCLOPramide 10 MG tablet Commonly known as:  REGLAN Take 1 tablet (10 mg total) by mouth every 8 (eight) hours as needed for nausea or vomiting.   ondansetron 4 MG disintegrating tablet Commonly known as:  ZOFRAN ODT Take 1 tablet (4 mg total) by mouth every 6 (six) hours as needed for nausea.   prenatal multivitamin Tabs tablet Take 1 tablet by mouth daily.   PRILOSEC PO Take by mouth.   sertraline 100 MG tablet Commonly known as:  ZOLOFT Take 50 mg by mouth every morning.   sodium chloride 0.65 % Soln nasal spray Commonly known as:  OCEAN Place 1 spray into both nostrils as needed for congestion.        Total time spent taking care of this patient: 10 minutes  Signed: Rexene Agent, CNM  10/16/2017, 10:32 AM

## 2017-10-23 ENCOUNTER — Ambulatory Visit (INDEPENDENT_AMBULATORY_CARE_PROVIDER_SITE_OTHER): Payer: Managed Care, Other (non HMO) | Admitting: Obstetrics and Gynecology

## 2017-10-23 ENCOUNTER — Other Ambulatory Visit: Payer: Self-pay | Admitting: Family Medicine

## 2017-10-23 ENCOUNTER — Encounter: Payer: Self-pay | Admitting: Family Medicine

## 2017-10-23 VITALS — BP 124/88 | Wt 252.0 lb

## 2017-10-23 DIAGNOSIS — O09529 Supervision of elderly multigravida, unspecified trimester: Secondary | ICD-10-CM

## 2017-10-23 DIAGNOSIS — O99213 Obesity complicating pregnancy, third trimester: Secondary | ICD-10-CM

## 2017-10-23 DIAGNOSIS — Z3A33 33 weeks gestation of pregnancy: Secondary | ICD-10-CM

## 2017-10-23 DIAGNOSIS — R030 Elevated blood-pressure reading, without diagnosis of hypertension: Secondary | ICD-10-CM

## 2017-10-23 DIAGNOSIS — O163 Unspecified maternal hypertension, third trimester: Secondary | ICD-10-CM

## 2017-10-23 NOTE — Progress Notes (Signed)
ROB

## 2017-10-23 NOTE — Progress Notes (Signed)
Routine Prenatal Care Visit  Subjective  Melinda Cortez is a 37 y.o. G3P1011 at [redacted]w[redacted]d being seen today for ongoing prenatal care.  She is currently monitored for the following issues for this high-risk pregnancy and has Anxiety and depression; Allergic rhinitis; Elevated BP without diagnosis of hypertension; Migraine; Encounter for supervision of high risk multigravida of advanced maternal age, antepartum; Obesity affecting pregnancy; BMI 38.0-38.9,adult; Elevated blood pressure affecting pregnancy in third trimester, antepartum; and URI (upper respiratory infection) on their problem list.  ----------------------------------------------------------------------------------- Patient reports no complaints.   Contractions: Not present. Vag. Bleeding: None.  Movement: Present. Denies leaking of fluid.  ----------------------------------------------------------------------------------- The following portions of the patient's history were reviewed and updated as appropriate: allergies, current medications, past family history, past medical history, past social history, past surgical history and problem list. Problem list updated.   Objective  Blood pressure 124/88, weight 252 lb (114.3 kg), last menstrual period 02/10/2017, unknown if currently breastfeeding. Pregravid weight 238 lb (108 kg) Total Weight Gain 14 lb (6.35 kg) Urinalysis: Urine Protein: Trace Urine Glucose: Negative  Fetal Status: Fetal Heart Rate (bpm): 145 Fundal Height: 34 cm Movement: Present     General:  Alert, oriented and cooperative. Patient is in no acute distress.  Skin: Skin is warm and dry. No rash noted.   Cardiovascular: Normal heart rate noted  Respiratory: Normal respiratory effort, no problems with respiration noted  Abdomen: Soft, gravid, appropriate for gestational age. Pain/Pressure: Absent     Pelvic:  Cervical exam deferred        Extremities: Normal range of motion.     ental Status: Normal mood and  affect. Normal behavior. Normal judgment and thought content.     Assessment   37 y.o. G3P1011 at [redacted]w[redacted]d by  12/06/2017, by Ultrasound presenting for routine prenatal visit  Plan   pregnancy #3 Problems (from 04/02/17 to present)    Problem Noted Resolved   Obesity affecting pregnancy 09/09/2017 by Will Bonnet, MD No   BMI 38.0-38.9,adult 09/09/2017 by Will Bonnet, MD No   Encounter for supervision of high risk multigravida of advanced maternal age, antepartum 04/06/2017 by Dalia Heading, [place] No   Overview Addendum 09/25/2017  9:16 AM by Will Bonnet, MD     Clinic [facility] Prenatal Labs  Dating 6 week [place] Blood type: O/Positive/-- (07/11 0949)   Genetic Screen NIPS: normal XY Antibody:Negative (07/11 0949)  Anatomic [place] Complete 07/15/2017 Rubella: 5.03 (07/11 0949)/ Varicella immune  GTT Early: 85 Third trimester: 131 RPR: Non Reactive (07/11 0949)   Flu vaccine 07/15/17 HBsAg: Negative (07/11 0949)   TDaP vaccine 10/07/17                                          Rhogam: N/A HIV: Non Reactive (07/11 0949)   Baby Food                                               GBS: (For PCN allergy, check sensitivities)  Contraception  Pap:NIL/neg 11/27/2016  Circumcision  GC/Chlamydia neg/neg  Pediatrician    Support Person             Elevated BP without diagnosis of hypertension 01/14/2017 by Leone Haven, MD No   Anxiety  and depression 01/30/2016 by Leone Haven, MD No   Nausea and vomiting during pregnancy 05/29/2017 by Will Bonnet, MD 08/12/2017 by Malachy Mood, MD   Overview Signed 05/29/2017  5:49 PM by Will Bonnet, MD    - failed Diclegis - taking zofran, but is now @ 12 weeks, not working as well - add vit b6 for now. Other options discussed (phenergan, reglan)      Pregnancy 04/15/2017 by Leone Haven, MD 08/12/2017 by Malachy Mood, MD       Preterm labor symptoms and general obstetric precautions including but not limited to  vaginal bleeding, contractions, leaking of fluid and fetal movement were reviewed in detail with the patient. Please refer to After Visit Summary for other counseling recommendations.   Return in about 2 weeks (around 11/06/2017) for ROB/NST/Growth scan.

## 2017-10-24 MED ORDER — BUPROPION HCL ER (XL) 150 MG PO TB24: 150.0000 mg | ORAL_TABLET | Freq: Every day | ORAL | 1 refills | Status: DC

## 2017-10-28 ENCOUNTER — Ambulatory Visit: Payer: 59 | Admitting: Psychology

## 2017-10-28 DIAGNOSIS — F4323 Adjustment disorder with mixed anxiety and depressed mood: Secondary | ICD-10-CM

## 2017-10-31 ENCOUNTER — Observation Stay
Admission: EM | Admit: 2017-10-31 | Discharge: 2017-10-31 | Disposition: A | Discharge status: Home / Self Care | Payer: Managed Care, Other (non HMO) | Attending: Obstetrics and Gynecology | Admitting: Obstetrics and Gynecology

## 2017-10-31 ENCOUNTER — Telehealth: Payer: Self-pay

## 2017-10-31 ENCOUNTER — Encounter: Payer: Self-pay | Admitting: *Deleted

## 2017-10-31 DIAGNOSIS — F419 Anxiety disorder, unspecified: Secondary | ICD-10-CM | POA: Insufficient documentation

## 2017-10-31 DIAGNOSIS — O09523 Supervision of elderly multigravida, third trimester: Secondary | ICD-10-CM | POA: Insufficient documentation

## 2017-10-31 DIAGNOSIS — O99343 Other mental disorders complicating pregnancy, third trimester: Secondary | ICD-10-CM | POA: Insufficient documentation

## 2017-10-31 DIAGNOSIS — O9989 Other specified diseases and conditions complicating pregnancy, childbirth and the puerperium: Secondary | ICD-10-CM | POA: Diagnosis not present

## 2017-10-31 DIAGNOSIS — Z79899 Other long term (current) drug therapy: Secondary | ICD-10-CM | POA: Insufficient documentation

## 2017-10-31 DIAGNOSIS — Z3A34 34 weeks gestation of pregnancy: Secondary | ICD-10-CM | POA: Diagnosis not present

## 2017-10-31 DIAGNOSIS — F329 Major depressive disorder, single episode, unspecified: Secondary | ICD-10-CM | POA: Insufficient documentation

## 2017-10-31 DIAGNOSIS — R030 Elevated blood-pressure reading, without diagnosis of hypertension: Secondary | ICD-10-CM | POA: Diagnosis not present

## 2017-10-31 DIAGNOSIS — Z8249 Family history of ischemic heart disease and other diseases of the circulatory system: Secondary | ICD-10-CM | POA: Diagnosis not present

## 2017-10-31 DIAGNOSIS — R600 Localized edema: Secondary | ICD-10-CM | POA: Diagnosis not present

## 2017-10-31 DIAGNOSIS — O26893 Other specified pregnancy related conditions, third trimester: Secondary | ICD-10-CM | POA: Diagnosis not present

## 2017-10-31 DIAGNOSIS — Z87891 Personal history of nicotine dependence: Secondary | ICD-10-CM | POA: Diagnosis not present

## 2017-10-31 LAB — CBC
HEMATOCRIT: 38.4 % (ref 35.0–47.0)
HEMOGLOBIN: 13 g/dL (ref 12.0–16.0)
MCH: 31.1 pg (ref 26.0–34.0)
MCHC: 33.9 g/dL (ref 32.0–36.0)
MCV: 91.6 fL (ref 80.0–100.0)
Platelets: 145 10*3/uL — ABNORMAL LOW (ref 150–440)
RBC: 4.19 MIL/uL (ref 3.80–5.20)
RDW: 13.8 % (ref 11.5–14.5)
WBC: 7.6 10*3/uL (ref 3.6–11.0)

## 2017-10-31 LAB — COMPREHENSIVE METABOLIC PANEL
ALK PHOS: 114 U/L (ref 38–126)
ALT: 17 U/L (ref 14–54)
AST: 25 U/L (ref 15–41)
Albumin: 3 g/dL — ABNORMAL LOW (ref 3.5–5.0)
Anion gap: 11 (ref 5–15)
BILIRUBIN TOTAL: 0.6 mg/dL (ref 0.3–1.2)
BUN: 7 mg/dL (ref 6–20)
CALCIUM: 9.4 mg/dL (ref 8.9–10.3)
CO2: 21 mmol/L — ABNORMAL LOW (ref 22–32)
CREATININE: 0.53 mg/dL (ref 0.44–1.00)
Chloride: 104 mmol/L (ref 101–111)
GFR calc Af Amer: >60 mL/min (ref >60)
GFR calc non Af Amer: >60 mL/min (ref >60)
GLUCOSE: 73 mg/dL (ref 65–99)
Potassium: 3.7 mmol/L (ref 3.5–5.1)
Sodium: 136 mmol/L (ref 135–145)
TOTAL PROTEIN: 6.5 g/dL (ref 6.5–8.1)

## 2017-10-31 LAB — PROTEIN / CREATININE RATIO, URINE
Creatinine, Urine: 125 mg/dL
Protein Creatinine Ratio: 0.06 mg/mg{Cre} (ref 0.00–0.15)
Total Protein, Urine: 7 mg/dL

## 2017-10-31 NOTE — Final Progress Note (Signed)
Physician Final Progress Note  Patient ID: Melinda Cortez MRN: [id number] DOB/AGE: January 03, 1981 37 y.o.  Admit date: 10/31/2017 Admitting provider: Will Bonnet, MD Discharge date: 10/31/2017  Admission Diagnoses:  1) intrauterine pregnancy at [redacted]w[redacted]d 2) concern for elevated blood pressures  Discharge Diagnoses:  1) intrauterine pregnancy at [redacted]w[redacted]d 2) elevated blood pressure in pregnancy, third trimester  History of Present Illness: The patient is a 37 y.o. female G3P1011 at [redacted]w[redacted]d who presents for elevated blood pressure readings at home. She states that she took a hospital tour three nights ago. Since that time she has had increased swelling in her feet. So, she began taking her blood pressure more frequently. She has been concerned about her blood pressure this pregnancy, as she had some elevated blood pressures prior to this pregnancy, though she has never received a diagnosis of hypertension. Of note, she had a visit on 05/15/17 where her blood pressure was 128/90 - she did not have a recheck and all other blood pressures have been normal.  She was seen several weeks ago with a similar concern and was reassured after observation on L&D.  Today she denies HAs, visual changes, and RUQ pain. She notes +FM, no LOF, and no vaginal bleeding or contractions.     Hospital Course: patient observed on L&D. She has all normal blood pressures while here, except for one (normal SBP, DBP=90). No abnormal lab or exam findings. Patient may have early gestational hypertension versus undiagnosed pre-existing hypertension with an early elevated BP in pregnancy.  Discussed plan of having her follow up in clinic early next week (next scheduled appointment is in 7 days) for a BP check. She was encouraged to bring her home BP machine for calibration purposes. Meanwhile, she was given precautions for headache, visual changes, RUQ pain and the usual labor precautions.   Past Medical History:  Diagnosis Date  .  Allergic rhinitis   . Anxiety   . Chronic headaches    HORMONAL   . Complication of anesthesia   . Depression   . Elevated blood pressure   . PONV (postoperative nausea and vomiting)     Past Surgical History:  Procedure Laterality Date  . DILATION AND CURETTAGE OF UTERUS    . DILATION AND EVACUATION N/A 01/07/2017   Procedure: DILATATION AND EVACUATION;  Surgeon: Malachy Mood, MD;  Location: [facility] ORS;  Service: Gynecology;  Laterality: N/A;  . TONSILLECTOMY AND ADENOIDECTOMY  1994    No current facility-administered medications on file prior to encounter.    Current Outpatient Medications on File Prior to Encounter  Medication Sig Dispense Refill  . buPROPion (WELLBUTRIN XL) 150 MG 24 hr tablet Take 1 tablet (150 mg total) by mouth daily. 90 tablet 1  . cetirizine (ZYRTEC) 10 MG tablet Take 10 mg by mouth every morning.     . clobetasol ointment (TEMOVATE) 4.19 % Apply 1 application topically 2 (two) times daily as needed (lichen).     . diphenhydrAMINE (BENADRYL) 25 MG tablet Take 25 mg by mouth at bedtime as needed for sleep.    . metoCLOPramide (REGLAN) 10 MG tablet Take 1 tablet (10 mg total) by mouth every 8 (eight) hours as needed for nausea or vomiting. (Patient not taking: Reported on 10/16/2017) 90 tablet 1  . Omeprazole (PRILOSEC PO) Take by mouth.    . ondansetron (ZOFRAN ODT) 4 MG disintegrating tablet Take 1 tablet (4 mg total) by mouth every 6 (six) hours as needed for nausea. (Patient not taking: Reported on  10/16/2017) 30 tablet 1  . Prenatal Vit-Fe Fumarate-FA (PRENATAL MULTIVITAMIN) TABS tablet Take 1 tablet by mouth daily.     . sertraline (ZOLOFT) 100 MG tablet Take 50 mg by mouth every morning.     . sodium chloride (OCEAN) 0.65 % SOLN nasal spray Place 1 spray into both nostrils as needed for congestion.      No Known Allergies  Social History   Socioeconomic History  . Marital status: Married    Spouse name: Not on file  . Number of children: 1  .  Years of education: Not on file  . Highest education level: Not on file  Social Needs  . Financial resource strain: Not on file  . Food insecurity - worry: Not on file  . Food insecurity - inability: Not on file  . Transportation needs - medical: Not on file  . Transportation needs - non-medical: Not on file  Occupational History  . Not on file  Tobacco Use  . Smoking status: Former Smoker    Packs/day: 0.25    Years: 16.00    Pack years: 4.00    Types: Cigarettes    Last attempt to quit: 01/07/2015    Years since quitting: 2.8  . Smokeless tobacco: Never Used  Substance and Sexual Activity  . Alcohol use: Yes    Alcohol/week: 0.0 oz    Comment: rare  . Drug use: No  . Sexual activity: Yes    Birth control/protection: None  Other Topics Concern  . Not on file  Social History Narrative  . Not on file   Family History  Problem Relation Age of Onset  . Alcoholism Unknown   . Breast cancer Maternal Aunt 35       Mothers aunt  . Lung cancer Unknown   . Hypertension Father   . Heart disease Father   . Hypertension Brother   . Hypertension Brother        prediabetes  . Lymphoma Paternal Grandfather   . Schizophrenia Maternal Uncle      Physical Exam: BP 117/61   Pulse 88   Temp 98.2 F (36.8 C) (Oral)   Resp 20   Ht 5\' 6"  (1.676 m)   Wt 250 lb (113.4 kg)   LMP 02/10/2017 (Exact Date)   BMI 40.35 kg/m   Physical Exam  Constitutional: She is oriented to person, place, and time. She appears well-developed and well-nourished. No distress.  HENT:  Head: Normocephalic and atraumatic.  Eyes: Conjunctivae are normal.  Cardiovascular: Normal rate, regular rhythm and normal heart sounds. Exam reveals no gallop and no friction rub.  No murmur heard. Pulmonary/Chest: Effort normal and breath sounds normal. She has no wheezes. She has no rales.  Abdominal: Soft. She exhibits no distension. There is no tenderness. There is no guarding. Hernia confirmed negative in the right  inguinal area and confirmed negative in the left inguinal area.  Gravid/nt  Genitourinary: Pelvic exam was performed with patient supine. There is no rash, tenderness or lesion on the right labia. There is no rash, tenderness or lesion on the left labia.  Musculoskeletal: Normal range of motion.  Lymphadenopathy:       Right: No inguinal adenopathy present.       Left: No inguinal adenopathy present.  Neurological: She is alert and oriented to person, place, and time. She displays normal reflexes.  Skin: Skin is warm and dry. No rash noted.  Psychiatric: She has a normal mood and affect. Her behavior is normal.  Judgment normal.     Consults: None  Significant Findings/ Diagnostic Studies:  Lab Results  Component Value Date   WBC 7.6 10/31/2017   HGB 13.0 10/31/2017   HCT 38.4 10/31/2017   PLT 145 (L) 10/31/2017   CREATININE 0.53 10/31/2017   ALT 17 10/31/2017   AST 25 10/31/2017   PROTCRRATIO 0.06 10/31/2017    Procedures: NST Baseline FHR: 135 beats/min Variability: moderate Accelerations: present Decelerations: absent Tocometry: occasional ctx (none felt by patient)  Interpretation:  INDICATIONS: elevated blood pressure at home RESULTS:  A NST procedure was performed with FHR monitoring and a normal baseline established, appropriate time of 20-40 minutes of evaluation, and accels >2 seen w 15x15 characteristics.  Results show a REACTIVE NST.    Discharge Condition: stable  Disposition: 01-Home or Self Care  Diet: Regular diet  Discharge Activity: Activity as tolerated   Allergies as of 10/31/2017   No Known Allergies     Medication List    STOP taking these medications   metoCLOPramide 10 MG tablet Commonly known as:  REGLAN   ondansetron 4 MG disintegrating tablet Commonly known as:  ZOFRAN ODT     TAKE these medications   buPROPion 150 MG 24 hr tablet Commonly known as:  WELLBUTRIN XL Take 1 tablet (150 mg total) by mouth daily.   cetirizine 10 MG  tablet Commonly known as:  ZYRTEC Take 10 mg by mouth every morning.   clobetasol ointment 0.05 % Commonly known as:  TEMOVATE Apply 1 application topically 2 (two) times daily as needed (lichen).   diphenhydrAMINE 25 MG tablet Commonly known as:  BENADRYL Take 25 mg by mouth at bedtime as needed for sleep.   prenatal multivitamin Tabs tablet Take 1 tablet by mouth daily.   PRILOSEC PO Take by mouth.   sertraline 100 MG tablet Commonly known as:  ZOLOFT Take 50 mg by mouth every morning.   sodium chloride 0.65 % Soln nasal spray Commonly known as:  OCEAN Place 1 spray into both nostrils as needed for congestion.      [place] Follow up on 11/03/2017.   Why:  BP check Contact information: [address] [place] [address] [phone]           Total time spent taking care of this patient: 30 minutes  Signed: Prentice Docker, MD  10/31/2017, 9:30 PM

## 2017-10-31 NOTE — OB Triage Note (Signed)
Pt arrived from home with complaints of increased lower extremity swelling this week and elevated BPs at home. Pt reports occasionally seeing spots in her vision but denies headache or epigastric pain. Pt reports positive fetal movement and denies any CTX, leaking fluid, or vaginal bleeding.

## 2017-10-31 NOTE — Discharge Summary (Signed)
See final progress note. 

## 2017-10-31 NOTE — Discharge Instructions (Signed)
Follow up in the office on Tuesday 2/12 for follow up with BP monitoring.

## 2017-10-31 NOTE — Telephone Encounter (Signed)
Pt states she is 35 weeks and experiencing swelling. Her B/P has been 139/108 & 153/110. She is wanting to know if she needs to go to L&D to be observed. Please advise

## 2017-11-03 ENCOUNTER — Ambulatory Visit (INDEPENDENT_AMBULATORY_CARE_PROVIDER_SITE_OTHER): Payer: Managed Care, Other (non HMO) | Admitting: Obstetrics and Gynecology

## 2017-11-03 VITALS — BP 130/92 | HR 86 | Ht 66.0 in | Wt 254.0 lb

## 2017-11-03 DIAGNOSIS — Z3A35 35 weeks gestation of pregnancy: Secondary | ICD-10-CM

## 2017-11-03 DIAGNOSIS — O09529 Supervision of elderly multigravida, unspecified trimester: Secondary | ICD-10-CM

## 2017-11-03 DIAGNOSIS — R030 Elevated blood-pressure reading, without diagnosis of hypertension: Secondary | ICD-10-CM

## 2017-11-03 NOTE — Progress Notes (Signed)
Routine Prenatal Care Visit  Subjective  Melinda Cortez is a 37 y.o. G3P1011 at [redacted]w[redacted]d being seen today for ongoing prenatal care.  She is currently monitored for the following issues for this high-risk pregnancy and has Anxiety and depression; Allergic rhinitis; Elevated BP without diagnosis of hypertension; Migraine; Encounter for supervision of high risk multigravida of advanced maternal age, antepartum; Obesity affecting pregnancy; BMI 38.0-38.9,adult; Elevated blood pressure affecting pregnancy in third trimester, antepartum; URI (upper respiratory infection); and Labor and delivery, indication for care on their problem list.  ----------------------------------------------------------------------------------- Patient reports no complaints.   Contractions: Not present. Vag. Bleeding: None.  Movement: Present. Denies leaking of fluid.  ----------------------------------------------------------------------------------- The following portions of the patient's history were reviewed and updated as appropriate: allergies, current medications, past family history, past medical history, past social history, past surgical history and problem list. Problem list updated.   Objective  Blood pressure (!) 130/92, pulse 86, height 5\' 6"  (1.676 m), weight 254 lb (115.2 kg), last menstrual period 02/10/2017, unknown if currently breastfeeding. Pregravid weight 238 lb (108 kg) Total Weight Gain 16 lb (7.258 kg) Urinalysis: Urine Protein: 1+ Urine Glucose: Negative  Fetal Status: Fetal Heart Rate (bpm): 140 Fundal Height: 38 cm Movement: Present     General:  Alert, oriented and cooperative. Patient is in no acute distress.  Skin: Skin is warm and dry. No rash noted.   Cardiovascular: Normal heart rate noted  Respiratory: Normal respiratory effort, no problems with respiration noted  Abdomen: Soft, gravid, appropriate for gestational age. Pain/Pressure: Absent     Pelvic:  Cervical exam deferred         Extremities: Normal range of motion.     ental Status: Normal mood and affect. Normal behavior. Normal judgment and thought content.     Assessment   37 y.o. G3P1011 at [redacted]w[redacted]d by  12/06/2017, by Ultrasound presenting for work-in prenatal visit  Plan   pregnancy #3 Problems (from 04/02/17 to present)    Problem Noted Resolved   Obesity affecting pregnancy 09/09/2017 by Will Bonnet, MD No   BMI 38.0-38.9,adult 09/09/2017 by Will Bonnet, MD No   Encounter for supervision of high risk multigravida of advanced maternal age, antepartum 04/06/2017 by Dalia Heading, [place] No   Overview Addendum 10/23/2017 12:05 PM by Malachy Mood, MD     Clinic [facility] Prenatal Labs  Dating 6 week [place] Blood type: O/Positive/-- (07/11 0949)   Genetic Screen NIPS: normal XY Antibody:Negative (07/11 0949)  Anatomic [place] Complete 07/15/2017 Rubella: 5.03 (07/11 0949)/ Varicella immune  GTT Early: 85 Third trimester: 131 RPR: Non Reactive (07/11 0949)   Flu vaccine 07/15/17 HBsAg: Negative (07/11 0949)   TDaP vaccine 10/07/17 Rhogam: N/A HIV: Non Reactive (07/11 0949)   Baby Food                                               GBS: (For PCN allergy, check sensitivities)  Contraception  Pap:NIL/neg 11/27/2016  Circumcision  GC/Chlamydia neg/neg  Pediatrician    Support Person             Elevated BP without diagnosis of hypertension 01/14/2017 by Leone Haven, MD No   Anxiety and depression 01/30/2016 by Leone Haven, MD No   Nausea and vomiting during pregnancy 05/29/2017 by Will Bonnet, MD 08/12/2017 by Malachy Mood, MD  Overview Signed 05/29/2017  5:49 PM by Will Bonnet, MD    - failed Diclegis - taking zofran, but is now @ 12 weeks, not working as well - add vit b6 for now. Other options discussed (phenergan, reglan)      Pregnancy 04/15/2017 by Leone Haven, MD 08/12/2017 by Malachy Mood, MD       Gestational age appropriate obstetric precautions  including but not limited to vaginal bleeding, contractions, leaking of fluid and fetal movement were reviewed in detail with the patient.    Blood pressures today in office 130s/90s. Home machine 140s/110s. Could not alibrate the home machine. Will have patient perform 24hr urine. Discussed warning signs or preeclampsia Patient to return this week for growth [place]. Patient can bring 24 hr urine to this visit.  Patient will need GBS testing at next visit.   Return if symptoms worsen or fail to improve, for as planned for [place] on thrusday.Adrian Prows MD [facility] 11/03/2017, 8:08 PM

## 2017-11-04 LAB — COMPREHENSIVE METABOLIC PANEL
ALBUMIN: 3.5 g/dL (ref 3.5–5.5)
ALT: 17 IU/L (ref 0–32)
AST: 25 IU/L (ref 0–40)
Albumin/Globulin Ratio: 1.5 (ref 1.2–2.2)
Alkaline Phosphatase: 122 IU/L — ABNORMAL HIGH (ref 39–117)
BUN / CREAT RATIO: 15 (ref 9–23)
BUN: 8 mg/dL (ref 6–20)
Bilirubin Total: 0.2 mg/dL (ref 0.0–1.2)
CALCIUM: 9.3 mg/dL (ref 8.7–10.2)
CO2: 18 mmol/L — AB (ref 20–29)
CREATININE: 0.54 mg/dL — AB (ref 0.57–1.00)
Chloride: 102 mmol/L (ref 96–106)
GFR calc Af Amer: 140 mL/min/{1.73_m2} (ref >59)
GFR calc non Af Amer: 122 mL/min/{1.73_m2} (ref >59)
GLOBULIN, TOTAL: 2.3 g/dL (ref 1.5–4.5)
Glucose: 96 mg/dL (ref 65–99)
Potassium: 3.9 mmol/L (ref 3.5–5.2)
SODIUM: 137 mmol/L (ref 134–144)
Total Protein: 5.8 g/dL — ABNORMAL LOW (ref 6.0–8.5)

## 2017-11-06 ENCOUNTER — Ambulatory Visit (INDEPENDENT_AMBULATORY_CARE_PROVIDER_SITE_OTHER): Payer: Managed Care, Other (non HMO)

## 2017-11-06 ENCOUNTER — Ambulatory Visit (INDEPENDENT_AMBULATORY_CARE_PROVIDER_SITE_OTHER): Payer: Managed Care, Other (non HMO) | Admitting: Obstetrics and Gynecology

## 2017-11-06 VITALS — BP 136/102 | Wt 255.0 lb

## 2017-11-06 DIAGNOSIS — O163 Unspecified maternal hypertension, third trimester: Secondary | ICD-10-CM

## 2017-11-06 DIAGNOSIS — O99213 Obesity complicating pregnancy, third trimester: Secondary | ICD-10-CM

## 2017-11-06 DIAGNOSIS — O1400 Mild to moderate pre-eclampsia, unspecified trimester: Secondary | ICD-10-CM

## 2017-11-06 DIAGNOSIS — Z3A33 33 weeks gestation of pregnancy: Secondary | ICD-10-CM | POA: Diagnosis not present

## 2017-11-06 DIAGNOSIS — Z6838 Body mass index (BMI) 38.0-38.9, adult: Secondary | ICD-10-CM

## 2017-11-06 DIAGNOSIS — O09529 Supervision of elderly multigravida, unspecified trimester: Secondary | ICD-10-CM

## 2017-11-06 LAB — CREATININE CLEARANCE, URINE, 24 HOUR
CREAT CLEAR: 172 mL/min — AB (ref 88–128)
CREATININE 24H UR: 1340 mg/(24.h) (ref 800–1800)
CREATININE: 0.54 mg/dL — AB (ref 0.57–1.00)
Creatinine, Urine: 223.3 mg/dL
GFR calc Af Amer: 140 mL/min/{1.73_m2} (ref >59)
GFR calc non Af Amer: 122 mL/min/{1.73_m2} (ref >59)

## 2017-11-06 LAB — PROTEIN, URINE, 24 HOUR
PROTEIN 24H UR: 148 mg/(24.h) (ref 30–150)
Protein, Ur: 24.6 mg/dL

## 2017-11-06 NOTE — Progress Notes (Signed)
Called with results, perhaps gestational HTN not preeclampsia.

## 2017-11-06 NOTE — Progress Notes (Signed)
ROB U/S/NST today Headache     Routine Prenatal Care Visit  Subjective  Melinda Cortez is a 37 y.o. G3P1011 at [redacted]w[redacted]d being seen today for ongoing prenatal care.  She is currently monitored for the following issues for this high-risk pregnancy and has Anxiety and depression; Allergic rhinitis; Elevated BP without diagnosis of hypertension; Migraine; Encounter for supervision of high risk multigravida of advanced maternal age, antepartum; Obesity affecting pregnancy; BMI 38.0-38.9,adult; Elevated blood pressure affecting pregnancy in third trimester, antepartum; URI (upper respiratory infection); and Labor and delivery, indication for care on their problem list.  ----------------------------------------------------------------------------------- Patient reports headache.  24 hour urine complete but pending. Patient reports that she has a cold and that the headache might be related to that.  Denies vision changes. Denies RUQ pain.  Contractions: Not present. Vag. Bleeding: None.  Movement: Present. Denies leaking of fluid.  ----------------------------------------------------------------------------------- The following portions of the patient's history were reviewed and updated as appropriate: allergies, current medications, past family history, past medical history, past social history, past surgical history and problem list. Problem list updated.   Objective  Blood pressure (!) 136/102, weight 255 lb (115.7 kg), last menstrual period 02/10/2017, unknown if currently breastfeeding. Pregravid weight 238 lb (108 kg) Total Weight Gain 17 lb (7.711 kg) Urinalysis: Urine Protein: Trace Urine Glucose: Negative  Fetal Status: Fetal Heart Rate (bpm): 140 Fundal Height: 38 cm Movement: Present     General:  Alert, oriented and cooperative. Patient is in no acute distress.  Skin: Skin is warm and dry. No rash noted.   Cardiovascular: Normal heart rate noted  Respiratory: Normal respiratory  effort, no problems with respiration noted  Abdomen: Soft, gravid, appropriate for gestational age. Pain/Pressure: Absent     Pelvic:  Cervical exam performed Dilation: 1 Effacement (%): 20 Station: -3  Extremities: Normal range of motion.     ental Status: Normal mood and affect. Normal behavior. Normal judgment and thought content.     Assessment   37 y.o. G3P1011 at [redacted]w[redacted]d by  12/06/2017, by Ultrasound presenting for routine prenatal visit  Plan   pregnancy #3 Problems (from 04/02/17 to present)    Problem Noted Resolved   Obesity affecting pregnancy 09/09/2017 by Will Bonnet, MD No   BMI 38.0-38.9,adult 09/09/2017 by Will Bonnet, MD No   Encounter for supervision of high risk multigravida of advanced maternal age, antepartum 04/06/2017 by Dalia Heading, CNM No   Overview Addendum 11/06/2017  7:32 PM by Homero Fellers, MD     Clinic [facility] Prenatal Labs  Dating 6 week [place] Blood type: O/Positive/-- (07/11 0949)   Genetic Screen NIPS: normal XY Antibody:Negative (07/11 0949)  Anatomic [place] Complete 07/15/2017 Rubella: 5.03 (07/11 0949)/ Varicella immune  GTT Early: 85 Third trimester: 131 RPR: Non Reactive (07/11 0949)   Flu vaccine 07/15/17 HBsAg: Negative (07/11 0949)   TDaP vaccine 10/07/17 Rhogam: N/A HIV: Non Reactive (07/11 0949)   Baby Food  Bottle                                             GBS: (For PCN allergy, check sensitivities)  Contraception  Given information Pap:NIL/neg 11/27/2016  Circumcision  GC/Chlamydia neg/neg  Pediatrician    Support Person             Elevated BP without diagnosis of hypertension 01/14/2017 by Leone Haven, MD No  Anxiety and depression 01/30/2016 by Leone Haven, MD No   Nausea and vomiting during pregnancy 05/29/2017 by Will Bonnet, MD 08/12/2017 by Malachy Mood, MD   Overview Signed 05/29/2017  5:49 PM by Will Bonnet, MD    - failed Diclegis - taking zofran, but is now @ 12 weeks, not  working as well - add vit b6 for now. Other options discussed (phenergan, reglan)      Pregnancy 04/15/2017 by Leone Haven, MD 08/12/2017 by Malachy Mood, MD       Gestational age appropriate obstetric precautions including but not limited to vaginal bleeding, contractions, leaking of fluid and fetal movement were reviewed in detail with the patient.    GBS gonorrhea and chlamydia testing today Headache and elevated BP. Patient likely has mild preeclampsia, 24 hour urine pending.  Preeclampsia warning signs discussed. Patient has home BP monitor but it seems to read her BP about 10 higher for systolic. Asked her to take BP 4 times a day and report if she has elevated BP more than 160/110.  IF headache does not resolve will consider delivery at 37 weeks for preeclampsia without severe features. Return in 4 days (on 11/10/2017) for ROB NST.  Adrian Prows MD [facility] 11/06/2017, 7:37 PM

## 2017-11-10 ENCOUNTER — Encounter: Payer: Self-pay | Admitting: Obstetrics and Gynecology

## 2017-11-10 ENCOUNTER — Ambulatory Visit (INDEPENDENT_AMBULATORY_CARE_PROVIDER_SITE_OTHER): Payer: Managed Care, Other (non HMO) | Admitting: Obstetrics and Gynecology

## 2017-11-10 VITALS — BP 148/96 | Wt 252.0 lb

## 2017-11-10 DIAGNOSIS — F329 Major depressive disorder, single episode, unspecified: Secondary | ICD-10-CM

## 2017-11-10 DIAGNOSIS — O133 Gestational [pregnancy-induced] hypertension without significant proteinuria, third trimester: Secondary | ICD-10-CM | POA: Diagnosis not present

## 2017-11-10 DIAGNOSIS — F419 Anxiety disorder, unspecified: Secondary | ICD-10-CM

## 2017-11-10 DIAGNOSIS — Z3A36 36 weeks gestation of pregnancy: Secondary | ICD-10-CM | POA: Diagnosis not present

## 2017-11-10 DIAGNOSIS — O09529 Supervision of elderly multigravida, unspecified trimester: Secondary | ICD-10-CM | POA: Diagnosis not present

## 2017-11-10 DIAGNOSIS — O99213 Obesity complicating pregnancy, third trimester: Secondary | ICD-10-CM

## 2017-11-10 DIAGNOSIS — O139 Gestational [pregnancy-induced] hypertension without significant proteinuria, unspecified trimester: Secondary | ICD-10-CM | POA: Insufficient documentation

## 2017-11-10 LAB — FETAL NONSTRESS TEST

## 2017-11-10 LAB — CULTURE, BETA STREP (GROUP B ONLY): STREP GP B CULTURE: NEGATIVE

## 2017-11-10 LAB — GC/CHLAMYDIA PROBE AMP
CHLAMYDIA, DNA PROBE: NEGATIVE
Neisseria gonorrhoeae by PCR: NEGATIVE

## 2017-11-10 NOTE — Progress Notes (Signed)
Released to mychart

## 2017-11-10 NOTE — Progress Notes (Signed)
Routine Prenatal Care Visit  Subjective  Melinda Cortez is a 37 y.o. G3P1011 at [redacted]w[redacted]d being seen today for ongoing prenatal care.  She is currently monitored for the following issues for this high-risk pregnancy and has Anxiety and depression; Allergic rhinitis; Elevated BP without diagnosis of hypertension; Migraine; Encounter for supervision of high risk multigravida of advanced maternal age, antepartum; Obesity affecting pregnancy; BMI 38.0-38.9,adult; Elevated blood pressure affecting pregnancy in third trimester, antepartum; URI (upper respiratory infection); Labor and delivery, indication for care; and Gestational hypertension without significant proteinuria on their problem list.  ----------------------------------------------------------------------------------- Patient reports mild headache over maxillary sinus area. She states they are tender to touch. Has been taking mucinex D (with pseudoephedrine). Denies visual changes and RUQ pain.headache improves with medication therapy. Contractions: Not present. Vag. Bleeding: None.  Movement: Present. Denies leaking of fluid.  ----------------------------------------------------------------------------------- The following portions of the patient's history were reviewed and updated as appropriate: allergies, current medications, past family history, past medical history, past social history, past surgical history and problem list. Problem list updated.   Objective  Blood pressure (!) 148/96, weight 252 lb (114.3 kg), last menstrual period 02/10/2017, unknown if currently breastfeeding. Pregravid weight 238 lb (108 kg) Total Weight Gain 14 lb (6.35 kg) Urinalysis:      Fetal Status: Fetal Heart Rate (bpm): 150   Movement: Present     General:  Alert, oriented and cooperative. Patient is in no acute distress.  Skin: Skin is warm and dry. No rash noted.   Cardiovascular: Normal heart rate noted  Respiratory: Normal respiratory effort, no  problems with respiration noted  Abdomen: Soft, gravid, appropriate for gestational age. Pain/Pressure: Absent     Pelvic:  Cervical exam deferred        Extremities: Normal range of motion.     Mental Status: Normal mood and affect. Normal behavior. Normal judgment and thought content.   NST Baseline FHR: 150 beats/min Variability: moderate Accelerations: present Decelerations: absent Tocometry: not performed  Interpretation:  INDICATIONS: gestational hypertension (mild) RESULTS:  A NST procedure was performed with FHR monitoring and a normal baseline established, appropriate time of 20-40 minutes of evaluation, and accels >2 seen w 15x15 characteristics.  Results show a REACTIVE NST.    Assessment   37 y.o. G3P1011 at [redacted]w[redacted]d by  12/06/2017, by Ultrasound presenting for routine prenatal visit  Plan   pregnancy #3 Problems (from 04/02/17 to present)    Problem Noted Resolved   Gestational hypertension without significant proteinuria 11/10/2017 by Will Bonnet, MD No   Overview Signed 11/10/2017  7:57 PM by Will Bonnet, MD    Twice weekly NST [x]  IOL at 37 weeks = scheduled      Obesity affecting pregnancy 09/09/2017 by Will Bonnet, MD No   BMI 38.0-38.9,adult 09/09/2017 by Will Bonnet, MD No   Encounter for supervision of high risk multigravida of advanced maternal age, antepartum 04/06/2017 by Dalia Heading, CNM No   Overview Addendum 11/06/2017  7:32 PM by Homero Fellers, MD     Clinic [facility] Prenatal Labs  Dating 6 week [place] Blood type: O/Positive/-- (07/11 0949)   Genetic Screen NIPS: normal XY Antibody:Negative (07/11 0949)  Anatomic [place] Complete 07/15/2017 Rubella: 5.03 (07/11 0949)/ Varicella immune  GTT Early: 85 Third trimester: 131 RPR: Non Reactive (07/11 0949)   Flu vaccine 07/15/17 HBsAg: Negative (07/11 0949)   TDaP vaccine 10/07/17 Rhogam: N/A HIV: Non Reactive (07/11 0949)   Baby Food  Bottle  GBS: (For PCN allergy, check sensitivities)  Contraception  Given information Pap:NIL/neg 11/27/2016  Circumcision  GC/Chlamydia neg/neg  Pediatrician    Support Person         Anxiety and depression 01/30/2016 by Leone Haven, MD No   Nausea and vomiting during pregnancy 05/29/2017 by Will Bonnet, MD 08/12/2017 by Malachy Mood, MD   Overview Signed 05/29/2017  5:49 PM by Will Bonnet, MD    - failed Diclegis - taking zofran, but is now @ 12 weeks, not working as well - add vit b6 for now. Other options discussed (phenergan, reglan)         Preterm labor symptoms and general obstetric precautions including but not limited to vaginal bleeding, contractions, leaking of fluid and fetal movement were reviewed in detail with the patient. Please refer to After Visit Summary for other counseling recommendations.   Return in 3 days (on 11/13/2017) for Routine prenatal with NST.  Prentice Docker, MD  11/10/2017 7:55 PM

## 2017-11-13 ENCOUNTER — Telehealth: Payer: Self-pay

## 2017-11-13 ENCOUNTER — Ambulatory Visit (INDEPENDENT_AMBULATORY_CARE_PROVIDER_SITE_OTHER): Payer: Managed Care, Other (non HMO) | Admitting: Obstetrics & Gynecology

## 2017-11-13 VITALS — BP 140/90 | Wt 256.0 lb

## 2017-11-13 DIAGNOSIS — O133 Gestational [pregnancy-induced] hypertension without significant proteinuria, third trimester: Secondary | ICD-10-CM

## 2017-11-13 DIAGNOSIS — O09529 Supervision of elderly multigravida, unspecified trimester: Secondary | ICD-10-CM

## 2017-11-13 DIAGNOSIS — O99213 Obesity complicating pregnancy, third trimester: Secondary | ICD-10-CM

## 2017-11-13 DIAGNOSIS — Z3A36 36 weeks gestation of pregnancy: Secondary | ICD-10-CM | POA: Diagnosis not present

## 2017-11-13 NOTE — Telephone Encounter (Signed)
FYI: Patients mother called and states the patient will be induced on Saturday due to elevated blood pressure.

## 2017-11-13 NOTE — Patient Instructions (Signed)
Labor Induction Labor induction is when steps are taken to cause a pregnant woman to begin the labor process. Most women go into labor on their own between 37 weeks and 42 weeks of the pregnancy. When this does not happen or when there is a medical need, methods may be used to induce labor. Labor induction causes a pregnant woman's uterus to contract. It also causes the cervix to soften (ripen), open (dilate), and thin out (efface). Usually, labor is not induced before 39 weeks of the pregnancy unless there is a problem with the baby or mother. Before inducing labor, your health care provider will consider a number of factors, including the following:  The medical condition of you and the baby.  How many weeks along you are.  The status of the baby's lung maturity.  The condition of the cervix.  The position of the baby. What are the reasons for labor induction? Labor may be induced for the following reasons:  The health of the baby or mother is at risk.  The pregnancy is overdue by 1 week or more.  The water breaks but labor does not start on its own.  The mother has a health condition or serious illness, such as high blood pressure, infection, placental abruption, or diabetes.  The amniotic fluid amounts are low around the baby.  The baby is distressed. Convenience or wanting the baby to be born on a certain date is not a reason for inducing labor. What methods are used for labor induction? Several methods of labor induction may be used, such as:  Prostaglandin medicine. This medicine causes the cervix to dilate and ripen. The medicine will also start contractions. It can be taken by mouth or by inserting a suppository into the vagina.  Inserting a thin tube (catheter) with a balloon on the end into the vagina to dilate the cervix. Once inserted, the balloon is expanded with water, which causes the cervix to open.  Stripping the membranes. Your health care provider separates  amniotic sac tissue from the cervix, causing the cervix to be stretched and causing the release of a hormone called progesterone. This may cause the uterus to contract. It is often done during an office visit. You will be sent home to wait for the contractions to begin. You will then come in for an induction.  Breaking the water. Your health care provider makes a hole in the amniotic sac using a small instrument. Once the amniotic sac breaks, contractions should begin. This may still take hours to see an effect.  Medicine to trigger or strengthen contractions. This medicine is given through an IV access tube inserted into a vein in your arm. All of the methods of induction, besides stripping the membranes, will be done in the hospital. Induction is done in the hospital so that you and the baby can be carefully monitored. How long does it take for labor to be induced? Some inductions can take up to 2-3 days. Depending on the cervix, it usually takes less time. It takes longer when you are induced early in the pregnancy or if this is your first pregnancy. If a mother is still pregnant and the induction has been going on for 2-3 days, either the mother will be sent home or a cesarean delivery will be needed. What are the risks associated with labor induction? Some of the risks of induction include:  Changes in fetal heart rate, such as too high, too low, or erratic.  Fetal distress.    Chance of infection for the mother and baby.  Increased chance of having a cesarean delivery.  Breaking off (abruption) of the placenta from the uterus (rare).  Uterine rupture (very rare). When induction is needed for medical reasons, the benefits of induction may outweigh the risks. What are some reasons for not inducing labor? Labor induction should not be done if:  It is shown that your baby does not tolerate labor.  You have had previous surgeries on your uterus, such as a myomectomy or the removal of  fibroids.  Your placenta lies very low in the uterus and blocks the opening of the cervix (placenta previa).  Your baby is not in a head-down position.  The umbilical cord drops down into the birth canal in front of the baby. This could cut off the baby's blood and oxygen supply.  You have had a previous cesarean delivery.  There are unusual circumstances, such as the baby being extremely premature. This information is not intended to replace advice given to you by your health care provider. Make sure you discuss any questions you have with your health care provider. Document Released: 01/29/2007 Document Revised: 02/15/2016 Document Reviewed: 04/08/2013 Elsevier Interactive Patient Education  2017 [employer]  

## 2017-11-13 NOTE — Progress Notes (Signed)
  Subjective  Fetal Movement? yes Contractions? no Leaking Fluid? no Vaginal Bleeding? No Denies h/a, blurry vision, CP, SOB, edema, epigastic pain.  Objective  BP 140/90   Wt 256 lb (116.1 kg)   LMP 02/10/2017 (Exact Date)   BMI 41.32 kg/m  General: NAD Pumonary: no increased work of breathing Abdomen: gravid, non-tender Extremities: no edema Psychiatric: mood appropriate, affect full  Assessment  37 y.o. G3P1011 at [redacted]w[redacted]d by  12/06/2017, by Ultrasound presenting for routine prenatal visit  Plan   Problem List Items Addressed This Visit      Cardiovascular and Mediastinum   Gestational hypertension without significant proteinuria     Other   Encounter for supervision of high risk multigravida of advanced maternal age, antepartum   Obesity affecting pregnancy    Other Visit Diagnoses    [redacted] weeks gestation of pregnancy    -  Primary    Plans IOL 66 weeks, sch w Dr Glennon Mac Pros and cons of IOL discussed Bottle feeding plans.  Unsure BC, considering Nexplanon.  No estorgen. GBS neg.  TDaP UTD.  Barnett Applebaum, MD, [facility], [facility] 11/13/2017  4:02 PM

## 2017-11-13 NOTE — Telephone Encounter (Signed)
Noted  

## 2017-11-15 ENCOUNTER — Other Ambulatory Visit: Payer: Self-pay

## 2017-11-15 ENCOUNTER — Inpatient Hospital Stay
Admission: EM | Admit: 2017-11-15 | Discharge: 2017-11-18 | DRG: 807 | Disposition: A | Discharge status: Home / Self Care | Payer: Managed Care, Other (non HMO) | Attending: Obstetrics and Gynecology | Admitting: Obstetrics and Gynecology

## 2017-11-15 DIAGNOSIS — Z87891 Personal history of nicotine dependence: Secondary | ICD-10-CM

## 2017-11-15 DIAGNOSIS — O139 Gestational [pregnancy-induced] hypertension without significant proteinuria, unspecified trimester: Secondary | ICD-10-CM

## 2017-11-15 DIAGNOSIS — O99210 Obesity complicating pregnancy, unspecified trimester: Secondary | ICD-10-CM | POA: Diagnosis present

## 2017-11-15 DIAGNOSIS — E669 Obesity, unspecified: Secondary | ICD-10-CM | POA: Diagnosis present

## 2017-11-15 DIAGNOSIS — O99214 Obesity complicating childbirth: Secondary | ICD-10-CM | POA: Diagnosis present

## 2017-11-15 DIAGNOSIS — Z3A37 37 weeks gestation of pregnancy: Secondary | ICD-10-CM | POA: Diagnosis not present

## 2017-11-15 DIAGNOSIS — Z6838 Body mass index (BMI) 38.0-38.9, adult: Secondary | ICD-10-CM

## 2017-11-15 DIAGNOSIS — F329 Major depressive disorder, single episode, unspecified: Secondary | ICD-10-CM | POA: Diagnosis present

## 2017-11-15 DIAGNOSIS — O134 Gestational [pregnancy-induced] hypertension without significant proteinuria, complicating childbirth: Secondary | ICD-10-CM | POA: Diagnosis present

## 2017-11-15 DIAGNOSIS — F32A Depression, unspecified: Secondary | ICD-10-CM | POA: Diagnosis present

## 2017-11-15 DIAGNOSIS — O99344 Other mental disorders complicating childbirth: Secondary | ICD-10-CM | POA: Diagnosis present

## 2017-11-15 DIAGNOSIS — F419 Anxiety disorder, unspecified: Secondary | ICD-10-CM | POA: Diagnosis present

## 2017-11-15 DIAGNOSIS — R030 Elevated blood-pressure reading, without diagnosis of hypertension: Secondary | ICD-10-CM

## 2017-11-15 DIAGNOSIS — O09529 Supervision of elderly multigravida, unspecified trimester: Secondary | ICD-10-CM

## 2017-11-15 DIAGNOSIS — O99213 Obesity complicating pregnancy, third trimester: Secondary | ICD-10-CM

## 2017-11-15 DIAGNOSIS — O133 Gestational [pregnancy-induced] hypertension without significant proteinuria, third trimester: Secondary | ICD-10-CM

## 2017-11-15 HISTORY — DX: Gestational (pregnancy-induced) hypertension without significant proteinuria, unspecified trimester: O13.9

## 2017-11-15 LAB — CBC
HEMATOCRIT: 36.2 % (ref 35.0–47.0)
Hemoglobin: 12.4 g/dL (ref 12.0–16.0)
MCH: 31.1 pg (ref 26.0–34.0)
MCHC: 34.2 g/dL (ref 32.0–36.0)
MCV: 90.9 fL (ref 80.0–100.0)
Platelets: 205 10*3/uL (ref 150–440)
RBC: 3.99 MIL/uL (ref 3.80–5.20)
RDW: 13.9 % (ref 11.5–14.5)
WBC: 8.2 10*3/uL (ref 3.6–11.0)

## 2017-11-15 LAB — COMPREHENSIVE METABOLIC PANEL
ALT: 23 U/L (ref 14–54)
ANION GAP: 10 (ref 5–15)
AST: 38 U/L (ref 15–41)
Albumin: 2.8 g/dL — ABNORMAL LOW (ref 3.5–5.0)
Alkaline Phosphatase: 124 U/L (ref 38–126)
BILIRUBIN TOTAL: 0.8 mg/dL (ref 0.3–1.2)
BUN: 11 mg/dL (ref 6–20)
CO2: 18 mmol/L — ABNORMAL LOW (ref 22–32)
Calcium: 9 mg/dL (ref 8.9–10.3)
Chloride: 108 mmol/L (ref 101–111)
Creatinine, Ser: 0.65 mg/dL (ref 0.44–1.00)
Glucose, Bld: 88 mg/dL (ref 65–99)
POTASSIUM: 4.4 mmol/L (ref 3.5–5.1)
Sodium: 136 mmol/L (ref 135–145)
TOTAL PROTEIN: 6.3 g/dL — AB (ref 6.5–8.1)

## 2017-11-15 LAB — TYPE AND SCREEN
ABO/RH(D): O POS
ANTIBODY SCREEN: NEGATIVE

## 2017-11-15 LAB — PROTEIN / CREATININE RATIO, URINE
CREATININE, URINE: 298 mg/dL
Protein Creatinine Ratio: 0.11 mg/mg{Cre} (ref 0.00–0.15)
Total Protein, Urine: 32 mg/dL

## 2017-11-15 MED ORDER — LACTATED RINGERS IV SOLN: 500.0000 mL | INTRAVENOUS | Status: DC | PRN

## 2017-11-15 MED ORDER — OXYTOCIN BOLUS FROM INFUSION
500.0000 mL | Freq: Once | INTRAVENOUS | Status: AC
  Administered 2017-11-16: 500 mL via INTRAVENOUS

## 2017-11-15 MED ORDER — ONDANSETRON HCL 4 MG/2ML IJ SOLN: 4.0000 mg | Freq: Four times a day (QID) | INTRAMUSCULAR | Status: DC | PRN

## 2017-11-15 MED ORDER — LIDOCAINE HCL (PF) 1 % IJ SOLN
INTRAMUSCULAR | Status: AC
  Filled 2017-11-15: qty 30

## 2017-11-15 MED ORDER — TERBUTALINE SULFATE 1 MG/ML IJ SOLN: 0.2500 mg | Freq: Once | INTRAMUSCULAR | Status: DC | PRN

## 2017-11-15 MED ORDER — MISOPROSTOL 25 MCG QUARTER TABLET
ORAL_TABLET | ORAL | Status: AC
  Administered 2017-11-15: 25 ug
  Filled 2017-11-15: qty 1

## 2017-11-15 MED ORDER — SODIUM CHLORIDE 0.9 % IJ SOLN
INTRAMUSCULAR | Status: AC
  Administered 2017-11-15: 30 mL
  Filled 2017-11-15: qty 50

## 2017-11-15 MED ORDER — AMMONIA AROMATIC IN INHA
RESPIRATORY_TRACT | Status: AC
  Filled 2017-11-15: qty 10

## 2017-11-15 MED ORDER — LIDOCAINE HCL (PF) 1 % IJ SOLN: 30.0000 mL | INTRAMUSCULAR | Status: DC | PRN

## 2017-11-15 MED ORDER — MISOPROSTOL 25 MCG QUARTER TABLET
25.0000 ug | ORAL_TABLET | ORAL | Status: DC | PRN
  Administered 2017-11-15 (×2): 25 ug via VAGINAL
  Filled 2017-11-15 (×3): qty 1

## 2017-11-15 MED ORDER — OXYTOCIN 40 UNITS IN LACTATED RINGERS INFUSION - SIMPLE MED
2.5000 [IU]/h | INTRAVENOUS | Status: DC
  Administered 2017-11-16 (×2): 2.5 [IU]/h via INTRAVENOUS
  Filled 2017-11-15: qty 1000

## 2017-11-15 MED ORDER — MISOPROSTOL 200 MCG PO TABS
ORAL_TABLET | ORAL | Status: AC
  Filled 2017-11-15: qty 4

## 2017-11-15 MED ORDER — OXYTOCIN 10 UNIT/ML IJ SOLN
INTRAMUSCULAR | Status: AC
  Filled 2017-11-15: qty 2

## 2017-11-15 MED ORDER — OXYTOCIN 10 UNIT/ML IJ SOLN: 10.0000 [IU] | Freq: Once | INTRAMUSCULAR | Status: DC

## 2017-11-15 MED ORDER — SOD CITRATE-CITRIC ACID 500-334 MG/5ML PO SOLN: 30.0000 mL | ORAL | Status: DC | PRN

## 2017-11-15 MED ORDER — OXYTOCIN 40 UNITS IN LACTATED RINGERS INFUSION - SIMPLE MED
1.0000 m[IU]/min | INTRAVENOUS | Status: DC
  Administered 2017-11-15: 1 m[IU]/min via INTRAVENOUS

## 2017-11-15 MED ORDER — LACTATED RINGERS IV SOLN
INTRAVENOUS | Status: DC
  Administered 2017-11-15 – 2017-11-16 (×3): via INTRAVENOUS

## 2017-11-15 NOTE — H&P (Signed)
OB History & Physical   History of Present Illness:  Chief Complaint: here for induction of labor for gestational hypertension  HPI:  Melinda Cortez is a 37 y.o. G86P1011 female at [redacted]w[redacted]d dated by 6 week ultrasound.  Her pregnancy has been complicated by gestational hypertension, obesity (BMI above 40), anxiety/depression.    Melinda Cortez denies contractions.   Melinda Cortez denies leakage of fluid.   Melinda Cortez denies vaginal bleeding.   Melinda Cortez reports fetal movement.   Denies headaches, visual changes, and right upper quadrant pain.  Maternal Medical History:   Past Medical History:  Diagnosis Date  . Allergic rhinitis   . Anxiety   . Chronic headaches    HORMONAL   . Complication of anesthesia   . Depression   . Elevated blood pressure   . PONV (postoperative nausea and vomiting)     Past Surgical History:  Procedure Laterality Date  . DILATION AND CURETTAGE OF UTERUS    . DILATION AND EVACUATION N/A 01/07/2017   Procedure: DILATATION AND EVACUATION;  Surgeon: Malachy Mood, MD;  Location: [facility] ORS;  Service: Gynecology;  Laterality: N/A;  . TONSILLECTOMY AND ADENOIDECTOMY  1994   Allergies: No Known Allergies  Medications   Medication Sig Start Date End Date Taking? Authorizing Provider  buPROPion (WELLBUTRIN XL) 150 MG 24 hr tablet Take 1 tablet (150 mg total) by mouth daily. 10/24/17  Yes Leone Haven, MD  cetirizine (ZYRTEC) 10 MG tablet Take 10 mg by mouth every morning.    Yes [provider]  diphenhydrAMINE (BENADRYL) 25 MG tablet Take 25 mg by mouth at bedtime as needed for sleep.   Yes [provider]  Omeprazole (PRILOSEC PO) Take by mouth.   Yes [provider]  Prenatal Vit-Fe Fumarate-FA (PRENATAL MULTIVITAMIN) TABS tablet Take 1 tablet by mouth daily.    Yes [provider]  sertraline (ZOLOFT) 100 MG tablet Take 50 mg by mouth every morning.    Yes [provider]  sodium chloride (OCEAN) 0.65 % SOLN nasal spray Place 1 spray into  both nostrils as needed for congestion.   Yes [provider]  acetaminophen (TYLENOL) 500 MG tablet Take 500 mg by mouth every 6 (six) hours as needed.    [provider]  clobetasol ointment (TEMOVATE) 0.92 % Apply 1 application topically 2 (two) times daily as needed (lichen).     [provider]    OB History  Gravida Para Term Preterm AB Living  3 1 1   1 1   SAB TAB Ectopic Multiple Live Births  1       1    # Outcome Date GA Lbr Len/2nd Weight Sex Delivery Anes PTL Lv  3 Current           2 SAB 01/07/17 [redacted]w[redacted]d    SAB     1 Term 03/20/11 [redacted]w[redacted]d  7 lb 5 oz (3.317 kg) F Vag-Spont  N Melinda Cortez      Prenatal care site: [facility]  Social History: Melinda Cortez  reports that Melinda Cortez quit smoking about 2 years ago. Her smoking use included cigarettes. Melinda Cortez has a 4.00 pack-year smoking history. Melinda Cortez has never used smokeless tobacco. Melinda Cortez reports that Melinda Cortez drinks alcohol. Melinda Cortez reports that Melinda Cortez does not use drugs.  Family History: family history includes Alcoholism in her unknown relative; Breast cancer (age of onset: 31) in her maternal aunt; Heart disease in her father; Hypertension in her brother, brother, and father; Lung cancer in her unknown relative; Lymphoma  in her paternal grandfather; Schizophrenia in her maternal uncle.   Review of Systems:  Review of Systems  Constitutional: Negative.   HENT: Positive for congestion and sinus pain. Negative for ear discharge, ear pain, hearing loss, nosebleeds, sore throat and tinnitus.   Eyes: Negative for blurred vision, double vision and photophobia.  Respiratory: Negative.  Negative for stridor.   Cardiovascular: Negative.   Gastrointestinal: Negative.   Genitourinary: Negative.   Musculoskeletal: Negative.   Skin: Negative.   Neurological: Negative.   Psychiatric/Behavioral: Negative.      Physical Exam:  Vital Signs: BP (!) 148/94   Pulse 72   Temp 98.2 F (36.8 C) (Oral)   LMP 02/10/2017 (Exact Date)  Constitutional:  Well nourished, well developed female in no acute distress.  HEENT: normal Skin: Warm and dry.  Cardiovascular: Regular rate and rhythm.   Extremity: no edema  Respiratory: Clear to auscultation bilateral. Normal respiratory effort Abdomen: FHT present and gravid/NT Back: no CVAT Neuro: DTRs 2+, Cranial nerves grossly intact Psych: Alert and Oriented x3. No memory deficits. Normal mood and affect.  MS: normal gait, normal bilateral lower extremity ROM/strength/stability.  Pelvic exam: (female chaperone present) EGBUS: within normal limits Vagina: within normal limits and with normal mucosa blood in the vault Cervix: 1/20/-3/medium consistency/posterior  SSE performed and foley catheter placed through cervix using ring forcep without difficulty.  Foley balloon instilled with 30 mL sterile saline. Misoprostol 25 mcg placed through speculum and pushed to the posterior fornix without difficulty.  Female chaperone present for pelvic exam:   Pertinent Results:  Prenatal Labs: Blood type/Rh O positive  Antibody screen negative  Rubella Immune  Varicella Immune    RPR NR  HBsAg negative  HIV negative  GC negative  Chlamydia negative  Genetic screening Diploid XY NIPT testint, no msAFP  1 hour GTT eawrly hgb a1c 5.0%, 28 weeks 131  3 hour GTT n/a  GBS negative on 11/06/2017   Baseline FHR: 135 beats/min   Variability: moderate   Accelerations: present   Decelerations: absent Contractions: present frequency: 2 q 10 min Overall assessment: category 1  Bedside Ultrasound:  Number of Fetus: 1  Presentation: cephalic  Assessment:  Melinda Cortez is a 37 y.o. G76P1011 female at [redacted]w[redacted]d with IOL for gestational hypertension.   Plan:  1. Admit to Labor & Delivery  2. CBC, T&S, Clrs, IVF 3. GBS negative.   4. Fetwal well-being: reassuring, category 1 5. IOL with misoprostol 25 mcg and foley catheter balloon.  Counseled regarding risks/benefits of IOL for given indication. Discussed  risk of fetal intolerance with misoprostol, risk of c-section, if IOL not successful.    Prentice Docker, MD 11/15/2017 10:37 AM

## 2017-11-15 NOTE — Progress Notes (Signed)
Patient ID: Melinda Cortez, female   DOB: 1981/03/08, 37 y.o.   MRN: [id number]  Labor Check  Subj:  Complaints: none   Obj:  BP 133/80   Pulse 90   Temp 97.7 F (36.5 C) (Axillary)   Resp 20   Ht 5\' 6"  (1.676 m)   Wt 256 lb (116.1 kg)   LMP 02/10/2017 (Exact Date)   BMI 41.32 kg/m     Cervix: Dilation: 3 / Effacement (%): 40 / Station: -2  Baseline FHR: 135 beats/min   Variability: moderate   Accelerations: present   Decelerations: absent Contractions: present frequency: 3 q 10 min Overall assessment: cat 1  A/P: 7 y.o. G3P1011 female at [redacted]w[redacted]d with IOL for GHTN.  1.  Labor: continue cytotec per protocol  2.  FWB: reassuring, Overall assessment: category 1  3.  GBS negative  4.  Pain: prn 5.  Recheck: 4 hours. May start pitocin at that time.   Prentice Docker, MD 11/15/2017 6:36 PM

## 2017-11-16 ENCOUNTER — Inpatient Hospital Stay: Payer: Managed Care, Other (non HMO) | Admitting: Anesthesiology

## 2017-11-16 ENCOUNTER — Encounter: Payer: Self-pay | Admitting: Anesthesiology

## 2017-11-16 MED ORDER — PRENATAL MULTIVITAMIN CH
1.0000 | ORAL_TABLET | Freq: Every day | ORAL | Status: DC
  Administered 2017-11-17: 1 via ORAL
  Filled 2017-11-16: qty 1

## 2017-11-16 MED ORDER — BUPROPION HCL ER (XL) 150 MG PO TB24
150.0000 mg | ORAL_TABLET | Freq: Every day | ORAL | Status: DC
  Administered 2017-11-16 – 2017-11-17 (×2): 150 mg via ORAL
  Filled 2017-11-16 (×2): qty 1

## 2017-11-16 MED ORDER — ACETAMINOPHEN 325 MG PO TABS
650.0000 mg | ORAL_TABLET | ORAL | Status: DC | PRN
Start: 2017-11-16 — End: 2017-11-18

## 2017-11-16 MED ORDER — FENTANYL 2.5 MCG/ML W/ROPIVACAINE 0.15% IN NS 100 ML EPIDURAL (ARMC)
EPIDURAL | Status: DC | PRN
  Administered 2017-11-16: 12 mL/h via EPIDURAL

## 2017-11-16 MED ORDER — ONDANSETRON HCL 4 MG/2ML IJ SOLN: 4.0000 mg | INTRAMUSCULAR | Status: DC | PRN

## 2017-11-16 MED ORDER — COCONUT OIL OIL
1.0000 | TOPICAL_OIL | Status: DC | PRN
Start: 2017-11-16 — End: 2017-11-18

## 2017-11-16 MED ORDER — DIBUCAINE 1 % RE OINT: 1.0000 "application " | TOPICAL_OINTMENT | RECTAL | Status: DC | PRN

## 2017-11-16 MED ORDER — SERTRALINE HCL 25 MG PO TABS
50.0000 mg | ORAL_TABLET | Freq: Every day | ORAL | Status: DC
  Administered 2017-11-16 – 2017-11-17 (×2): 50 mg via ORAL
  Filled 2017-11-16 (×2): qty 2

## 2017-11-16 MED ORDER — LACTATED RINGERS IV SOLN: 500.0000 mL | Freq: Once | INTRAVENOUS | Status: DC

## 2017-11-16 MED ORDER — PHENYLEPHRINE 40 MCG/ML (10ML) SYRINGE FOR IV PUSH (FOR BLOOD PRESSURE SUPPORT)
80.0000 ug | PREFILLED_SYRINGE | INTRAVENOUS | Status: DC | PRN
  Filled 2017-11-16: qty 5

## 2017-11-16 MED ORDER — LORATADINE 10 MG PO TABS
10.0000 mg | ORAL_TABLET | Freq: Every day | ORAL | Status: DC
  Administered 2017-11-16 – 2017-11-17 (×2): 10 mg via ORAL
  Filled 2017-11-16 (×2): qty 1

## 2017-11-16 MED ORDER — IBUPROFEN 600 MG PO TABS
600.0000 mg | ORAL_TABLET | Freq: Four times a day (QID) | ORAL | Status: DC
  Administered 2017-11-16 – 2017-11-18 (×7): 600 mg via ORAL
  Filled 2017-11-16 (×7): qty 1

## 2017-11-16 MED ORDER — FENTANYL 2.5 MCG/ML W/ROPIVACAINE 0.15% IN NS 100 ML EPIDURAL (ARMC)
EPIDURAL | Status: AC
  Filled 2017-11-16: qty 100

## 2017-11-16 MED ORDER — FERROUS SULFATE 325 (65 FE) MG PO TABS
325.0000 mg | ORAL_TABLET | Freq: Two times a day (BID) | ORAL | Status: DC
  Administered 2017-11-17 – 2017-11-18 (×3): 325 mg via ORAL
  Filled 2017-11-16 (×3): qty 1

## 2017-11-16 MED ORDER — DIPHENHYDRAMINE HCL 25 MG PO CAPS: 25.0000 mg | ORAL_CAPSULE | Freq: Four times a day (QID) | ORAL | Status: DC | PRN

## 2017-11-16 MED ORDER — BUPIVACAINE HCL (PF) 0.25 % IJ SOLN
INTRAMUSCULAR | Status: DC | PRN
  Administered 2017-11-16: 5 mL via EPIDURAL

## 2017-11-16 MED ORDER — DIPHENHYDRAMINE HCL 50 MG/ML IJ SOLN: 12.5000 mg | INTRAMUSCULAR | Status: DC | PRN

## 2017-11-16 MED ORDER — WITCH HAZEL-GLYCERIN EX PADS: 1.0000 "application " | MEDICATED_PAD | CUTANEOUS | Status: DC | PRN

## 2017-11-16 MED ORDER — EPHEDRINE 5 MG/ML INJ
10.0000 mg | INTRAVENOUS | Status: DC | PRN
  Filled 2017-11-16: qty 2

## 2017-11-16 MED ORDER — SERTRALINE HCL 25 MG PO TABS: 50.0000 mg | ORAL_TABLET | ORAL | Status: DC

## 2017-11-16 MED ORDER — SIMETHICONE 80 MG PO CHEW: 80.0000 mg | CHEWABLE_TABLET | ORAL | Status: DC | PRN

## 2017-11-16 MED ORDER — FENTANYL 2.5 MCG/ML W/ROPIVACAINE 0.15% IN NS 100 ML EPIDURAL (ARMC)
12.0000 mL/h | EPIDURAL | Status: DC
  Administered 2017-11-16: 12 mL/h via EPIDURAL
  Filled 2017-11-16 (×2): qty 100

## 2017-11-16 MED ORDER — HYDROCODONE-ACETAMINOPHEN 5-325 MG PO TABS
1.0000 | ORAL_TABLET | Freq: Four times a day (QID) | ORAL | Status: DC | PRN
  Administered 2017-11-17 – 2017-11-18 (×4): 1 via ORAL
  Filled 2017-11-16 (×4): qty 1

## 2017-11-16 MED ORDER — ONDANSETRON HCL 4 MG PO TABS: 4.0000 mg | ORAL_TABLET | ORAL | Status: DC | PRN

## 2017-11-16 MED ORDER — BENZOCAINE-MENTHOL 20-0.5 % EX AERO
1.0000 "application " | INHALATION_SPRAY | CUTANEOUS | Status: DC | PRN
  Filled 2017-11-16: qty 56

## 2017-11-16 MED ORDER — SENNOSIDES-DOCUSATE SODIUM 8.6-50 MG PO TABS
2.0000 | ORAL_TABLET | ORAL | Status: DC
  Administered 2017-11-17: 2 via ORAL
  Filled 2017-11-16: qty 2

## 2017-11-16 NOTE — Anesthesia Preprocedure Evaluation (Signed)
Anesthesia Evaluation  Patient identified by MRN, date of birth, ID band Patient awake    Reviewed: Allergy & Precautions, NPO status , Patient's Chart, lab work & pertinent test results, reviewed documented beta blocker date and time   History of Anesthesia Complications (+) PONV and history of anesthetic complications  Airway Mallampati: III  TM Distance: >3 FB     Dental  (+) Chipped   Pulmonary former smoker,           Cardiovascular hypertension,      Neuro/Psych  Headaches, PSYCHIATRIC DISORDERS Anxiety Depression    GI/Hepatic   Endo/Other    Renal/GU      Musculoskeletal   Abdominal   Peds  Hematology   Anesthesia Other Findings Obese.  Reproductive/Obstetrics                             Anesthesia Physical Anesthesia Plan  ASA: III  Anesthesia Plan: Epidural   Post-op Pain Management:    Induction:   PONV Risk Score and Plan:   Airway Management Planned:   Additional Equipment:   Intra-op Plan:   Post-operative Plan:   Informed Consent: I have reviewed the patients History and Physical, chart, labs and discussed the procedure including the risks, benefits and alternatives for the proposed anesthesia with the patient or authorized representative who has indicated his/her understanding and acceptance.     Plan Discussed with: CRNA  Anesthesia Plan Comments:         Anesthesia Quick Evaluation

## 2017-11-16 NOTE — Progress Notes (Signed)
Patient ID: Melinda Cortez, female   DOB: Jun 15, 1981, 37 y.o.   MRN: [id number]  Labor Check  Subj:  Complaints: comfortable with epidural   Obj:  BP 118/68   Pulse 95   Temp 98.8 F (37.1 C) (Oral)   Resp 18   Ht 5\' 6"  (1.676 m)   Wt 256 lb (116.1 kg)   LMP 02/10/2017 (Exact Date)   SpO2 98%   BMI 41.32 kg/m  Dose (milli-units/min) Oxytocin: 18 milli-units/min  Cervix: Dilation: 5 / Effacement (%): 50 / Station: -3, Ballotable   SROM: ?meconium stained fluid Baseline FHR: 140 beats/min   Variability: moderate   Accelerations: present   Decelerations: absent Contractions: present frequency: 4-5 q 10 min Overall assessment: cat 1  A/P: 59 y.o. G3P1011 female at [redacted]w[redacted]d with IOL for preeclampsia.  1.  Labor: now status post SROM while I was present. Fluid possibly meconium stained. Will continue to monitor.  Continue pitocin per protocol.    2.  FWB: reassuring, Overall assessment: category 1  3.  GBS neg  4.  Pain: epidural 5.  Recheck: 2 hour or prn   Prentice Docker, MD 11/16/2017 1:34 PM

## 2017-11-16 NOTE — Anesthesia Procedure Notes (Signed)
Epidural Patient location during procedure: OB  Staffing Anesthesiologist: Deiona Hooper, MD Performed: anesthesiologist   Preanesthetic Checklist Completed: patient identified, site marked, surgical consent, pre-op evaluation, timeout performed, IV checked, risks and benefits discussed and monitors and equipment checked  Epidural Patient position: sitting Prep: ChloraPrep Patient monitoring: heart rate, continuous pulse ox and blood pressure Approach: midline Location: L4-L5 Injection technique: LOR saline  Needle:  Needle type: Tuohy  Needle gauge: 18 G Needle length: 9 cm and 9 Catheter type: closed end flexible Catheter size: 20 Guage Test dose: negative and 1.5% lidocaine with Epi 1:200 K  Assessment Sensory level: T10 Events: blood not aspirated, injection not painful, no injection resistance, negative IV test and no paresthesia  Additional Notes   Patient tolerated the insertion well without complications.Reason for block:procedure for pain     

## 2017-11-16 NOTE — Anesthesia Postprocedure Evaluation (Signed)
Anesthesia Post Note  Patient: Melinda Cortez  Procedure(s) Performed: AN AD [place]  Patient location during evaluation: Mother Baby Anesthesia Type: Epidural Level of consciousness: awake and alert Pain management: pain level controlled Vital Signs Assessment: post-procedure vital signs reviewed and stable Respiratory status: spontaneous breathing, nonlabored ventilation and respiratory function stable Cardiovascular status: stable Postop Assessment: no headache, no backache and epidural receding Anesthetic complications: no     Last Vitals:  Vitals:   11/16/17 1730 11/16/17 1831  BP: (!) 147/83 (!) 159/91  Pulse: 93 100  Resp:  17  Temp:  36.7 C  SpO2:  98%    Last Pain:  Vitals:   11/16/17 1835  TempSrc:   PainSc: [place]

## 2017-11-16 NOTE — Discharge Summary (Signed)
OB Discharge Summary     Patient Name: Melinda Cortez DOB: [birth year] MRN: [id number]  Date of admission: 11/15/2017 Delivering MD: Prentice Docker, MD  Date of Delivery: 11/16/2017  Date of discharge: 11/18/2017  Admitting diagnosis: Induction Intrauterine pregnancy: [redacted]w[redacted]d     Secondary diagnosis: Gestational Hypertension     Discharge diagnosis: Term Pregnancy Delivered and Gestational Hypertension                                                                                                Post partum procedures:none  Augmentation: AROM, Pitocin, Cytotec and Foley Balloon  Complications: None  Hospital course:  Induction of Labor With Vaginal Delivery   37 y.o. yo [id number] at [redacted]w[redacted]d was admitted to the hospital 11/15/2017 for induction of labor.  Indication for induction: Gestational hypertension.  Patient had an uncomplicated labor course as follows: Membrane Rupture Time/Date: 1:30 PM ,11/16/2017   Intrapartum Procedures: Episiotomy: None [1]                                         Lacerations:  2nd degree [3]  Patient had delivery of a Viable infant.  Information for the patient's newborn:  Zitlali, Primm [[id number]]  Delivery Method: Vag-Spont   11/16/2017  Details of delivery can be found in separate delivery note.  Patient had a routine postpartum course. Patient is discharged home 11/18/17.  Physical exam  Vitals:   11/17/17 2100 11/18/17 0037 11/18/17 0539 11/18/17 0814  BP: 128/81 137/82 133/75 139/82  Pulse: 84 97 77 68  Resp: 18 20 20 18   Temp: 98.3 F (36.8 C) 98.1 F (36.7 C) 97.7 F (36.5 C) 97.7 F (36.5 C)  TempSrc: Oral Oral Oral Oral  SpO2: 98% 99% 100% 100%  Weight:      Height:       General: alert and no distress Lochia: appropriate Uterine Fundus: firm Incision: N/A DVT Evaluation: No evidence of DVT seen on physical exam.  Labs: Lab Results  Component Value Date   WBC 11.2 (H) 11/17/2017   HGB 12.8 11/17/2017   HCT 38.9  11/17/2017   MCV 92.5 11/17/2017   PLT 139 (L) 11/17/2017    Discharge instruction: per After Visit Summary.  Medications:  Allergies as of 11/18/2017   No Known Allergies     Medication List    TAKE these medications   acetaminophen 500 MG tablet Commonly known as:  TYLENOL Take 500 mg by mouth every 6 (six) hours as needed.   buPROPion 150 MG 24 hr tablet Commonly known as:  WELLBUTRIN XL Take 1 tablet (150 mg total) by mouth daily.   cetirizine 10 MG tablet Commonly known as:  ZYRTEC Take 10 mg by mouth every morning.   clobetasol ointment 0.05 % Commonly known as:  TEMOVATE Apply 1 application topically 2 (two) times daily as needed (lichen).   diphenhydrAMINE 25 MG tablet Commonly known as:  BENADRYL Take 25 mg by mouth at bedtime as needed for sleep.   ibuprofen  600 MG tablet Commonly known as:  ADVIL,MOTRIN Take 1 tablet (600 mg total) by mouth every 6 (six) hours as needed for mild pain, moderate pain or cramping.   prenatal multivitamin Tabs tablet Take 1 tablet by mouth daily.   PRILOSEC PO Take by mouth.   sertraline 100 MG tablet Commonly known as:  ZOLOFT Take 50 mg by mouth every morning.   sodium chloride 0.65 % Soln nasal spray Commonly known as:  OCEAN Place 1 spray into both nostrils as needed for congestion.       Diet: routine diet  Activity: Advance as tolerated. Pelvic rest for 6 weeks.   Outpatient follow up: Follow-up Information    Will Bonnet, MD Follow up in 1 week(s).   Specialty:  Obstetrics and Gynecology Why:  postpartum depression check and BP check Contact information: [address] [place] [address] [phone]             Postpartum contraception: Undecided Rhogam Given postpartum: no Rubella vaccine given postpartum: no Varicella vaccine given postpartum: no TDaP given antepartum or postpartum: given on 10/07/2017 Influenza vaccine: Given on 07/15/2017  Newborn Data: Live born female   Birth Weight:   APGAR: 39, 9  Newborn Delivery   Birth date/time:  11/16/2017 15:23:00 Delivery type:  Vaginal, Spontaneous      Baby Feeding: Bottle  Disposition:home with mother  SIGNED:  Malachy Mood, MD, [place], [facility] 11/18/2017, 8:32 AM

## 2017-11-17 LAB — CBC
HEMATOCRIT: 38.9 % (ref 35.0–47.0)
Hemoglobin: 12.8 g/dL (ref 12.0–16.0)
MCH: 30.4 pg (ref 26.0–34.0)
MCHC: 32.9 g/dL (ref 32.0–36.0)
MCV: 92.5 fL (ref 80.0–100.0)
PLATELETS: 139 10*3/uL — AB (ref 150–440)
RBC: 4.21 MIL/uL (ref 3.80–5.20)
RDW: 14.1 % (ref 11.5–14.5)
WBC: 11.2 10*3/uL — AB (ref 3.6–11.0)

## 2017-11-17 NOTE — Progress Notes (Signed)
Post Partum Day 1 Subjective: Doing well, no complaints.  Tolerating regular diet, pain with PO meds, voiding and ambulating without difficulty.  No CP SOB F/C N/V or leg pain No HA, change of vision, RUQ/epigastric pain  Objective: BP 140/86 (BP Location: Left Arm)   Pulse 89   Temp 98.1 F (36.7 C) (Oral)   Resp 18   Ht 5\' 6"  (1.676 m)   Wt 256 lb (116.1 kg)   LMP 02/10/2017 (Exact Date)   SpO2 98%   BMI 41.32 kg/m    Physical Exam:  General: NAD CV: RRR Pulm: nl effort, CTABL Lochia: moderate Uterine Fundus: fundus firm and below umbilicus DVT Evaluation: no cords, ttp LEs   Recent Labs    11/15/17 0837 11/17/17 0650  HGB 12.4 12.8  HCT 36.2 38.9  WBC 8.2 11.2*  PLT 205 139*    Assessment/Plan: 37 y.o. T2W5809 postpartum day # 1  1. Continue routine postpartum care 2. O positive, Rubella Immune, Varicella Immune 3. TDAP given antepartum 4. Formula feeding/Contraception: undecided 5. Disposition: discharge to home tomorrow   Rod Can, CNM

## 2017-11-18 ENCOUNTER — Telehealth: Payer: Self-pay

## 2017-11-18 MED ORDER — IBUPROFEN 600 MG PO TABS: 600.0000 mg | ORAL_TABLET | Freq: Four times a day (QID) | ORAL | 0 refills | Status: AC | PRN

## 2017-11-18 NOTE — Progress Notes (Signed)
Patient discharged home with infant. Discharge instructions and prescriptions given and reviewed with patient. Patient verbalized understanding. Escorted out by auxillary.  

## 2017-11-18 NOTE — Telephone Encounter (Signed)
FMLA/DISABILITY form for [employer] filled out, signature obtained, and given to [place] for processing.

## 2017-11-19 ENCOUNTER — Telehealth: Payer: Self-pay | Admitting: Obstetrics & Gynecology

## 2017-11-19 LAB — RPR: RPR Ser Ql: NONREACTIVE

## 2017-11-19 NOTE — Telephone Encounter (Signed)
Lm for patient letting her know that her FMLA paperwork has been faxed through to [employer].

## 2017-11-24 ENCOUNTER — Encounter: Payer: Self-pay | Admitting: Obstetrics and Gynecology

## 2017-11-24 ENCOUNTER — Ambulatory Visit (INDEPENDENT_AMBULATORY_CARE_PROVIDER_SITE_OTHER): Payer: Managed Care, Other (non HMO) | Admitting: Obstetrics and Gynecology

## 2017-11-24 VITALS — BP 158/98 | Ht 66.0 in | Wt 242.0 lb

## 2017-11-24 DIAGNOSIS — F329 Major depressive disorder, single episode, unspecified: Secondary | ICD-10-CM | POA: Diagnosis not present

## 2017-11-24 DIAGNOSIS — O139 Gestational [pregnancy-induced] hypertension without significant proteinuria, unspecified trimester: Secondary | ICD-10-CM

## 2017-11-24 DIAGNOSIS — F32A Depression, unspecified: Secondary | ICD-10-CM

## 2017-11-24 DIAGNOSIS — F419 Anxiety disorder, unspecified: Secondary | ICD-10-CM

## 2017-11-24 MED ORDER — LABETALOL HCL 100 MG PO TABS: 100.0000 mg | ORAL_TABLET | Freq: Two times a day (BID) | ORAL | 1 refills | Status: DC

## 2017-11-24 NOTE — Progress Notes (Signed)
Obstetrics & Gynecology Office Visit   Chief Complaint  Patient presents with  . Follow-up   History of Present Illness: 37 y.o. C6C3762 female approximately 1 week postpartum from an induced SVD for gestational hypertension. She also has a history of anxiety and depression.  She notes some mild anxiety has occurred on occasion. Denies HA, visual changes, and RUQ pain. Not taking BPs at home. Taking Zoloft 50 mg daily.  EPDS today = 12 (0 on #10)  Past Medical History:  Diagnosis Date  . Allergic rhinitis   . Anxiety   . Chronic headaches    HORMONAL   . Complication of anesthesia   . Depression   . Elevated blood pressure   . PONV (postoperative nausea and vomiting)     Past Surgical History:  Procedure Laterality Date  . DILATION AND CURETTAGE OF UTERUS    . DILATION AND EVACUATION N/A 01/07/2017   Procedure: DILATATION AND EVACUATION;  Surgeon: Malachy Mood, MD;  Location: [facility] ORS;  Service: Gynecology;  Laterality: N/A;  . [place]    Gynecologic History: No LMP recorded.  Obstetric History: G3T5176  Family History  Problem Relation Age of Onset  . Alcoholism Unknown   . Breast cancer Maternal Aunt 55       Mothers aunt  . Lung cancer Unknown   . Hypertension Father   . Heart disease Father   . Hypertension Brother   . Hypertension Brother        prediabetes  . Lymphoma Paternal Grandfather   . Schizophrenia Maternal Uncle     Social History   Socioeconomic History  . Marital status: Married    Spouse name: Not on file  . Number of children: 1  . Years of education: Not on file  . Highest education level: Not on file  Social Needs  . Financial resource strain: Not on file  . Food insecurity - worry: Not on file  . Food insecurity - inability: Not on file  . Transportation needs - medical: Not on file  . Transportation needs - non-medical: Not on file  Occupational History  . Not on file  Tobacco Use  . Smoking  status: Former Smoker    Packs/day: 0.25    Years: 16.00    Pack years: 4.00    Types: Cigarettes    Last attempt to quit: 01/07/2015    Years since quitting: 2.8  . Smokeless tobacco: Never Used  Substance and Sexual Activity  . Alcohol use: Yes    Alcohol/week: 0.0 oz    Comment: rare  . Drug use: No  . Sexual activity: Yes    Birth control/protection: None  Other Topics Concern  . Not on file  Social History Narrative  . Not on file   Allergies: No Known Allergies  Medications   Medication Sig Start Date End Date Taking? Authorizing Provider  acetaminophen (TYLENOL) 500 MG tablet Take 500 mg by mouth every 6 (six) hours as needed.    [provider]  buPROPion (WELLBUTRIN XL) 150 MG 24 hr tablet Take 1 tablet (150 mg total) by mouth daily. 10/24/17   Leone Haven, MD  cetirizine (ZYRTEC) 10 MG tablet Take 10 mg by mouth every morning.     [provider]  clobetasol ointment (TEMOVATE) 1.60 % Apply 1 application topically 2 (two) times daily as needed (lichen).     [provider]  diphenhydrAMINE (BENADRYL) 25 MG tablet Take 25 mg by mouth at  bedtime as needed for sleep.    [provider]  ibuprofen (ADVIL,MOTRIN) 600 MG tablet Take 1 tablet (600 mg total) by mouth every 6 (six) hours as needed for mild pain, moderate pain or cramping. 11/18/17   Malachy Mood, MD  Omeprazole (PRILOSEC PO) Take by mouth.    [provider]  Prenatal Vit-Fe Fumarate-FA (PRENATAL MULTIVITAMIN) TABS tablet Take 1 tablet by mouth daily.     [provider]  sertraline (ZOLOFT) 100 MG tablet Take 50 mg by mouth every morning.     [provider]  sodium chloride (OCEAN) 0.65 % SOLN nasal spray Place 1 spray into both nostrils as needed for congestion.    [provider]    Review of Systems  Constitutional: Positive for malaise/fatigue. Negative for chills, diaphoresis, fever and weight loss.  HENT: Negative.   Eyes:  Negative for blurred vision, double vision, photophobia, pain, discharge and redness.  Respiratory: Positive for cough. Negative for hemoptysis, sputum production and shortness of breath.   Cardiovascular: Negative for chest pain, palpitations, orthopnea, claudication, leg swelling and PND.  Gastrointestinal: Negative.  Negative for abdominal pain, heartburn and nausea.  Genitourinary: Negative.   Musculoskeletal: Negative.   Skin: Negative.   Neurological: Negative for dizziness, tingling, tremors, sensory change, speech change, focal weakness, seizures, loss of consciousness, weakness and headaches.  Endo/Heme/Allergies: Negative.   Psychiatric/Behavioral: Positive for depression. Negative for hallucinations, memory loss, substance abuse and suicidal ideas. The patient is nervous/anxious. The patient does not have insomnia.     Physical Exam BP (!) 158/98   Ht 5\' 6"  (1.676 m)   Wt 242 lb (109.8 kg)   BMI 39.06 kg/m  No LMP recorded.  Repeat BP  154/96 Physical Exam  Constitutional: She is oriented to person, place, and time. She appears well-developed and well-nourished. No distress.  HENT:  Head: Normocephalic and atraumatic.  Eyes: Conjunctivae are normal. No scleral icterus.  Neck: Normal range of motion. Neck supple.  Cardiovascular: Normal rate and regular rhythm.  Pulmonary/Chest: Effort normal and breath sounds normal. No respiratory distress.  Abdominal: Soft. Bowel sounds are normal. She exhibits no distension. There is no tenderness. There is no guarding.  Musculoskeletal: Normal range of motion. She exhibits no edema or deformity.  Neurological: She is alert and oriented to person, place, and time. No cranial nerve deficit.  Skin: Skin is warm and dry. No erythema.  Psychiatric: She has a normal mood and affect. Her behavior is normal. Judgment normal.    Assessment: 37 y.o. U5K2706 female here for  1. Gestational hypertension, antepartum   2. Anxiety and depression        Plan: Problem List Items Addressed This Visit      Cardiovascular and Mediastinum   Gestational hypertension - Primary   Relevant Medications   labetalol (NORMODYNE) 100 MG tablet     Other   Anxiety and depression    Anxiety/Depression: increase zoloft to 100 mg per day.  Discussed postpartum blues. However, she has high risk of developing postpartum depression. Discussed SI/HI. She has none of these symptoms today.  She is able to contract for safety. Will follow her closely.  Gestational hypertension: BPs running high, but not in severe range and she has no symptoms. Will start a low-dose of labetalol and recheck soon (~1 week). Precautions for headache, visual changes, and RUQ pain.  She is to return to clinic or call ASAP should any of these symptoms develop.   Prentice Docker, MD 11/24/2017  6:07 PM

## 2017-12-01 ENCOUNTER — Ambulatory Visit (INDEPENDENT_AMBULATORY_CARE_PROVIDER_SITE_OTHER): Payer: Managed Care, Other (non HMO) | Admitting: Obstetrics and Gynecology

## 2017-12-01 ENCOUNTER — Encounter: Payer: Self-pay | Admitting: Obstetrics and Gynecology

## 2017-12-01 VITALS — BP 148/100 | HR 76 | Ht 66.0 in | Wt 232.0 lb

## 2017-12-01 DIAGNOSIS — F419 Anxiety disorder, unspecified: Secondary | ICD-10-CM

## 2017-12-01 DIAGNOSIS — F329 Major depressive disorder, single episode, unspecified: Secondary | ICD-10-CM

## 2017-12-01 DIAGNOSIS — O139 Gestational [pregnancy-induced] hypertension without significant proteinuria, unspecified trimester: Secondary | ICD-10-CM

## 2017-12-01 NOTE — Progress Notes (Signed)
Obstetrics & Gynecology Office Visit   Chief Complaint  Patient presents with  . Follow-up    BP check and Depression     History of Present Illness: 37 y.o. N2T5573 female who is 2 weeks postpartum from SVD after induction for gestational hypertension.  She was seen about 1 week ago and BPs were 158/98 and 154/96.  She also had an EPDS score of 12.   She was started on labetalol at that time.  She has had an occasional headache, relieved with Tylenol.  Much improved anxiety (score EPDS 2 today, 12 last week).  No visual changes, no ruq pain.   Past Medical History:  Diagnosis Date  . Allergic rhinitis   . Anxiety   . Chronic headaches    HORMONAL   . Complication of anesthesia   . Depression   . Elevated blood pressure   . PONV (postoperative nausea and vomiting)     Past Surgical History:  Procedure Laterality Date  . DILATION AND CURETTAGE OF UTERUS    . DILATION AND EVACUATION N/A 01/07/2017   Procedure: DILATATION AND EVACUATION;  Surgeon: Malachy Mood, MD;  Location: [facility] ORS;  Service: Gynecology;  Laterality: N/A;  . [place]    Gynecologic History: No LMP recorded.  Obstetric History: U2G2542  Family History  Problem Relation Age of Onset  . Alcoholism Unknown   . Breast cancer Maternal Aunt 28       Mothers aunt  . Lung cancer Unknown   . Hypertension Father   . Heart disease Father   . Hypertension Brother   . Hypertension Brother        prediabetes  . Lymphoma Paternal Grandfather   . Schizophrenia Maternal Uncle     Social History   Socioeconomic History  . Marital status: Married    Spouse name: Not on file  . Number of children: 1  . Years of education: Not on file  . Highest education level: Not on file  Social Needs  . Financial resource strain: Not on file  . Food insecurity - worry: Not on file  . Food insecurity - inability: Not on file  . Transportation needs - medical: Not on file  . Transportation  needs - non-medical: Not on file  Occupational History  . Not on file  Tobacco Use  . Smoking status: Former Smoker    Packs/day: 0.25    Years: 16.00    Pack years: 4.00    Types: Cigarettes    Last attempt to quit: 01/07/2015    Years since quitting: 2.9  . Smokeless tobacco: Never Used  Substance and Sexual Activity  . Alcohol use: Yes    Alcohol/week: 0.0 oz    Comment: rare  . Drug use: No  . Sexual activity: Not Currently    Birth control/protection: None  Other Topics Concern  . Not on file  Social History Narrative  . Not on file    No Known Allergies  Prior to Admission medications   Medication Sig Start Date End Date Taking? Authorizing Provider  acetaminophen (TYLENOL) 500 MG tablet Take 500 mg by mouth every 6 (six) hours as needed.   Yes [provider]  buPROPion (WELLBUTRIN XL) 150 MG 24 hr tablet Take 1 tablet (150 mg total) by mouth daily. 10/24/17  Yes Leone Haven, MD  cetirizine (ZYRTEC) 10 MG tablet Take 10 mg by mouth every morning.    Yes [provider]  clobetasol ointment (TEMOVATE)  5.44 % Apply 1 application topically 2 (two) times daily as needed (lichen).    Yes [provider]  diphenhydrAMINE (BENADRYL) 25 MG tablet Take 25 mg by mouth at bedtime as needed for sleep.   Yes [provider]  ibuprofen (ADVIL,MOTRIN) 600 MG tablet Take 1 tablet (600 mg total) by mouth every 6 (six) hours as needed for mild pain, moderate pain or cramping. 11/18/17  Yes Malachy Mood, MD  labetalol (NORMODYNE) 100 MG tablet Take 1 tablet (100 mg total) by mouth 2 (two) times daily. 11/24/17  Yes Will Bonnet, MD  Prenatal Vit-Fe Fumarate-FA (PRENATAL MULTIVITAMIN) TABS tablet Take 1 tablet by mouth daily.    Yes [provider]  sertraline (ZOLOFT) 100 MG tablet Take 50 mg by mouth every morning.    Yes [provider]  sodium chloride (OCEAN) 0.65 % SOLN nasal spray Place 1 spray into both nostrils as  needed for congestion.   Yes [provider]  Omeprazole (PRILOSEC PO) Take by mouth.    [provider]    Review of Systems  Constitutional: Negative.   HENT: Negative.   Eyes: Negative.   Respiratory: Negative.   Cardiovascular: Negative.   Gastrointestinal: Negative.   Genitourinary: Negative.   Musculoskeletal: Negative.   Neurological: Negative for dizziness, tingling, tremors, sensory change, focal weakness, seizures and headaches.  Psychiatric/Behavioral: Positive for depression (improved). The patient is nervous/anxious (improved).      Physical Exam BP (!) 148/100   Pulse 76   Ht 5\' 6"  (1.676 m)   Wt 232 lb (105.2 kg)   Breastfeeding? No   BMI 37.45 kg/m  No LMP recorded. Physical Exam  Constitutional: She is oriented to person, place, and time. She appears well-developed and well-nourished. No distress.  HENT:  Head: Normocephalic and atraumatic.  Eyes: Conjunctivae are normal. No scleral icterus.  Neck: Normal range of motion.  Cardiovascular: Normal rate and regular rhythm. Exam reveals no gallop and no friction rub.  No murmur heard. Pulmonary/Chest: Effort normal and breath sounds normal. No respiratory distress. She has no wheezes. She has no rales.  Musculoskeletal: Normal range of motion. She exhibits no edema.  Neurological: She is alert and oriented to person, place, and time. No cranial nerve deficit.  Psychiatric: She has a normal mood and affect. Her behavior is normal. Judgment normal.    Assessment: 37 y.o. B2E1007 female here for  1. Gestational hypertension, antepartum   2. Anxiety and depression      Plan: Problem List Items Addressed This Visit      Cardiovascular and Mediastinum   Gestational hypertension - Primary     Other   Anxiety and depression     Gestational hypertension: increase labetalol to 200 mg po bid. Same precautions as last week reviewed. Anxiety/depression: improved. Continued to  monitor.  Prentice Docker, MD 12/01/2017 10:58 AM

## 2017-12-08 ENCOUNTER — Encounter: Payer: Self-pay | Admitting: Obstetrics and Gynecology

## 2017-12-08 ENCOUNTER — Ambulatory Visit (INDEPENDENT_AMBULATORY_CARE_PROVIDER_SITE_OTHER): Payer: Managed Care, Other (non HMO) | Admitting: Obstetrics and Gynecology

## 2017-12-08 VITALS — BP 134/92 | Ht 66.0 in | Wt 228.0 lb

## 2017-12-08 DIAGNOSIS — F419 Anxiety disorder, unspecified: Secondary | ICD-10-CM

## 2017-12-08 DIAGNOSIS — F329 Major depressive disorder, single episode, unspecified: Secondary | ICD-10-CM

## 2017-12-08 DIAGNOSIS — O139 Gestational [pregnancy-induced] hypertension without significant proteinuria, unspecified trimester: Secondary | ICD-10-CM

## 2017-12-08 NOTE — Progress Notes (Signed)
Obstetrics & Gynecology Office Visit   Chief Complaint  Patient presents with  . Blood Pressure Check    History of Present Illness: 37 y.o. K1S0109 female who is 4 weeks postpartum from SVD after induction for gestational hypertension.  She was seen about 1 week ago and BPs were 148/100.  She also had an EPDS score of 12.   She was started on labetalol 3 weeks ago.  She has no symptoms of hypotension.  Much improved anxiety No visual changes, no ruq pain.   Past Medical History:  Diagnosis Date  . Allergic rhinitis   . Anxiety   . Chronic headaches    HORMONAL   . Complication of anesthesia   . Depression   . Elevated blood pressure   . PONV (postoperative nausea and vomiting)     Past Surgical History:  Procedure Laterality Date  . DILATION AND CURETTAGE OF UTERUS    . DILATION AND EVACUATION N/A 01/07/2017   Procedure: DILATATION AND EVACUATION;  Surgeon: Malachy Mood, MD;  Location: [facility] ORS;  Service: Gynecology;  Laterality: N/A;  . [place]    Gynecologic History: No LMP recorded.  Obstetric History: N2T5573  Family History  Problem Relation Age of Onset  . Alcoholism Unknown   . Breast cancer Maternal Aunt 75       Mothers aunt  . Lung cancer Unknown   . Hypertension Father   . Heart disease Father   . Hypertension Brother   . Hypertension Brother        prediabetes  . Lymphoma Paternal Grandfather   . Schizophrenia Maternal Uncle     Social History   Socioeconomic History  . Marital status: Married    Spouse name: Not on file  . Number of children: 1  . Years of education: Not on file  . Highest education level: Not on file  Social Needs  . Financial resource strain: Not on file  . Food insecurity - worry: Not on file  . Food insecurity - inability: Not on file  . Transportation needs - medical: Not on file  . Transportation needs - non-medical: Not on file  Occupational History  . Not on file  Tobacco Use    . Smoking status: Former Smoker    Packs/day: 0.25    Years: 16.00    Pack years: 4.00    Types: Cigarettes    Last attempt to quit: 01/07/2015    Years since quitting: 2.9  . Smokeless tobacco: Never Used  Substance and Sexual Activity  . Alcohol use: Yes    Alcohol/week: 0.0 oz    Comment: rare  . Drug use: No  . Sexual activity: Not Currently    Birth control/protection: None  Other Topics Concern  . Not on file  Social History Narrative  . Not on file    No Known Allergies  Prior to Admission medications   Medication Sig Start Date End Date Taking? Authorizing Provider  acetaminophen (TYLENOL) 500 MG tablet Take 500 mg by mouth every 6 (six) hours as needed.   Yes [provider]  buPROPion (WELLBUTRIN XL) 150 MG 24 hr tablet Take 1 tablet (150 mg total) by mouth daily. 10/24/17  Yes Leone Haven, MD  cetirizine (ZYRTEC) 10 MG tablet Take 10 mg by mouth every morning.    Yes [provider]  clobetasol ointment (TEMOVATE) 2.20 % Apply 1 application topically 2 (two) times daily as needed (lichen).    Yes [provider]  diphenhydrAMINE (BENADRYL) 25 MG tablet Take 25 mg by mouth at bedtime as needed for sleep.   Yes [provider]  ibuprofen (ADVIL,MOTRIN) 600 MG tablet Take 1 tablet (600 mg total) by mouth every 6 (six) hours as needed for mild pain, moderate pain or cramping. 11/18/17  Yes Malachy Mood, MD  labetalol (NORMODYNE) 100 MG tablet Take 1 tablet (100 mg total) by mouth 2 (two) times daily. 11/24/17  Yes Will Bonnet, MD  Prenatal Vit-Fe Fumarate-FA (PRENATAL MULTIVITAMIN) TABS tablet Take 1 tablet by mouth daily.    Yes [provider]  sertraline (ZOLOFT) 100 MG tablet Take 50 mg by mouth every morning.    Yes [provider]  sodium chloride (OCEAN) 0.65 % SOLN nasal spray Place 1 spray into both nostrils as needed for congestion.   Yes [provider]  Omeprazole (PRILOSEC PO) Take by  mouth.    [provider]    Review of Systems  Constitutional: Negative.   HENT: Negative.   Eyes: Negative.   Respiratory: Negative.   Cardiovascular: Negative.   Gastrointestinal: Negative.   Genitourinary: Negative.   Musculoskeletal: Negative.   Neurological: Negative for dizziness, tingling, tremors, sensory change, focal weakness, seizures and headaches.  Psychiatric/Behavioral: Positive for depression (improved). The patient is nervous/anxious (improved).      Physical Exam BP (!) 134/92   Ht 5\' 6"  (1.676 m)   Wt 228 lb (103.4 kg)   BMI 36.80 kg/m  No LMP recorded. Physical Exam  Constitutional: She is oriented to person, place, and time. She appears well-developed and well-nourished. No distress.  HENT:  Head: Normocephalic and atraumatic.  Eyes: Conjunctivae are normal. No scleral icterus.  Neck: Normal range of motion.  Cardiovascular: Normal rate and regular rhythm. Exam reveals no gallop and no friction rub.  No murmur heard. Pulmonary/Chest: Effort normal and breath sounds normal. No respiratory distress. She has no wheezes. She has no rales.  Musculoskeletal: Normal range of motion. She exhibits no edema.  Neurological: She is alert and oriented to person, place, and time. No cranial nerve deficit.  Psychiatric: She has a normal mood and affect. Her behavior is normal. Judgment normal.    Assessment: 37 y.o. Y3F3832 female here for  1. Gestational hypertension, antepartum   2. Anxiety and depression      Plan: Problem List Items Addressed This Visit      Cardiovascular and Mediastinum   Gestational hypertension - Primary     Other   Anxiety and depression     Gestational hypertension: decrease labetalol to 100 mg po bid. Same precautions as last week reviewed. Anxiety/depression: improved. Continued to monitor.  Follow up 2 weeks for 6 week postpartum  Prentice Docker, MD 12/08/2017 12:08 PM

## 2017-12-15 ENCOUNTER — Encounter: Payer: Self-pay | Admitting: Obstetrics and Gynecology

## 2017-12-16 ENCOUNTER — Other Ambulatory Visit: Payer: Self-pay | Admitting: Obstetrics and Gynecology

## 2017-12-16 DIAGNOSIS — O139 Gestational [pregnancy-induced] hypertension without significant proteinuria, unspecified trimester: Secondary | ICD-10-CM

## 2017-12-16 MED ORDER — LABETALOL HCL 100 MG PO TABS: 100.0000 mg | ORAL_TABLET | Freq: Two times a day (BID) | ORAL | 0 refills | Status: DC

## 2017-12-25 ENCOUNTER — Ambulatory Visit (INDEPENDENT_AMBULATORY_CARE_PROVIDER_SITE_OTHER): Payer: Managed Care, Other (non HMO) | Admitting: Obstetrics and Gynecology

## 2017-12-25 ENCOUNTER — Encounter: Payer: Self-pay | Admitting: Obstetrics and Gynecology

## 2017-12-25 DIAGNOSIS — O139 Gestational [pregnancy-induced] hypertension without significant proteinuria, unspecified trimester: Secondary | ICD-10-CM

## 2017-12-25 NOTE — Progress Notes (Signed)
Postpartum Visit  Chief Complaint: 6 week post-partum visit  History of Present Illness: Patient is a 37 y.o. [id number] presents for postpartum visit.  Date of delivery: 11/16/2017 Type of delivery: Vaginal delivery - Vacuum or forceps assisted no Episiotomy No.  Laceration: yes-2nd degree Pregnancy or labor problems:  Gestational hypertension Any problems since the delivery:  no  Newborn Details:  SINGLETON :  1. Baby's name: Winferd Humphrey. Birth weight: 6.9 Maternal Details:  Breast Feeding: no Post partum depression/anxiety noted: some anxiety since delivery, but Zoloft helping now [place] Post-Partum Depression Score:  1  Date of last PAP: 11/27/2016-HPV negative  Past Medical History:  Diagnosis Date  . Allergic rhinitis   . Anxiety   . Chronic headaches    HORMONAL   . Complication of anesthesia   . Depression   . Elevated blood pressure   . PONV (postoperative nausea and vomiting)     Past Surgical History:  Procedure Laterality Date  . DILATION AND CURETTAGE OF UTERUS    . DILATION AND EVACUATION N/A 01/07/2017   Procedure: DILATATION AND EVACUATION;  Surgeon: Malachy Mood, MD;  Location: [facility] ORS;  Service: Gynecology;  Laterality: N/A;  . TONSILLECTOMY AND ADENOIDECTOMY  1994    Prior to Admission medications   Medication Sig Start Date End Date Taking? Authorizing Provider  acetaminophen (TYLENOL) 500 MG tablet Take 500 mg by mouth every 6 (six) hours as needed.    [provider]  buPROPion (WELLBUTRIN XL) 150 MG 24 hr tablet Take 1 tablet (150 mg total) by mouth daily. 10/24/17   Leone Haven, MD  cetirizine (ZYRTEC) 10 MG tablet Take 10 mg by mouth every morning.     [provider]  clobetasol ointment (TEMOVATE) 7.61 % Apply 1 application topically 2 (two) times daily as needed (lichen).     [provider]  diphenhydrAMINE (BENADRYL) 25 MG tablet Take 25 mg by mouth at bedtime as needed for sleep.    [provider]  ibuprofen (ADVIL,MOTRIN) 600 MG tablet Take 1 tablet (600 mg total) by mouth every 6 (six) hours as needed for mild pain, moderate pain or cramping. 11/18/17   Malachy Mood, MD  labetalol (NORMODYNE) 100 MG tablet Take 1 tablet (100 mg total) by mouth 2 (two) times daily. 12/16/17   Will Bonnet, MD  Omeprazole (PRILOSEC PO) Take by mouth.    [provider]  Prenatal Vit-Fe Fumarate-FA (PRENATAL MULTIVITAMIN) TABS tablet Take 1 tablet by mouth daily.     [provider]  sertraline (ZOLOFT) 100 MG tablet Take 50 mg by mouth every morning.     [provider]  sodium chloride (OCEAN) 0.65 % SOLN nasal spray Place 1 spray into both nostrils as needed for congestion.    [provider]    No Known Allergies   Social History   Socioeconomic History  . Marital status: Married    Spouse name: Not on file  . Number of children: 1  . Years of education: Not on file  . Highest education level: Not on file  Occupational History  . Not on file  Social Needs  . Financial resource strain: Not on file  . Food insecurity:    Worry: Not on file    Inability: Not on file  . Transportation needs:    Medical: Not on file    Non-medical: Not on file  Tobacco Use  . Smoking status: Former Smoker    Packs/day: 0.25  Years: 16.00    Pack years: 4.00    Types: Cigarettes    Last attempt to quit: 01/07/2015    Years since quitting: 2.9  . Smokeless tobacco: Never Used  Substance and Sexual Activity  . Alcohol use: Yes    Alcohol/week: 0.0 oz    Comment: rare  . Drug use: No  . Sexual activity: Not Currently    Birth control/protection: None  Lifestyle  . Physical activity:    Days per week: Not on file    Minutes per session: Not on file  . Stress: Not on file  Relationships  . Social connections:    Talks on phone: Not on file    Gets together: Not on file    Attends religious service: Not on file    Active member of club or  organization: Not on file    Attends meetings of clubs or organizations: Not on file    Relationship status: Not on file  . Intimate partner violence:    Fear of current or ex partner: Not on file    Emotionally abused: Not on file    Physically abused: Not on file    Forced sexual activity: Not on file  Other Topics Concern  . Not on file  Social History Narrative  . Not on file    Family History  Problem Relation Age of Onset  . Alcoholism Unknown   . Breast cancer Maternal Aunt 80       Mothers aunt  . Lung cancer Unknown   . Hypertension Father   . Heart disease Father   . Hypertension Brother   . Hypertension Brother        prediabetes  . Lymphoma Paternal Grandfather   . Schizophrenia Maternal Uncle     Review of Systems  Constitutional: Negative.   HENT: Negative.   Eyes: Negative.   Respiratory: Negative.   Cardiovascular: Negative.   Gastrointestinal: Negative.   Genitourinary: Negative.   Musculoskeletal: Negative.   Skin: Negative.   Neurological: Negative.   Psychiatric/Behavioral: Negative.      Physical Exam BP 126/78   Ht 5\' 6"  (1.676 m)   Wt 226 lb (102.5 kg)   BMI 36.48 kg/m   Physical Exam  Constitutional: She is oriented to person, place, and time. She appears well-developed and well-nourished. No distress.  Genitourinary: Vagina normal and uterus normal. Pelvic exam was performed with patient supine. There is no rash, tenderness or lesion on the right labia. There is no rash, tenderness or lesion on the left labia. Vagina exhibits no lesion. No bleeding in the vagina. No signs of injury around the vagina. Right adnexum does not display mass, does not display tenderness and does not display fullness. Left adnexum does not display mass, does not display tenderness and does not display fullness. Cervix does not exhibit motion tenderness, lesion or polyp.   Uterus is mobile. Uterus is not enlarged, tender, exhibiting a mass or irregular (is  regular).  HENT:  Head: Normocephalic and atraumatic.  Eyes: EOM are normal. No scleral icterus.  Neck: Normal range of motion. Neck supple.  Cardiovascular: Normal rate and regular rhythm.  Pulmonary/Chest: Effort normal and breath sounds normal. No respiratory distress. She has no wheezes. She has no rales.  Abdominal: Soft. Bowel sounds are normal. She exhibits no distension and no mass. There is no tenderness. There is no rebound and no guarding.  Musculoskeletal: Normal range of motion. She exhibits no edema.  Neurological: She is alert  and oriented to person, place, and time. No cranial nerve deficit.  Skin: Skin is warm and dry. No erythema.  Psychiatric: She has a normal mood and affect. Her behavior is normal. Judgment normal.     Female Chaperone present during breast and/or pelvic exam.  Assessment: 37 y.o. [id number] presenting for 6 week postpartum visit  Plan: Problem List Items Addressed This Visit      Cardiovascular and Mediastinum   Gestational hypertension    Other Visit Diagnoses    Postpartum care and examination    -  Primary     1) Contraception: vasectomy.  2)  Pap - ASCCP guidelines and rational discussed.  Patient opts for routine screening interval  3) Patient underwent screening for postpartum depression with no concerns noted.  4) gestational hypertension: stop labetalol. Call if persistent blood pressures  5) Follow up 1 year for routine annual exam  Prentice Docker, MD 12/25/2017 8:37 AM

## 2018-01-06 ENCOUNTER — Ambulatory Visit (INDEPENDENT_AMBULATORY_CARE_PROVIDER_SITE_OTHER): Payer: Managed Care, Other (non HMO) | Admitting: Psychology

## 2018-01-06 DIAGNOSIS — F4323 Adjustment disorder with mixed anxiety and depressed mood: Secondary | ICD-10-CM | POA: Diagnosis not present

## 2018-01-26 ENCOUNTER — Encounter: Payer: Self-pay | Admitting: Family Medicine

## 2018-03-03 ENCOUNTER — Ambulatory Visit: Payer: Managed Care, Other (non HMO) | Admitting: Psychology

## 2018-03-28 ENCOUNTER — Encounter: Payer: Self-pay | Admitting: Family Medicine

## 2018-04-07 ENCOUNTER — Ambulatory Visit (INDEPENDENT_AMBULATORY_CARE_PROVIDER_SITE_OTHER): Payer: 59 | Admitting: Psychology

## 2018-04-07 DIAGNOSIS — F4323 Adjustment disorder with mixed anxiety and depressed mood: Secondary | ICD-10-CM | POA: Diagnosis not present

## 2018-05-12 ENCOUNTER — Ambulatory Visit: Payer: Managed Care, Other (non HMO) | Admitting: Family Medicine

## 2018-05-12 ENCOUNTER — Encounter: Payer: Self-pay | Admitting: Family Medicine

## 2018-05-12 VITALS — BP 122/88 | HR 74 | Temp 98.0°F | Ht 66.0 in | Wt 204.8 lb

## 2018-05-12 DIAGNOSIS — F32A Depression, unspecified: Secondary | ICD-10-CM

## 2018-05-12 DIAGNOSIS — F419 Anxiety disorder, unspecified: Secondary | ICD-10-CM | POA: Diagnosis not present

## 2018-05-12 DIAGNOSIS — F329 Major depressive disorder, single episode, unspecified: Secondary | ICD-10-CM

## 2018-05-12 DIAGNOSIS — N926 Irregular menstruation, unspecified: Secondary | ICD-10-CM

## 2018-05-12 LAB — POCT URINE PREGNANCY: PREG TEST UR: NEGATIVE

## 2018-05-12 MED ORDER — BUPROPION HCL ER (XL) 300 MG PO TB24: 300.0000 mg | ORAL_TABLET | Freq: Every day | ORAL | 1 refills | Status: DC

## 2018-05-12 NOTE — Patient Instructions (Signed)
Nice to see you. We will increase your Wellbutrin. If your depression worsens please let us know.  If you develop thoughts of harming yourself please go to the emergency room. If your menstrual period does not come on please follow-up with your gynecologist.

## 2018-05-12 NOTE — Assessment & Plan Note (Signed)
Discussed options of increasing Zoloft versus increasing Wellbutrin.  Patient opted for will be used.  She will monitor her symptoms.  We will see her back in 2 months.  Given return precautions.

## 2018-05-12 NOTE — Progress Notes (Signed)
  Tommi Rumps, MD Phone: [phone]  Melinda Cortez is a 37 y.o. female who presents today for f/u.  CC: anxiety/depression, late menses  Anxiety/depression: Patient notes she had quite a bit of anxiety after her son was born previously.  The OB increased the Zoloft and that did significantly help with that.  Notes this is better though her depression is creeping back again.  Notes it is not all day every day.  She is on Wellbutrin and Zoloft.  No SI.  She does see a Social worker.  Late menses: Patient notes she started back with her menstrual cycle 6 weeks after the birth of her son.  Notes she had been having a cycle typically once monthly though the day would vary slightly. Notes she has not ever been particularly regular with cycles. Cycles typically last 5 days.  Notes she is about 5 days late.  No abdominal pain.  Social History   Tobacco Use  Smoking Status Former Smoker  . Packs/day: 0.25  . Years: 16.00  . Pack years: 4.00  . Types: Cigarettes  . Last attempt to quit: 01/07/2015  . Years since quitting: 3.3  Smokeless Tobacco Never Used     ROS see history of present illness  Objective  Physical Exam Vitals:   05/12/18 1034  BP: 122/88  Pulse: 74  Temp: 98 F (36.7 C)  SpO2: 95%    BP Readings from Last 3 Encounters:  05/12/18 122/88  12/25/17 126/78  12/08/17 (!) 134/92   Wt Readings from Last 3 Encounters:  05/12/18 204 lb 12.8 oz (92.9 kg)  12/25/17 226 lb (102.5 kg)  12/08/17 228 lb (103.4 kg)    Physical Exam  Constitutional: No distress.  Cardiovascular: Normal rate, regular rhythm and normal heart sounds.  Pulmonary/Chest: Effort normal and breath sounds normal.  Neurological: She is alert.  Skin: Skin is warm and dry. She is not diaphoretic.     Assessment/Plan: Please see individual problem list.  Anxiety and depression Discussed options of increasing Zoloft versus increasing Wellbutrin.  Patient opted for will be used.  She will  monitor her symptoms.  We will see her back in 2 months.  Given return precautions.  Late period Menstrual cycle slightly late.  Urine pregnancy test negative.  If menses does not come on she will follow-up with gynecology.    Orders Placed This Encounter  Procedures  . POCT urine pregnancy    Meds ordered this encounter  Medications  . buPROPion (WELLBUTRIN XL) 300 MG 24 hr tablet    Sig: Take 1 tablet (300 mg total) by mouth daily.    Dispense:  90 tablet    Refill:  1     Tommi Rumps, MD [place]

## 2018-05-12 NOTE — Assessment & Plan Note (Addendum)
Menstrual cycle slightly late.  Urine pregnancy test negative.  If menses does not come on she will follow-up with gynecology.

## 2018-05-21 ENCOUNTER — Ambulatory Visit: Payer: 59 | Admitting: Psychology

## 2018-05-21 DIAGNOSIS — F4323 Adjustment disorder with mixed anxiety and depressed mood: Secondary | ICD-10-CM | POA: Diagnosis not present

## 2018-06-25 ENCOUNTER — Ambulatory Visit: Payer: 59 | Admitting: Psychology

## 2018-06-25 DIAGNOSIS — F4323 Adjustment disorder with mixed anxiety and depressed mood: Secondary | ICD-10-CM | POA: Diagnosis not present

## 2018-07-14 ENCOUNTER — Ambulatory Visit: Payer: Managed Care, Other (non HMO) | Admitting: Family Medicine

## 2018-07-14 ENCOUNTER — Encounter: Payer: Self-pay | Admitting: Family Medicine

## 2018-07-14 VITALS — BP 138/106 | HR 73 | Temp 97.8°F | Ht 66.0 in | Wt 207.6 lb

## 2018-07-14 DIAGNOSIS — F329 Major depressive disorder, single episode, unspecified: Secondary | ICD-10-CM

## 2018-07-14 DIAGNOSIS — F419 Anxiety disorder, unspecified: Secondary | ICD-10-CM

## 2018-07-14 DIAGNOSIS — R030 Elevated blood-pressure reading, without diagnosis of hypertension: Secondary | ICD-10-CM

## 2018-07-14 DIAGNOSIS — Z23 Encounter for immunization: Secondary | ICD-10-CM

## 2018-07-14 NOTE — Progress Notes (Signed)
  Tommi Rumps, MD Phone: [phone]  Melinda Cortez is a 37 y.o. female who presents today for f/u.  CC: anxiety/depression, elevated BP  Anxiety/depression: Patient notes this is improved.  She continues on Wellbutrin and Zoloft.  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  That has helped significantly.  She sees a therapist once monthly.  No SI.  Elevated blood pressure: She does not check it at home.  No chest pain, shortness breath, or edema.  Social History   Tobacco Use  Smoking Status Former Smoker  . Packs/day: 0.25  . Years: 16.00  . Pack years: 4.00  . Types: Cigarettes  . Last attempt to quit: 01/07/2015  . Years since quitting: 3.5  Smokeless Tobacco Never Used     ROS see history of present illness  Objective  Physical Exam Vitals:   07/14/18 1323  BP: (!) 140/98  Pulse: 73  Temp: 97.8 F (36.6 C)  SpO2: 96%    BP Readings from Last 3 Encounters:  07/14/18 (!) 140/98  05/12/18 122/88  12/25/17 126/78   Wt Readings from Last 3 Encounters:  07/14/18 207 lb 9.6 oz (94.2 kg)  05/12/18 204 lb 12.8 oz (92.9 kg)  12/25/17 226 lb (102.5 kg)    Physical Exam  Constitutional: No distress.  Cardiovascular: Normal rate, regular rhythm and normal heart sounds.  Pulmonary/Chest: Effort normal and breath sounds normal.  Neurological: She is alert.  Skin: Skin is warm and dry. She is not diaphoretic.     Assessment/Plan: Please see individual problem list.  Anxiety and depression Much improved.  Blood pressure is a little elevated today.  Potentially could be related to the Wellbutrin though she is not checking at home.  We will have her check at home and if it is elevated over the next several days at home she will contact us and we will decrease the Wellbutrin dose back to 150 mg daily.  If it is normal at home we can continue with her Wellbutrin as it is.  Elevated BP without diagnosis of hypertension Elevated today.  No  history of hypertension.  Could be related to the Wellbutrin.  She will check daily at home for the next several days.  If elevated at home she will contact us and we will decrease her Wellbutrin dose.  If normal at home she will return for BP check with nursing next week.    Orders Placed This Encounter  Procedures  . Flu Vaccine QUAD 6+ mos PF IM (Fluarix Quad PF)    No orders of the defined types were placed in this encounter.    Tommi Rumps, MD [place]

## 2018-07-14 NOTE — Assessment & Plan Note (Signed)
Elevated today.  No history of hypertension.  Could be related to the Wellbutrin.  She will check daily at home for the next several days.  If elevated at home she will contact us and we will decrease her Wellbutrin dose.  If normal at home she will return for BP check with nursing next week.

## 2018-07-14 NOTE — Assessment & Plan Note (Signed)
Much improved.  Blood pressure is a little elevated today.  Potentially could be related to the Wellbutrin though she is not checking at home.  We will have her check at home and if it is elevated over the next several days at home she will contact us and we will decrease the Wellbutrin dose back to 150 mg daily.  If it is normal at home we can continue with her Wellbutrin as it is.

## 2018-07-14 NOTE — Patient Instructions (Signed)
Nice to see you. Please start checking your BP at home. We will have you return early next week to recheck your BP.

## 2018-07-24 ENCOUNTER — Ambulatory Visit (INDEPENDENT_AMBULATORY_CARE_PROVIDER_SITE_OTHER): Payer: Managed Care, Other (non HMO) | Admitting: *Deleted

## 2018-07-24 VITALS — BP 118/80 | HR 77 | Ht 66.0 in | Wt 207.6 lb

## 2018-07-24 DIAGNOSIS — R030 Elevated blood-pressure reading, without diagnosis of hypertension: Secondary | ICD-10-CM | POA: Diagnosis not present

## 2018-07-24 NOTE — Progress Notes (Signed)
Pt came is to have BP rechecked today after elevated BP at visit on 07/14/18 (140/98).  BP in office today was: 124/78 (right arm) & 118/80 (left arm).  She has been keeping track of her BP reading at home on her phone. (See list provided in your folder/desk)  Pt denies headache, blurred vision, or dizziness.

## 2018-07-27 ENCOUNTER — Encounter: Payer: Self-pay | Admitting: *Deleted

## 2018-07-27 NOTE — Progress Notes (Signed)
Sent mychart message

## 2018-08-11 ENCOUNTER — Ambulatory Visit: Payer: 59 | Admitting: Psychology

## 2018-08-11 DIAGNOSIS — F4323 Adjustment disorder with mixed anxiety and depressed mood: Secondary | ICD-10-CM | POA: Diagnosis not present

## 2018-08-25 ENCOUNTER — Encounter: Payer: Self-pay | Admitting: Family Medicine

## 2018-08-26 ENCOUNTER — Ambulatory Visit: Payer: Managed Care, Other (non HMO) | Admitting: Family Medicine

## 2018-08-26 ENCOUNTER — Encounter: Payer: Self-pay | Admitting: Family Medicine

## 2018-08-26 VITALS — BP 126/84 | HR 76 | Temp 97.6°F | Ht 66.0 in | Wt 223.0 lb

## 2018-08-26 DIAGNOSIS — H6690 Otitis media, unspecified, unspecified ear: Secondary | ICD-10-CM

## 2018-08-26 DIAGNOSIS — H9203 Otalgia, bilateral: Secondary | ICD-10-CM | POA: Diagnosis not present

## 2018-08-26 MED ORDER — METHYLPREDNISOLONE 4 MG PO TBPK: ORAL_TABLET | ORAL | 0 refills | Status: DC

## 2018-08-26 NOTE — Patient Instructions (Signed)
Otitis Media, Adult Otitis media is redness, soreness, and puffiness (swelling) in the space just behind your eardrum (middle ear). It may be caused by allergies or infection. It often happens along with a cold. Follow these instructions at home:  Take your medicine as told. Finish it even if you start to feel better.  Only take over-the-counter or prescription medicines for pain, discomfort, or fever as told by your doctor.  Follow up with your doctor as told. Contact a doctor if:  You have otitis media only in one ear, or bleeding from your nose, or both.  You notice a lump on your neck.  You are not getting better in 5-7 days.  You feel worse instead of better. Get help right away if:  You have pain that is not helped with medicine.  You have puffiness, redness, or pain around your ear.  You get a stiff neck.  You cannot move part of your face (paralysis).  You notice that the bone behind your ear hurts when you touch it. This information is not intended to replace advice given to you by your health care provider. Make sure you discuss any questions you have with your health care provider. Document Released: 02/26/2008 Document Revised: 02/15/2016 Document Reviewed: 04/06/2013 Elsevier Interactive Patient Education  2017 [employer].

## 2018-08-26 NOTE — Progress Notes (Signed)
Subjective:    Patient ID: Melinda Cortez, female    DOB: 1981-06-01, 37 y.o.   MRN: [id number]  HPI  Presents to clinic for follow up on ear pain after going to urgent care, [facility]. She was diagnosed with ear infection and prescribed augmentin BID for 10 days. She is on day 3 of her augmentin course.  Patient states that her ears do feel slightly better today, but still continue to hurt and at times have shooting pain. She also c/o left jaw pain, sore throat and migraine headache off and on.   Patient denies any fever or chills.  Patient states her energy level has been down, and she has been sleeping a lot for the past 3 to 4 days.  Patient Active Problem List   Diagnosis Date Noted  . Late period 05/12/2018  . BMI 38.0-38.9,adult 09/09/2017  . Migraine 03/04/2017  . Elevated BP without diagnosis of hypertension 01/14/2017  . Allergic rhinitis 09/12/2016  . Anxiety and depression 01/30/2016   Social History   Tobacco Use  . Smoking status: Former Smoker    Packs/day: 0.25    Years: 16.00    Pack years: 4.00    Types: Cigarettes    Last attempt to quit: 01/07/2015    Years since quitting: 3.6  . Smokeless tobacco: Never Used  Substance Use Topics  . Alcohol use: Yes    Alcohol/week: 0.0 standard drinks    Comment: rare   Review of Systems   Constitutional: +fatigue. Negative for chills, and fever.  HENT: +ear pain, sinus pain and sore throat.   Eyes: Negative.   Respiratory: Negative for cough, shortness of breath and wheezing.   Cardiovascular: Negative for chest pain, palpitations and leg swelling.  Gastrointestinal: Negative for abdominal pain, diarrhea, nausea and vomiting.  Genitourinary: Negative for dysuria, frequency and urgency.  Musculoskeletal: Negative for arthralgias and myalgias.  Skin: Negative for color change, pallor and rash.  Neurological: Negative for syncope, light-headedness. +headache.  Psychiatric/Behavioral: The patient is not  nervous/anxious.      Objective:   Physical Exam  Constitutional: She is oriented to person, place, and time.  HENT:  Head: Normocephalic and atraumatic.  Nose: Mucosal edema and sinus tenderness present.  Mouth/Throat: Oropharynx is clear and moist. No oropharyngeal exudate.  Jaw ROM normal. +fullness and pink bilateral TMs, no perforation.   Eyes: Conjunctivae and EOM are normal. No scleral icterus.  Neck: Normal range of motion. Neck supple. No JVD present. No tracheal deviation present.  Cardiovascular: Normal rate, regular rhythm and normal heart sounds.  Pulmonary/Chest: Effort normal and breath sounds normal.  Lymphadenopathy:    She has no cervical adenopathy.  Neurological: She is alert and oriented to person, place, and time.  Skin: Skin is warm and dry. No pallor.  Psychiatric: She has a normal mood and affect. Her behavior is normal.  Nursing note and vitals reviewed.  Vitals:   08/26/18 1009 08/26/18 1020  BP: (!) 138/98 126/84  Pulse: 76   Temp: 97.6 F (36.4 C)   SpO2: 98%       Assessment & Plan:   Acute otitis media/bilateral ear pain- patient advised to finish Augmentin course as prescribed.  Patient also encouraged to continue taking daily allergy medication as she has been doing.  Patient will take steroid taper to help reduce inflammation/pain in ears.  Patient reassured that her ears appear pink today, most likely were very red and inflamed when she went to urgent care and  this is why she was started on antibiotics at the urgent care.  Patient's throat looks clear no signs of fish drip noted on physical exam, patient reassured she also has no swollen lymph nodes in the cervical area.  Patient advised that it can take 7 to 10 days to fully feel better from ear infection, and each day her symptoms should slowly begin to improve.  Patient advised that she can use Tylenol or Motrin as needed for any pain, advised to increase fluid intake and be sure to get plenty of  rest.  Patient will keep regularly scheduled follow-up with PCP as planned.  Patient advised to return to clinic sooner if new issues arise or if current symptoms persist or worsen.

## 2018-09-03 ENCOUNTER — Encounter: Payer: Self-pay | Admitting: Family Medicine

## 2018-09-10 ENCOUNTER — Other Ambulatory Visit: Payer: Self-pay | Admitting: Family Medicine

## 2018-09-11 MED ORDER — FLUCONAZOLE 150 MG PO TABS: 150.0000 mg | ORAL_TABLET | ORAL | 0 refills | Status: DC

## 2018-09-11 NOTE — Addendum Note (Signed)
Addended by: Leone Haven on: 09/11/2018 12:28 PM   Modules accepted: Orders

## 2018-09-29 ENCOUNTER — Ambulatory Visit (INDEPENDENT_AMBULATORY_CARE_PROVIDER_SITE_OTHER): Payer: Managed Care, Other (non HMO) | Admitting: Psychology

## 2018-09-29 DIAGNOSIS — F4323 Adjustment disorder with mixed anxiety and depressed mood: Secondary | ICD-10-CM | POA: Diagnosis not present

## 2018-10-16 ENCOUNTER — Encounter: Payer: Self-pay | Admitting: Family Medicine

## 2018-10-16 MED ORDER — OSELTAMIVIR PHOSPHATE 75 MG PO CAPS: 75.0000 mg | ORAL_CAPSULE | Freq: Every day | ORAL | 0 refills | Status: DC

## 2018-10-20 ENCOUNTER — Ambulatory Visit: Payer: Self-pay | Admitting: Family Medicine

## 2018-10-28 ENCOUNTER — Ambulatory Visit (INDEPENDENT_AMBULATORY_CARE_PROVIDER_SITE_OTHER): Payer: 59 | Admitting: Psychology

## 2018-10-28 DIAGNOSIS — F4323 Adjustment disorder with mixed anxiety and depressed mood: Secondary | ICD-10-CM

## 2018-11-03 ENCOUNTER — Ambulatory Visit (INDEPENDENT_AMBULATORY_CARE_PROVIDER_SITE_OTHER): Payer: Managed Care, Other (non HMO) | Admitting: Family Medicine

## 2018-11-03 ENCOUNTER — Encounter: Payer: Self-pay | Admitting: Family Medicine

## 2018-11-03 DIAGNOSIS — E669 Obesity, unspecified: Secondary | ICD-10-CM | POA: Diagnosis not present

## 2018-11-03 DIAGNOSIS — F419 Anxiety disorder, unspecified: Secondary | ICD-10-CM | POA: Diagnosis not present

## 2018-11-03 DIAGNOSIS — F329 Major depressive disorder, single episode, unspecified: Secondary | ICD-10-CM

## 2018-11-03 DIAGNOSIS — J309 Allergic rhinitis, unspecified: Secondary | ICD-10-CM

## 2018-11-03 DIAGNOSIS — R030 Elevated blood-pressure reading, without diagnosis of hypertension: Secondary | ICD-10-CM

## 2018-11-03 NOTE — Assessment & Plan Note (Signed)
Asymptomatic.  Continue Zyrtec.

## 2018-11-03 NOTE — Assessment & Plan Note (Signed)
Asymptomatic.  She will continue to see the counselor.  Continue Wellbutrin and Zoloft.

## 2018-11-03 NOTE — Assessment & Plan Note (Signed)
Encouraged increasing the intensity of her exercise.  Discussed resuming weight watchers.

## 2018-11-03 NOTE — Patient Instructions (Signed)
Nice to see you. Please continue with Wellbutrin and Zoloft and seeing the counselor.  If your depression worsens please let us know. Please try to make some dietary changes.  I think weight watchers would be a good idea.  Please try to add in some more strenuous exercise as well.

## 2018-11-03 NOTE — Progress Notes (Signed)
  Tommi Rumps, MD Phone: [phone]  Melinda Cortez is a 38 y.o. female who presents today for follow-up.  CC: Depression, obesity, allergies, blood pressure  Depression: Notes this is good.  No significant symptoms.  No anxiety.  She is on Wellbutrin and Zoloft.  Seeing a counselor has been helpful.  No SI.  Obesity: She has been walking 4 to 5 days a week for 30 to 60 minutes at a time.  She has not been doing great with her diet.  In the past weight watchers has been quite helpful.  Allergies: She currently notes no allergy symptoms.  She does take some Zyrtec.  She was treated for influenza though she thinks she more had a viral upper respiratory illness.  She never developed aches or fever.  She notes the symptoms have resolved.  Elevated blood pressure: Notes it was similar to today on check last night.  No chest pain or shortness of breath.  Social History   Tobacco Use  Smoking Status Former Smoker  . Packs/day: 0.25  . Years: 16.00  . Pack years: 4.00  . Types: Cigarettes  . Last attempt to quit: 01/07/2015  . Years since quitting: 3.8  Smokeless Tobacco Never Used     ROS see history of present illness  Objective  Physical Exam Vitals:   11/03/18 1013  BP: 114/82  Pulse: 75  Temp: 98.5 F (36.9 C)  SpO2: 98%    BP Readings from Last 3 Encounters:  11/03/18 114/82  08/26/18 126/84  07/24/18 118/80   Wt Readings from Last 3 Encounters:  11/03/18 232 lb 12.8 oz (105.6 kg)  08/26/18 223 lb (101.2 kg)  07/24/18 207 lb 9.6 oz (94.2 kg)    Physical Exam Constitutional:      General: She is not in acute distress.    Appearance: She is not diaphoretic.  Cardiovascular:     Rate and Rhythm: Normal rate and regular rhythm.     Heart sounds: Normal heart sounds.  Pulmonary:     Effort: Pulmonary effort is normal.     Breath sounds: Normal breath sounds.  Musculoskeletal:     Right lower leg: No edema.     Left lower leg: No edema.  Skin:  General: Skin is warm and dry.  Neurological:     Mental Status: She is alert.      Assessment/Plan: Please see individual problem list.  Allergic rhinitis Asymptomatic.  Continue Zyrtec.  Anxiety and depression Asymptomatic.  She will continue to see the counselor.  Continue Wellbutrin and Zoloft.  Elevated BP without diagnosis of hypertension Normal BP today.  She will periodically monitor.  Obesity (BMI 35.0-39.9 without comorbidity) Encouraged increasing the intensity of her exercise.  Discussed resuming weight watchers.   Health Maintenance: Plan for screening lab work at next visit.  No orders of the defined types were placed in this encounter.   No orders of the defined types were placed in this encounter.    Tommi Rumps, MD [place]

## 2018-11-03 NOTE — Assessment & Plan Note (Signed)
Normal BP today.  She will periodically monitor.

## 2018-12-02 ENCOUNTER — Other Ambulatory Visit: Payer: Self-pay

## 2018-12-02 ENCOUNTER — Ambulatory Visit: Payer: 59 | Admitting: Psychology

## 2018-12-02 DIAGNOSIS — F4323 Adjustment disorder with mixed anxiety and depressed mood: Secondary | ICD-10-CM

## 2019-01-19 ENCOUNTER — Ambulatory Visit (INDEPENDENT_AMBULATORY_CARE_PROVIDER_SITE_OTHER): Payer: 59 | Admitting: Psychology

## 2019-01-19 DIAGNOSIS — F4323 Adjustment disorder with mixed anxiety and depressed mood: Secondary | ICD-10-CM

## 2019-02-18 ENCOUNTER — Ambulatory Visit (INDEPENDENT_AMBULATORY_CARE_PROVIDER_SITE_OTHER): Payer: 59 | Admitting: Psychology

## 2019-02-18 DIAGNOSIS — F4323 Adjustment disorder with mixed anxiety and depressed mood: Secondary | ICD-10-CM | POA: Diagnosis not present

## 2019-04-06 ENCOUNTER — Ambulatory Visit (INDEPENDENT_AMBULATORY_CARE_PROVIDER_SITE_OTHER): Payer: 59 | Admitting: Psychology

## 2019-04-06 DIAGNOSIS — F4323 Adjustment disorder with mixed anxiety and depressed mood: Secondary | ICD-10-CM | POA: Diagnosis not present

## 2019-04-08 ENCOUNTER — Other Ambulatory Visit: Payer: Self-pay | Admitting: Family Medicine

## 2019-05-13 ENCOUNTER — Telehealth: Payer: Self-pay

## 2019-05-13 NOTE — Telephone Encounter (Signed)
Copied from [place] [phone]. Topic: General - Other >> May 13, 2019  1:26 PM Keene Breath wrote: Reason for CRM: Patient is returning a call regarding her appt.  Please call back at [phone]

## 2019-05-14 ENCOUNTER — Other Ambulatory Visit: Payer: Self-pay

## 2019-05-17 ENCOUNTER — Encounter: Payer: Self-pay | Admitting: Family Medicine

## 2019-05-17 ENCOUNTER — Ambulatory Visit: Payer: Managed Care, Other (non HMO) | Admitting: Family Medicine

## 2019-05-17 ENCOUNTER — Other Ambulatory Visit: Payer: Self-pay

## 2019-05-17 VITALS — BP 120/80 | HR 79 | Temp 97.2°F | Ht 66.0 in | Wt 234.4 lb

## 2019-05-17 DIAGNOSIS — F32A Depression, unspecified: Secondary | ICD-10-CM

## 2019-05-17 DIAGNOSIS — K219 Gastro-esophageal reflux disease without esophagitis: Secondary | ICD-10-CM | POA: Diagnosis not present

## 2019-05-17 DIAGNOSIS — F419 Anxiety disorder, unspecified: Secondary | ICD-10-CM

## 2019-05-17 DIAGNOSIS — Z23 Encounter for immunization: Secondary | ICD-10-CM

## 2019-05-17 DIAGNOSIS — E669 Obesity, unspecified: Secondary | ICD-10-CM

## 2019-05-17 DIAGNOSIS — G43009 Migraine without aura, not intractable, without status migrainosus: Secondary | ICD-10-CM

## 2019-05-17 DIAGNOSIS — F329 Major depressive disorder, single episode, unspecified: Secondary | ICD-10-CM

## 2019-05-17 MED ORDER — OMEPRAZOLE 20 MG PO CPDR: 20.0000 mg | DELAYED_RELEASE_CAPSULE | Freq: Every day | ORAL | 1 refills | Status: DC

## 2019-05-17 NOTE — Progress Notes (Signed)
Tommi Rumps, MD Phone: [phone]  Melinda Cortez is a 38 y.o. female who presents today for follow-up.  Depression: Notes this is adequately controlled.  She does occasionally have times when this is bothering her though mostly it is well controlled.  Taking Zoloft and Wellbutrin.  No SI.  No anxiety.  Migraines: Patient notes migraines only occur around her cycle for 2 to 3 days during her menstrual cycle.  They are bothersome and she will take Excedrin Migraine and then she will be able to function.  They are frontal headaches that occur behind her eyes and feels like visors on her forehead.  No aura.  No numbness.  No weakness.  Obesity: Patient has been doing weight watchers for the last 7 weeks.  She is down 13 pounds.  She is walking 30 minutes 2 to 4 days a week.  She was doing P90 X though she aggravated an abdominal muscle with this and backed off on that and that resolved.  GERD: Patient has had issues with reflux since having her son 18 months ago.  She has been taking Prilosec over-the-counter daily before bed with some benefit.  If she does not take it she will get an intense burning with reflux sensation and sour taste in her throat.  No blood in her stool.  No abdominal pain.  No dysphagia.  No specific foods bring this on.  Social History   Tobacco Use  Smoking Status Former Smoker  . Packs/day: 0.25  . Years: 16.00  . Pack years: 4.00  . Types: Cigarettes  . Quit date: 01/07/2015  . Years since quitting: 4.3  Smokeless Tobacco Never Used     ROS see history of present illness  Objective  Physical Exam Vitals:   05/17/19 0849  BP: 120/80  Pulse: 79  Temp: (!) 97.2 F (36.2 C)  SpO2: 98%    BP Readings from Last 3 Encounters:  05/17/19 120/80  11/03/18 114/82  08/26/18 126/84   Wt Readings from Last 3 Encounters:  05/17/19 234 lb 6.4 oz (106.3 kg)  11/03/18 232 lb 12.8 oz (105.6 kg)  08/26/18 223 lb (101.2 kg)    Physical Exam  Constitutional:      General: She is not in acute distress.    Appearance: She is not diaphoretic.  HENT:     Head: Normocephalic and atraumatic.  Cardiovascular:     Rate and Rhythm: Normal rate and regular rhythm.     Heart sounds: Normal heart sounds.  Pulmonary:     Effort: Pulmonary effort is normal.     Breath sounds: Normal breath sounds.  Abdominal:     General: Bowel sounds are normal. There is no distension.     Palpations: Abdomen is soft.     Tenderness: There is no abdominal tenderness. There is no guarding or rebound.  Musculoskeletal:     Right lower leg: No edema.     Left lower leg: No edema.  Skin:    General: Skin is warm and dry.  Neurological:     Mental Status: She is alert.     Comments: CN 3-12 intact, 5/5 strength in bilateral biceps, triceps, grip, quads, hamstrings, plantar and dorsiflexion, sensation to light touch intact in bilateral UE and LE, normal gait      Assessment/Plan: Please see individual problem list.  Migraine Only occurs during menses. She will continue prn excedrin migraine. She will monitor for changes. Could consider effexor in the future given her depression though  given that her depression is adequately controlled at this time we will not make that change.   Anxiety and depression Well controlled. Continue current regimen.   GERD (gastroesophageal reflux disease) Patient will take omeprazole 20 mg daily 30 minutes prior to breakfast for 1 month then go to every other day for one month with pepcid if needed on the days she does not take omeprazole and then she can discontinue omeprazole. If she has recurrent symptoms she will take the omeprazole daily. She will send a message in one month to let us know how it is going with this.   Obesity (BMI 35.0-39.9 without comorbidity) Encouraged continued dietary changes and exercise.      Orders Placed This Encounter  Procedures  . Flu Vaccine QUAD 6+ mos PF IM (Fluarix Quad PF)     Meds ordered this encounter  Medications  . omeprazole (PRILOSEC) 20 MG capsule    Sig: Take 1 capsule (20 mg total) by mouth daily.    Dispense:  30 capsule    Refill:  [place], MD [place]

## 2019-05-17 NOTE — Assessment & Plan Note (Signed)
Well-controlled.  Continue current regimen. 

## 2019-05-17 NOTE — Assessment & Plan Note (Signed)
Only occurs during menses. She will continue prn excedrin migraine. She will monitor for changes. Could consider effexor in the future given her depression though given that her depression is adequately controlled at this time we will not make that change.

## 2019-05-17 NOTE — Patient Instructions (Addendum)
Nice to see you. Please continue with your depression medication. Please monitor your migraines and if they change or worsen please let us know. Please continue to work on diet and exercise. please start the Prilosec daily 30 minutes before breakfast For the next month and then take it every day 30 minutes before breakfast for a month and then discontinue.  If you have symptoms when going to every other day you can take Pepcid over-the-counter.  If Pepcid is adequately controlling your symptoms you can continue this after discontinuing Prilosec.  Please send [place] a message in a little over a month to let us know how this is working.

## 2019-05-17 NOTE — Assessment & Plan Note (Signed)
Encouraged continued dietary changes and exercise. 

## 2019-05-17 NOTE — Assessment & Plan Note (Signed)
Patient will take omeprazole 20 mg daily 30 minutes prior to breakfast for 1 month then go to every other day for one month with pepcid if needed on the days she does not take omeprazole and then she can discontinue omeprazole. If she has recurrent symptoms she will take the omeprazole daily. She will send a message in one month to let us know how it is going with this.

## 2019-06-02 ENCOUNTER — Ambulatory Visit (INDEPENDENT_AMBULATORY_CARE_PROVIDER_SITE_OTHER): Payer: 59 | Admitting: Psychology

## 2019-06-02 DIAGNOSIS — F4323 Adjustment disorder with mixed anxiety and depressed mood: Secondary | ICD-10-CM | POA: Diagnosis not present

## 2019-06-20 ENCOUNTER — Other Ambulatory Visit: Payer: Self-pay | Admitting: Family Medicine

## 2019-07-15 ENCOUNTER — Other Ambulatory Visit: Payer: Self-pay | Admitting: Family Medicine

## 2019-07-22 ENCOUNTER — Ambulatory Visit (INDEPENDENT_AMBULATORY_CARE_PROVIDER_SITE_OTHER): Payer: 59 | Admitting: Psychology

## 2019-07-22 DIAGNOSIS — F4323 Adjustment disorder with mixed anxiety and depressed mood: Secondary | ICD-10-CM

## 2019-09-10 ENCOUNTER — Other Ambulatory Visit: Payer: Self-pay | Admitting: Internal Medicine

## 2019-09-30 ENCOUNTER — Ambulatory Visit (INDEPENDENT_AMBULATORY_CARE_PROVIDER_SITE_OTHER): Payer: 59 | Admitting: Psychology

## 2019-09-30 DIAGNOSIS — F4323 Adjustment disorder with mixed anxiety and depressed mood: Secondary | ICD-10-CM

## 2019-10-28 ENCOUNTER — Encounter: Payer: Self-pay | Admitting: Family Medicine

## 2019-11-18 ENCOUNTER — Ambulatory Visit (INDEPENDENT_AMBULATORY_CARE_PROVIDER_SITE_OTHER): Payer: 59 | Admitting: Psychology

## 2019-11-18 DIAGNOSIS — F4323 Adjustment disorder with mixed anxiety and depressed mood: Secondary | ICD-10-CM

## 2019-11-19 ENCOUNTER — Encounter: Payer: Self-pay | Admitting: Family Medicine

## 2019-11-19 ENCOUNTER — Ambulatory Visit (INDEPENDENT_AMBULATORY_CARE_PROVIDER_SITE_OTHER): Payer: Managed Care, Other (non HMO) | Admitting: Family Medicine

## 2019-11-19 ENCOUNTER — Other Ambulatory Visit: Payer: Self-pay

## 2019-11-19 DIAGNOSIS — F32A Depression, unspecified: Secondary | ICD-10-CM

## 2019-11-19 DIAGNOSIS — E669 Obesity, unspecified: Secondary | ICD-10-CM

## 2019-11-19 DIAGNOSIS — F419 Anxiety disorder, unspecified: Secondary | ICD-10-CM

## 2019-11-19 DIAGNOSIS — K219 Gastro-esophageal reflux disease without esophagitis: Secondary | ICD-10-CM | POA: Diagnosis not present

## 2019-11-19 DIAGNOSIS — F329 Major depressive disorder, single episode, unspecified: Secondary | ICD-10-CM | POA: Diagnosis not present

## 2019-11-19 MED ORDER — OMEPRAZOLE 20 MG PO CPDR: DELAYED_RELEASE_CAPSULE | ORAL | 1 refills | Status: DC

## 2019-11-19 NOTE — Progress Notes (Signed)
Virtual Visit via video Note  This visit type was conducted due to national recommendations for restrictions regarding the COVID-19 pandemic (e.g. social distancing).  This format is felt to be most appropriate for this patient at this time.  All issues noted in this document were discussed and addressed.  No physical exam was performed (except for noted visual exam findings with Video Visits).   I connected with Melinda Cortez today at  9:00 AM EST by a video enabled telemedicine application and verified that I am speaking with the correct person using two identifiers. Location patient: home Location provider: work  Persons participating in the virtual visit: patient, provider  I discussed the limitations, risks, security and privacy concerns of performing an evaluation and management service by telephone and the availability of in person appointments. I also discussed with the patient that there may be a patient responsible charge related to this service. The patient expressed understanding and agreed to proceed.  Reason for visit: follow-up  HPI: Depression/anxiety: Patient notes minimal anxiety.  Her depression is manageable.  The whole pandemic has been difficult.  She is on Wellbutrin and Zoloft.  She is still seeing her therapist.  No SI.  GERD: Taking Prilosec daily.  No reflux symptoms.  No abdominal pain.  No blood in her stool.  No dysphagia.  Typically bothers her laying down at night when she does have reflux.  She notes the Prilosec completely eliminates her symptoms.  She has not identified any food triggers.  Obesity: Patient was doing well with her diet though she fell off the wagon.  She had started to exercise though she messed up her back and now that that is improving she hopes to get back outside to do some activity.   ROS: See pertinent positives and negatives per HPI.  Past Medical History:  Diagnosis Date  . Allergic rhinitis   . Anxiety   . Chronic headaches     HORMONAL   . Complication of anesthesia   . Depression   . Elevated blood pressure   . Elevated blood pressure affecting pregnancy in third trimester, antepartum 10/16/2017  . Gestational hypertension 11/15/2017  . PONV (postoperative nausea and vomiting)     Past Surgical History:  Procedure Laterality Date  . DILATION AND CURETTAGE OF UTERUS    . DILATION AND EVACUATION N/A 01/07/2017   Procedure: DILATATION AND EVACUATION;  Surgeon: Malachy Mood, MD;  Location: [facility] ORS;  Service: Gynecology;  Laterality: N/A;  . TONSILLECTOMY AND ADENOIDECTOMY  1994    Family History  Problem Relation Age of Onset  . Alcoholism Other   . Breast cancer Maternal Aunt 61       Mothers aunt  . Lung cancer Other   . Hypertension Father   . Heart disease Father   . Hypertension Brother   . Hypertension Brother        prediabetes  . Lymphoma Paternal Grandfather   . Schizophrenia Maternal Uncle     SOCIAL HX: Former smoker   Current Outpatient Medications:  .  acetaminophen (TYLENOL) 500 MG tablet, Take 500 mg by mouth every 6 (six) hours as needed., Disp: , Rfl:  .  buPROPion (WELLBUTRIN XL) 300 MG 24 hr tablet, TAKE 1 TABLET BY MOUTH  DAILY, Disp: 90 tablet, Rfl: 3 .  cetirizine (ZYRTEC) 10 MG tablet, Take 10 mg by mouth every morning. , Disp: , Rfl:  .  diphenhydrAMINE (BENADRYL) 25 MG tablet, Take 25 mg by mouth at bedtime as  needed for sleep., Disp: , Rfl:  .  ibuprofen (ADVIL,MOTRIN) 600 MG tablet, Take 1 tablet (600 mg total) by mouth every 6 (six) hours as needed for mild pain, moderate pain or cramping., Disp: 30 tablet, Rfl: 0 .  omeprazole (PRILOSEC) 20 MG capsule, TAKE 1 CAPSULE BY MOUTH EVERY DAY, Disp: 90 capsule, Rfl: 1 .  Prenatal Vit-Fe Fumarate-FA (PRENATAL MULTIVITAMIN) TABS tablet, Take 1 tablet by mouth daily. , Disp: , Rfl:  .  sertraline (ZOLOFT) 100 MG tablet, TAKE 1 TABLET BY MOUTH  DAILY, Disp: 90 tablet, Rfl: 3 .  sodium chloride (OCEAN) 0.65 % SOLN nasal spray,  Place 1 spray into both nostrils as needed for congestion., Disp: , Rfl:   EXAM:  VITALS per patient if applicable:  GENERAL: alert, oriented, appears well and in no acute distress  HEENT: atraumatic, conjunttiva clear, no obvious abnormalities on inspection of external nose and ears  NECK: normal movements of the head and neck  LUNGS: on inspection no signs of respiratory distress, breathing rate appears normal, no obvious gross SOB, gasping or wheezing  CV: no obvious cyanosis  MS: moves all visible extremities without noticeable abnormality  PSYCH/NEURO: pleasant and cooperative, no obvious depression or anxiety, speech and thought processing grossly intact  ASSESSMENT AND PLAN:  Discussed the following assessment and plan:  GERD (gastroesophageal reflux disease) Asymptomatic on medication.  No red flags.  She will continue on Prilosec.  If she has any worsening symptoms she will let us know.  Advised to let us know immediately if she develops blood in her stool or dysphagia.  Anxiety and depression Adequately controlled.  Continue current medication and therapy.  Obesity (BMI 35.0-39.9 without comorbidity) She will get back into exercising.  Encouraged to get back to healthy diet.  Plan for lab work with her next in office visit.   No orders of the defined types were placed in this encounter.   Meds ordered this encounter  Medications  . omeprazole (PRILOSEC) 20 MG capsule    Sig: TAKE 1 CAPSULE BY MOUTH EVERY DAY    Dispense:  90 capsule    Refill:  1     I discussed the assessment and treatment plan with the patient. The patient was provided an opportunity to ask questions and all were answered. The patient agreed with the plan and demonstrated an understanding of the instructions.   The patient was advised to call back or seek an in-person evaluation if the symptoms worsen or if the condition fails to improve as anticipated.    Tommi Rumps, MD

## 2019-11-19 NOTE — Assessment & Plan Note (Signed)
She will get back into exercising.  Encouraged to get back to healthy diet.  Plan for lab work with her next in office visit.

## 2019-11-19 NOTE — Assessment & Plan Note (Signed)
Asymptomatic on medication.  No red flags.  She will continue on Prilosec.  If she has any worsening symptoms she will let us know.  Advised to let us know immediately if she develops blood in her stool or dysphagia.

## 2019-11-19 NOTE — Assessment & Plan Note (Signed)
Adequately controlled.  Continue current medication and therapy.

## 2020-01-06 ENCOUNTER — Ambulatory Visit (INDEPENDENT_AMBULATORY_CARE_PROVIDER_SITE_OTHER): Payer: 59 | Admitting: Psychology

## 2020-01-06 DIAGNOSIS — F4323 Adjustment disorder with mixed anxiety and depressed mood: Secondary | ICD-10-CM

## 2020-03-07 ENCOUNTER — Ambulatory Visit (INDEPENDENT_AMBULATORY_CARE_PROVIDER_SITE_OTHER): Payer: 59 | Admitting: Psychology

## 2020-03-07 DIAGNOSIS — F4323 Adjustment disorder with mixed anxiety and depressed mood: Secondary | ICD-10-CM

## 2020-03-17 ENCOUNTER — Other Ambulatory Visit: Payer: Self-pay

## 2020-03-17 ENCOUNTER — Other Ambulatory Visit (HOSPITAL_COMMUNITY)
Admission: RE | Admit: 2020-03-17 | Discharge: 2020-03-17 | Disposition: A | Discharge status: Home / Self Care | Payer: Managed Care, Other (non HMO) | Source: Ambulatory Visit | Attending: Obstetrics and Gynecology | Admitting: Obstetrics and Gynecology

## 2020-03-17 ENCOUNTER — Ambulatory Visit (INDEPENDENT_AMBULATORY_CARE_PROVIDER_SITE_OTHER): Payer: Managed Care, Other (non HMO) | Admitting: Obstetrics and Gynecology

## 2020-03-17 ENCOUNTER — Other Ambulatory Visit: Payer: Self-pay | Admitting: Family Medicine

## 2020-03-17 ENCOUNTER — Encounter: Payer: Self-pay | Admitting: Obstetrics and Gynecology

## 2020-03-17 VITALS — BP 114/70 | Ht 67.0 in | Wt 228.0 lb

## 2020-03-17 DIAGNOSIS — Z01419 Encounter for gynecological examination (general) (routine) without abnormal findings: Secondary | ICD-10-CM | POA: Diagnosis not present

## 2020-03-17 DIAGNOSIS — Z1239 Encounter for other screening for malignant neoplasm of breast: Secondary | ICD-10-CM | POA: Diagnosis not present

## 2020-03-17 DIAGNOSIS — Z124 Encounter for screening for malignant neoplasm of cervix: Secondary | ICD-10-CM

## 2020-03-17 DIAGNOSIS — L900 Lichen sclerosus et atrophicus: Secondary | ICD-10-CM

## 2020-03-17 MED ORDER — CLOBETASOL PROPIONATE 0.05 % EX CREA: 1.0000 "application " | TOPICAL_CREAM | Freq: Two times a day (BID) | CUTANEOUS | 1 refills | Status: AC

## 2020-03-17 NOTE — Progress Notes (Signed)
Gynecology Annual Exam   PCP: Leone Haven, MD  Chief Complaint:  Chief Complaint  Patient presents with  . Gynecologic Exam    history of Lichens, now having flare up needs medication    History of Present Illness: Patient is a 39 y.o. [id number] presents for annual exam. The patient has no complaints today.   LMP: Patient's last menstrual period was 03/02/2020. Average Interval: regular, monthly Duration of flow: 5 days Heavy Menses: no Clots: no Intermenstrual Bleeding: no Postcoital Bleeding: no Dysmenorrhea: no  The patient is sexually active. She currently uses vasectomy for contraception. She denies dyspareunia.  The patient does perform self breast exams.  There is no notable family history of breast or ovarian cancer in her family.  The patient wears seatbelts: yes.   The patient has regular exercise: not asked.    The patient denies current symptoms of depression.    Review of Systems: Review of Systems  Constitutional: Negative for chills and fever.  HENT: Negative for congestion.   Respiratory: Negative for cough and shortness of breath.   Cardiovascular: Negative for chest pain and palpitations.  Gastrointestinal: Negative for abdominal pain, constipation, diarrhea, heartburn, nausea and vomiting.  Genitourinary: Negative for dysuria, frequency and urgency.  Skin: Negative for itching and rash.  Neurological: Negative for dizziness and headaches.  Endo/Heme/Allergies: Negative for polydipsia.  Psychiatric/Behavioral: Negative for depression.    Past Medical History:  Patient Active Problem List   Diagnosis Date Noted  . GERD (gastroesophageal reflux disease) 05/17/2019  . Late period 05/12/2018  . Obesity (BMI 35.0-39.9 without comorbidity) 09/09/2017  . Migraine 03/04/2017  . Elevated BP without diagnosis of hypertension 01/14/2017  . Allergic rhinitis 09/12/2016  . Anxiety and depression 01/30/2016    Past Surgical History:  Past Surgical  History:  Procedure Laterality Date  . DILATION AND CURETTAGE OF UTERUS    . DILATION AND EVACUATION N/A 01/07/2017   Procedure: DILATATION AND EVACUATION;  Surgeon: Malachy Mood, MD;  Location: [facility] ORS;  Service: Gynecology;  Laterality: N/A;  . TONSILLECTOMY AND ADENOIDECTOMY  1994    Gynecologic History:  Patient's last menstrual period was 03/02/2020. Contraception: vasectomy Last Pap: Results were:11/27/2016 NIL and HR HPV negative   Obstetric History: [id number]  Family History:  Family History  Problem Relation Age of Onset  . Alcoholism Other   . Breast cancer Maternal Aunt 56       Mothers aunt  . Lung cancer Other   . Hypertension Father   . Heart disease Father   . Hypertension Brother   . Hypertension Brother        prediabetes  . Lymphoma Paternal Grandfather   . Schizophrenia Maternal Uncle     Social History:  Social History   Socioeconomic History  . Marital status: Married    Spouse name: Not on file  . Number of children: 1  . Years of education: Not on file  . Highest education level: Not on file  Occupational History  . Not on file  Tobacco Use  . Smoking status: Former Smoker    Packs/day: 0.25    Years: 16.00    Pack years: 4.00    Types: Cigarettes    Quit date: 01/07/2015    Years since quitting: 5.2  . Smokeless tobacco: Never Used  Vaping Use  . Vaping Use: Never used  Substance and Sexual Activity  . Alcohol use: Yes    Alcohol/week: 0.0 standard drinks    Comment: rare  .  Drug use: No  . Sexual activity: Yes    Birth control/protection: None  Other Topics Concern  . Not on file  Social History Narrative  . Not on file   Social Determinants of Health   Financial Resource Strain:   . Difficulty of Paying Living Expenses:   Food Insecurity:   . Worried About Charity fundraiser in the Last Year:   . Arboriculturist in the Last Year:   Transportation Needs:   . Film/video editor (Medical):   Marland Kitchen Lack of Transportation  (Non-Medical):   Physical Activity:   . Days of Exercise per Week:   . Minutes of Exercise per Session:   Stress:   . Feeling of Stress :   Social Connections:   . Frequency of Communication with Friends and Family:   . Frequency of Social Gatherings with Friends and Family:   . Attends Religious Services:   . Active Member of Clubs or Organizations:   . Attends Archivist Meetings:   Marland Kitchen Marital Status:   Intimate Partner Violence:   . Fear of Current or Ex-Partner:   . Emotionally Abused:   Marland Kitchen Physically Abused:   . Sexually Abused:     Allergies:  No Known Allergies  Medications: Prior to Admission medications   Medication Sig Start Date End Date Taking? Authorizing Provider  acetaminophen (TYLENOL) 500 MG tablet Take 500 mg by mouth every 6 (six) hours as needed.   Yes [provider]  buPROPion (WELLBUTRIN XL) 300 MG 24 hr tablet TAKE 1 TABLET BY MOUTH  DAILY 09/10/19  Yes Leone Haven, MD  cetirizine (ZYRTEC) 10 MG tablet Take 10 mg by mouth every morning.    Yes [provider]  diphenhydrAMINE (BENADRYL) 25 MG tablet Take 25 mg by mouth at bedtime as needed for sleep.   Yes [provider]  ibuprofen (ADVIL,MOTRIN) 600 MG tablet Take 1 tablet (600 mg total) by mouth every 6 (six) hours as needed for mild pain, moderate pain or cramping. 11/18/17  Yes Malachy Mood, MD  omeprazole (PRILOSEC) 20 MG capsule TAKE 1 CAPSULE BY MOUTH  DAILY 03/17/20  Yes Leone Haven, MD  Prenatal Vit-Fe Fumarate-FA (PRENATAL MULTIVITAMIN) TABS tablet Take 1 tablet by mouth daily.    Yes [provider]  sertraline (ZOLOFT) 100 MG tablet TAKE 1 TABLET BY MOUTH  DAILY 06/21/19  Yes Leone Haven, MD  sodium chloride (OCEAN) 0.65 % SOLN nasal spray Place 1 spray into both nostrils as needed for congestion.   Yes [provider]    Physical Exam Vitals: Blood pressure 114/70, height 5\' 7"  (1.702 m), weight 228 lb (103.4 kg),  last menstrual period 03/02/2020.  General: NAD HEENT: normocephalic, anicteric Thyroid: no enlargement, no palpable nodules Pulmonary: No increased work of breathing, CTAB Cardiovascular: RRR, distal pulses 2+ Breast: Breast symmetrical, no tenderness, no palpable nodules or masses, no skin or nipple retraction present, no nipple discharge.  No axillary or supraclavicular lymphadenopathy. Abdomen: NABS, soft, non-tender, non-distended.  Umbilicus without lesions.  No hepatomegaly, splenomegaly or masses palpable. No evidence of hernia  Genitourinary:  External: Normal external female genitalia.  Normal urethral meatus, normal Bartholin's and Skene's glands.  There is some mild hypopigmentation noted on the left anterior labia minora.  Normal architecture is preserved  Vagina: Normal vaginal mucosa, no evidence of prolapse.    Cervix: Grossly normal in appearance, no bleeding  Uterus: Non-enlarged, mobile, normal contour.  No CMT  Adnexa: ovaries non-enlarged, no adnexal masses  Rectal: deferred  Lymphatic: no evidence of inguinal lymphadenopathy Extremities: no edema, erythema, or tenderness Neurologic: Grossly intact Psychiatric: mood appropriate, affect full  Female chaperone present for pelvic and breast  portions of the physical exam    Assessment: 39 y.o. [id number] routine annual exam  Plan: Problem List Items Addressed This Visit    None    Visit Diagnoses    Encounter for gynecological examination without abnormal finding    -  Primary   Screening for malignant neoplasm of cervix       Relevant Orders   Cytology - PAP (Completed)   Breast screening       Lichen sclerosus          1) STI screening  was notoffered and therefore not obtained  2)  ASCCP guidelines and rational discussed.  Patient opts for every 3 years screening interval  3) Contraception - the patient is currently using  vasectomy.  She is happy with her current form of contraception and plans to  continue  4) Routine healthcare maintenance including cholesterol, diabetes screening discussed managed by PCP  5) Lichen Sclerosus - refill of clobetasol cream  6) Return in about 1 year (around 03/17/2021) for annual.   Malachy Mood, MD, [place], [facility] 03/17/2020, 10:43 AM

## 2020-03-20 LAB — CYTOLOGY - PAP
Comment: NEGATIVE
Diagnosis: NEGATIVE
High risk HPV: NEGATIVE

## 2020-05-04 ENCOUNTER — Encounter: Payer: Self-pay | Admitting: Family Medicine

## 2020-05-05 MED ORDER — HYDROXYZINE HCL 10 MG PO TABS: 10.0000 mg | ORAL_TABLET | Freq: Three times a day (TID) | ORAL | 0 refills | Status: DC | PRN

## 2020-05-16 ENCOUNTER — Encounter: Payer: Self-pay | Admitting: Family Medicine

## 2020-05-16 ENCOUNTER — Ambulatory Visit: Payer: Managed Care, Other (non HMO) | Admitting: Family Medicine

## 2020-05-16 ENCOUNTER — Other Ambulatory Visit: Payer: Self-pay

## 2020-05-16 DIAGNOSIS — F419 Anxiety disorder, unspecified: Secondary | ICD-10-CM | POA: Diagnosis not present

## 2020-05-16 DIAGNOSIS — G43009 Migraine without aura, not intractable, without status migrainosus: Secondary | ICD-10-CM

## 2020-05-16 DIAGNOSIS — F32A Depression, unspecified: Secondary | ICD-10-CM

## 2020-05-16 DIAGNOSIS — F329 Major depressive disorder, single episode, unspecified: Secondary | ICD-10-CM

## 2020-05-16 MED ORDER — ELETRIPTAN HYDROBROMIDE 20 MG PO TABS: 20.0000 mg | ORAL_TABLET | ORAL | 0 refills | Status: DC | PRN

## 2020-05-16 NOTE — Assessment & Plan Note (Signed)
Typically only occurring during menses that she has had 2 episodes of migraines at other times it seemed to be more significant than her typical migraines.  We will provide her with Relpax to take on an as-needed basis.  Advised not to get pregnant while taking this.

## 2020-05-16 NOTE — Patient Instructions (Signed)
Nice to see you. You can try the Relpax when you have a migraine.  Please take it as soon as you feel as though you are going to have a migraine.  Please do not get pregnant while taking this or take it while you are pregnant. You can continue the Atarax for your anxiety.

## 2020-05-16 NOTE — Assessment & Plan Note (Addendum)
Anxiety has been up with her home situation.  She is providing care to everyone in her home currently as her husband has been dealing with a significant injury.  She will continue with Wellbutrin and Zoloft.  She can continue Atarax.  She will see her therapist as planned next month.

## 2020-05-16 NOTE — Progress Notes (Signed)
Tommi Rumps, MD Phone: [phone]  Melinda Cortez is a 39 y.o. female who presents today for f/u.  Anxiety/depression: Patient notes quite a bit of anxiety recently.  Her husband was in a skydiving accident and broke multiple bones in his legs.  He has undergone several surgeries and is not walking currently.  She notes no significant depression with this.  Currently on Wellbutrin and Zoloft.  No SI.  She is been taking Atarax as needed to help with anxiety which has been quite helpful.  No drowsiness.  Migraines: Patient notes she typically only gets her migraines during her cycle though on 2 occasions over the last year she has had migraine headaches at other times.  One occurred in October 2020.  The other occurred in July 2021.  Symptoms lasted for about a day.  Her whole head would hurt with this.  She would feel nauseous and have some vomiting with it.  She had photophobia and phonophobia.  No weakness, numbness, or vision changes.  Over-the-counter medicines were not helpful for this.  Social History   Tobacco Use  Smoking Status Former Smoker  . Packs/day: 0.25  . Years: 16.00  . Pack years: 4.00  . Types: Cigarettes  . Quit date: 01/07/2015  . Years since quitting: 5.3  Smokeless Tobacco Never Used     ROS see history of present illness  Objective  Physical Exam Vitals:   05/16/20 1009 05/16/20 1041  BP: 134/90 126/82  Pulse: 73   Temp: 98.7 F (37.1 C)   SpO2: 98%     BP Readings from Last 3 Encounters:  05/16/20 126/82  03/17/20 114/70  05/17/19 120/80   Wt Readings from Last 3 Encounters:  05/16/20 241 lb 6.4 oz (109.5 kg)  03/17/20 228 lb (103.4 kg)  11/19/19 248 lb (112.5 kg)    Physical Exam Constitutional:      General: She is not in acute distress.    Appearance: She is not diaphoretic.  Cardiovascular:     Rate and Rhythm: Normal rate and regular rhythm.     Heart sounds: Normal heart sounds.  Pulmonary:     Effort: Pulmonary effort  is normal.     Breath sounds: Normal breath sounds.  Skin:    General: Skin is warm and dry.  Neurological:     Mental Status: She is alert.     Comments: Facial sensation intact bilaterally, hearing intact to finger rub bilaterally, EOMI, PERRL, able to close and open eyes appropriately, shoulder shrug intact, 5/5 strength in bilateral biceps, triceps, grip, quads, hamstrings, plantar and dorsiflexion, sensation to light touch intact in bilateral UE and LE, normal gait, 2+ patellar reflexes      Assessment/Plan: Please see individual problem list.  Migraine Typically only occurring during menses that she has had 2 episodes of migraines at other times it seemed to be more significant than her typical migraines.  We will provide her with Relpax to take on an as-needed basis.  Advised not to get pregnant while taking this.  Anxiety and depression Anxiety has been up with her home situation.  She is providing care to everyone in her home currently as her husband has been dealing with a significant injury.  She will continue with Wellbutrin and Zoloft.  She can continue Atarax.  She will see her therapist as planned next month.    No orders of the defined types were placed in this encounter.   Meds ordered this encounter  Medications  .  eletriptan (RELPAX) 20 MG tablet    Sig: Take 1 tablet (20 mg total) by mouth as needed for migraine or headache. May repeat in 2 hours if headache persists or recurs.    Dispense:  10 tablet    Refill:  0    This visit occurred during the SARS-CoV-2 public health emergency.  Safety protocols were in place, including screening questions prior to the visit, additional usage of staff PPE, and extensive cleaning of exam room while observing appropriate contact time as indicated for disinfecting solutions.    Tommi Rumps, MD [place]

## 2020-05-18 ENCOUNTER — Other Ambulatory Visit: Payer: Self-pay | Admitting: Family Medicine

## 2020-06-10 ENCOUNTER — Other Ambulatory Visit: Payer: Self-pay | Admitting: Family Medicine

## 2020-06-14 ENCOUNTER — Ambulatory Visit (INDEPENDENT_AMBULATORY_CARE_PROVIDER_SITE_OTHER): Payer: 59 | Admitting: Psychology

## 2020-06-14 DIAGNOSIS — F4323 Adjustment disorder with mixed anxiety and depressed mood: Secondary | ICD-10-CM | POA: Diagnosis not present

## 2020-07-02 ENCOUNTER — Other Ambulatory Visit: Payer: Self-pay | Admitting: Family Medicine

## 2020-07-26 ENCOUNTER — Ambulatory Visit (INDEPENDENT_AMBULATORY_CARE_PROVIDER_SITE_OTHER): Payer: 59 | Admitting: Psychology

## 2020-07-26 DIAGNOSIS — F4323 Adjustment disorder with mixed anxiety and depressed mood: Secondary | ICD-10-CM | POA: Diagnosis not present

## 2020-08-16 ENCOUNTER — Other Ambulatory Visit: Payer: Self-pay

## 2020-08-16 ENCOUNTER — Encounter: Payer: Self-pay | Admitting: Family Medicine

## 2020-08-16 ENCOUNTER — Other Ambulatory Visit: Payer: Self-pay | Admitting: Family Medicine

## 2020-08-16 ENCOUNTER — Ambulatory Visit: Payer: Managed Care, Other (non HMO) | Admitting: Family Medicine

## 2020-08-16 VITALS — BP 118/78 | HR 80 | Temp 97.7°F | Ht 67.0 in | Wt 256.0 lb

## 2020-08-16 DIAGNOSIS — F419 Anxiety disorder, unspecified: Secondary | ICD-10-CM | POA: Diagnosis not present

## 2020-08-16 DIAGNOSIS — F32A Depression, unspecified: Secondary | ICD-10-CM | POA: Diagnosis not present

## 2020-08-16 DIAGNOSIS — Z23 Encounter for immunization: Secondary | ICD-10-CM

## 2020-08-16 DIAGNOSIS — G43009 Migraine without aura, not intractable, without status migrainosus: Secondary | ICD-10-CM | POA: Diagnosis not present

## 2020-08-16 NOTE — Progress Notes (Signed)
  Tommi Rumps, MD Phone: [phone]  TAYLIA Cortez is a 39 y.o. female who presents today for follow-up.  Anxiety/depression: Patient has no depression.  Anxiety is well controlled.  She occasionally has a day where her anxiety increases and she has to take hydroxyzine.  She remains on Zoloft and Wellbutrin.  She notes she is getting a break from providing care to her child and husband in a couple of weekends and is looking forward to that.  Migraines: Patient has not had any significant migraine since I last saw her.  She has not had to use the Relpax.  No vision changes, numbness, or weakness.  Social History   Tobacco Use  Smoking Status Former Smoker  . Packs/day: 0.25  . Years: 16.00  . Pack years: 4.00  . Types: Cigarettes  . Quit date: 01/07/2015  . Years since quitting: 5.6  Smokeless Tobacco Never Used     ROS see history of present illness  Objective  Physical Exam Vitals:   08/16/20 1040  BP: 118/78  Pulse: 80  Temp: 97.7 F (36.5 C)  SpO2: 99%    BP Readings from Last 3 Encounters:  08/16/20 118/78  05/16/20 126/82  03/17/20 114/70   Wt Readings from Last 3 Encounters:  08/16/20 256 lb (116.1 kg)  05/16/20 241 lb 6.4 oz (109.5 kg)  03/17/20 228 lb (103.4 kg)    Physical Exam Constitutional:      General: She is not in acute distress.    Appearance: She is not diaphoretic.  Cardiovascular:     Rate and Rhythm: Normal rate and regular rhythm.     Heart sounds: Normal heart sounds.  Pulmonary:     Effort: Pulmonary effort is normal.     Breath sounds: Normal breath sounds.  Skin:    General: Skin is warm and dry.  Neurological:     Mental Status: She is alert.      Assessment/Plan: Please see individual problem list.  Problem List Items Addressed This Visit    Anxiety and depression    Well-controlled.  She will continue Wellbutrin 300 mg once daily and Zoloft 100 mg once daily.  She can continue the hydroxyzine 10 mg 3 times  daily as needed for anxiety.  She monitor for drowsiness with that medication.      Migraine    No significant symptoms.  She can use the Relpax on an as-needed basis as prescribed.       Other Visit Diagnoses    Need for immunization against influenza    -  Primary   Relevant Orders   Flu Vaccine QUAD 36+ mos IM (Completed)       Health Maintenance: Encouraged the patient to get the COVID-19 booster.   This visit occurred during the SARS-CoV-2 public health emergency.  Safety protocols were in place, including screening questions prior to the visit, additional usage of staff PPE, and extensive cleaning of exam room while observing appropriate contact time as indicated for disinfecting solutions.    Tommi Rumps, MD [place]

## 2020-08-16 NOTE — Patient Instructions (Signed)
Nice to see you. Please use the Relpax as prescribed if you need it. Please continue on Zoloft and Wellbutrin.  You can use hydroxyzine as needed as prescribed.  If this makes you drowsy please let us know.

## 2020-08-16 NOTE — Assessment & Plan Note (Signed)
No significant symptoms.  She can use the Relpax on an as-needed basis as prescribed.

## 2020-08-16 NOTE — Assessment & Plan Note (Signed)
Well-controlled.  She will continue Wellbutrin 300 mg once daily and Zoloft 100 mg once daily.  She can continue the hydroxyzine 10 mg 3 times daily as needed for anxiety.  She monitor for drowsiness with that medication.

## 2020-09-26 ENCOUNTER — Encounter: Payer: Self-pay | Admitting: Family Medicine

## 2020-09-27 NOTE — Telephone Encounter (Signed)
Notification sent to health Dept.

## 2020-10-04 ENCOUNTER — Ambulatory Visit (INDEPENDENT_AMBULATORY_CARE_PROVIDER_SITE_OTHER): Payer: 59 | Admitting: Psychology

## 2020-10-04 DIAGNOSIS — F4323 Adjustment disorder with mixed anxiety and depressed mood: Secondary | ICD-10-CM | POA: Diagnosis not present

## 2020-12-06 ENCOUNTER — Ambulatory Visit (INDEPENDENT_AMBULATORY_CARE_PROVIDER_SITE_OTHER): Payer: 59 | Admitting: Psychology

## 2020-12-06 DIAGNOSIS — F4323 Adjustment disorder with mixed anxiety and depressed mood: Secondary | ICD-10-CM

## 2021-01-17 ENCOUNTER — Encounter: Payer: Self-pay | Admitting: Family Medicine

## 2021-01-18 NOTE — Telephone Encounter (Signed)
It should be fine for a same day slot though if her symptoms worsen she needs to be seen sooner in person.

## 2021-01-19 ENCOUNTER — Ambulatory Visit: Payer: Managed Care, Other (non HMO) | Admitting: Family Medicine

## 2021-01-19 ENCOUNTER — Other Ambulatory Visit: Payer: Self-pay

## 2021-01-19 ENCOUNTER — Encounter: Payer: Self-pay | Admitting: Family Medicine

## 2021-01-19 VITALS — BP 120/80 | HR 80 | Temp 96.4°F | Ht 67.0 in | Wt 247.0 lb

## 2021-01-19 DIAGNOSIS — R112 Nausea with vomiting, unspecified: Secondary | ICD-10-CM | POA: Diagnosis not present

## 2021-01-19 DIAGNOSIS — R1013 Epigastric pain: Secondary | ICD-10-CM | POA: Diagnosis not present

## 2021-01-19 DIAGNOSIS — J392 Other diseases of pharynx: Secondary | ICD-10-CM

## 2021-01-19 DIAGNOSIS — R1110 Vomiting, unspecified: Secondary | ICD-10-CM | POA: Insufficient documentation

## 2021-01-19 NOTE — Assessment & Plan Note (Addendum)
The patient likely had a viral gastroenteritis.  Her vomiting has improved at this time.  She does still occasionally have a squeezing discomfort in her epigastric and right upper quadrant area.  We will check lab work as outlined for that.  She will monitor for recurrent symptoms.  I suspect she will feel back to her baseline in the next several days.  She will remain hydrated.

## 2021-01-19 NOTE — Assessment & Plan Note (Signed)
Refer to GI to consider further evaluation.

## 2021-01-19 NOTE — Progress Notes (Signed)
Melinda Rumps, MD Phone: [phone]  Melinda Cortez is a 40 y.o. female who presents today for same-day visit.  Vomiting/stomach issues: Patient notes onset of symptoms on Sunday.  She had nausea and vomiting.  She had more frequent bowel movements though no diarrhea.  Vomiting was nonbloody nonbilious.  She has since then had some squeezing discomfort in her epigastric and right upper quadrant area.  It comes and goes.  She notes the vomiting resolved on Wednesday morning.  She said no fevers.  No blood in her stool.  No sick contacts.  She does note her gag reflex has been much stronger since she had her child.  She had lots of vomiting when she was pregnant.  She is currently on her menstrual cycle.  She reports her urine is clear and she is staying hydrated.  Social History   Tobacco Use  Smoking Status Former Smoker  . Packs/day: 0.25  . Years: 16.00  . Pack years: 4.00  . Types: Cigarettes  . Quit date: 01/07/2015  . Years since quitting: 6.0  Smokeless Tobacco Never Used    Current Outpatient Medications on File Prior to Visit  Medication Sig Dispense Refill  . acetaminophen (TYLENOL) 500 MG tablet Take 500 mg by mouth every 6 (six) hours as needed.    Marland Kitchen buPROPion (WELLBUTRIN XL) 300 MG 24 hr tablet TAKE 1 TABLET BY MOUTH  DAILY 90 tablet 3  . cetirizine (ZYRTEC) 10 MG tablet Take 10 mg by mouth every morning.     . clobetasol cream (TEMOVATE) 2.37 % Apply 1 application topically 2 (two) times daily. 60 each 1  . diphenhydrAMINE (BENADRYL) 25 MG tablet Take 25 mg by mouth at bedtime as needed for sleep.    Marland Kitchen eletriptan (RELPAX) 20 MG tablet TAKE 1 TABLET BY MOUTH AS NEEDED FOR MIGRAINE OR HEADACHE. MAY REPEAT IN 2 HOURS IF HEADACHE PERSISTS OR RECURS. 9 tablet 1  . hydrOXYzine (ATARAX/VISTARIL) 10 MG tablet Take 1 tablet (10 mg total) by mouth 3 (three) times daily as needed for anxiety. 30 tablet 0  . ibuprofen (ADVIL,MOTRIN) 600 MG tablet Take 1 tablet (600 mg total) by  mouth every 6 (six) hours as needed for mild pain, moderate pain or cramping. 30 tablet 0  . omeprazole (PRILOSEC) 20 MG capsule TAKE 1 CAPSULE BY MOUTH  DAILY 90 capsule 1  . Prenatal Vit-Fe Fumarate-FA (PRENATAL MULTIVITAMIN) TABS tablet Take 1 tablet by mouth daily.     . sertraline (ZOLOFT) 100 MG tablet TAKE 1 TABLET BY MOUTH  DAILY 90 tablet 3  . sodium chloride (OCEAN) 0.65 % SOLN nasal spray Place 1 spray into both nostrils as needed for congestion.     No current facility-administered medications on file prior to visit.     ROS see history of present illness  Objective  Physical Exam Vitals:   01/19/21 1520  BP: 120/80  Pulse: 80  Temp: (!) 96.4 F (35.8 C)  SpO2: 98%    BP Readings from Last 3 Encounters:  01/19/21 120/80  08/16/20 118/78  05/16/20 126/82   Wt Readings from Last 3 Encounters:  01/19/21 247 lb (112 kg)  08/16/20 256 lb (116.1 kg)  05/16/20 241 lb 6.4 oz (109.5 kg)    Physical Exam Constitutional:      General: She is not in acute distress.    Appearance: She is not diaphoretic.  Cardiovascular:     Rate and Rhythm: Normal rate and regular rhythm.     Heart  sounds: Normal heart sounds.  Pulmonary:     Effort: Pulmonary effort is normal.     Breath sounds: Normal breath sounds.  Abdominal:     General: Bowel sounds are normal. There is no distension.     Palpations: Abdomen is soft.     Tenderness: There is no abdominal tenderness. There is no guarding or rebound.  Skin:    General: Skin is warm and dry.  Neurological:     Mental Status: She is alert.      Assessment/Plan: Please see individual problem list.  Problem List Items Addressed This Visit    Hyperactive gag reflex - Primary    Refer to GI to consider further evaluation.      Relevant Orders   Ambulatory referral to Gastroenterology   Vomiting    The patient likely had a viral gastroenteritis.  Her vomiting has improved at this time.  She does still occasionally have  a squeezing discomfort in her epigastric and right upper quadrant area.  We will check lab work as outlined for that.  She will monitor for recurrent symptoms.  I suspect she will feel back to her baseline in the next several days.  She will remain hydrated.       Other Visit Diagnoses    Epigastric pain       Relevant Orders   CBC w/Diff   Comp Met (CMET)   Lipase      This visit occurred during the SARS-CoV-2 public health emergency.  Safety protocols were in place, including screening questions prior to the visit, additional usage of staff PPE, and extensive cleaning of exam room while observing appropriate contact time as indicated for disinfecting solutions.    Melinda Rumps, MD [place]

## 2021-01-19 NOTE — Patient Instructions (Signed)
Nice to see you. We will get labs today. I will place a referral to GI. If you start to vomit again please let us know. If you have worsening abdominal pain please seek medical attention immediately.

## 2021-01-20 LAB — CBC WITH DIFFERENTIAL/PLATELET
Basophils Absolute: 0 10*3/uL (ref 0.0–0.2)
Basos: 1 %
EOS (ABSOLUTE): 0.1 10*3/uL (ref 0.0–0.4)
Eos: 1 %
Hematocrit: 38.8 % (ref 34.0–46.6)
Hemoglobin: 13 g/dL (ref 11.1–15.9)
Immature Grans (Abs): 0 10*3/uL (ref 0.0–0.1)
Immature Granulocytes: 0 %
Lymphocytes Absolute: 1.5 10*3/uL (ref 0.7–3.1)
Lymphs: 28 %
MCH: 29.9 pg (ref 26.6–33.0)
MCHC: 33.5 g/dL (ref 31.5–35.7)
MCV: 89 fL (ref 79–97)
Monocytes Absolute: 0.4 10*3/uL (ref 0.1–0.9)
Monocytes: 6 %
Neutrophils Absolute: 3.5 10*3/uL (ref 1.4–7.0)
Neutrophils: 64 %
Platelets: 207 10*3/uL (ref 150–450)
RBC: 4.35 x10E6/uL (ref 3.77–5.28)
RDW: 12.8 % (ref 11.7–15.4)
WBC: 5.4 10*3/uL (ref 3.4–10.8)

## 2021-01-20 LAB — COMPREHENSIVE METABOLIC PANEL
ALT: 20 IU/L (ref 0–32)
AST: 23 IU/L (ref 0–40)
Albumin/Globulin Ratio: 2 (ref 1.2–2.2)
Albumin: 4.3 g/dL (ref 3.8–4.8)
Alkaline Phosphatase: 68 IU/L (ref 44–121)
BUN/Creatinine Ratio: 16 (ref 9–23)
BUN: 11 mg/dL (ref 6–20)
Bilirubin Total: 0.2 mg/dL (ref 0.0–1.2)
CO2: 22 mmol/L (ref 20–29)
Calcium: 9.2 mg/dL (ref 8.7–10.2)
Chloride: 104 mmol/L (ref 96–106)
Creatinine, Ser: 0.69 mg/dL (ref 0.57–1.00)
Globulin, Total: 2.2 g/dL (ref 1.5–4.5)
Glucose: 106 mg/dL — ABNORMAL HIGH (ref 65–99)
Potassium: 3.3 mmol/L — ABNORMAL LOW (ref 3.5–5.2)
Sodium: 143 mmol/L (ref 134–144)
Total Protein: 6.5 g/dL (ref 6.0–8.5)
eGFR: 113 mL/min/{1.73_m2} (ref >59)

## 2021-01-20 LAB — LIPASE: Lipase: 39 U/L (ref 14–72)

## 2021-01-25 ENCOUNTER — Encounter: Payer: Self-pay | Admitting: Gastroenterology

## 2021-01-26 ENCOUNTER — Other Ambulatory Visit: Payer: Self-pay | Admitting: Family Medicine

## 2021-01-30 ENCOUNTER — Emergency Department
Admission: EM | Admit: 2021-01-30 | Discharge: 2021-01-30 | Disposition: A | Discharge status: Home / Self Care | Payer: Managed Care, Other (non HMO) | Attending: Emergency Medicine | Admitting: Emergency Medicine

## 2021-01-30 ENCOUNTER — Other Ambulatory Visit: Payer: Self-pay

## 2021-01-30 DIAGNOSIS — G43909 Migraine, unspecified, not intractable, without status migrainosus: Secondary | ICD-10-CM | POA: Insufficient documentation

## 2021-01-30 DIAGNOSIS — R519 Headache, unspecified: Secondary | ICD-10-CM | POA: Diagnosis present

## 2021-01-30 DIAGNOSIS — Z87891 Personal history of nicotine dependence: Secondary | ICD-10-CM | POA: Diagnosis not present

## 2021-01-30 MED ORDER — DIPHENHYDRAMINE HCL 50 MG/ML IJ SOLN
25.0000 mg | Freq: Once | INTRAMUSCULAR | Status: AC
  Administered 2021-01-30: 25 mg via INTRAVENOUS
  Filled 2021-01-30: qty 1

## 2021-01-30 MED ORDER — PROCHLORPERAZINE EDISYLATE 10 MG/2ML IJ SOLN
10.0000 mg | Freq: Once | INTRAMUSCULAR | Status: AC
  Administered 2021-01-30: 10 mg via INTRAVENOUS
  Filled 2021-01-30: qty 2

## 2021-01-30 MED ORDER — PROCHLORPERAZINE MALEATE 10 MG PO TABS: 10.0000 mg | ORAL_TABLET | Freq: Four times a day (QID) | ORAL | 0 refills | Status: DC | PRN

## 2021-01-30 NOTE — ED Triage Notes (Signed)
Pt arrives via POV with c/o migraine that she woke up with this morning at 8 am. States she has had migraines on/off for several years, follows with PCP who prescribed her relpax - she took that this morning with no relief. She notes associated n/v and light sensitivity. No recent head injury or trauma.

## 2021-01-30 NOTE — ED Provider Notes (Signed)
[facility] Emergency Department Provider Note   ____________________________________________   Event Date/Time   First MD Initiated Contact with Patient 01/30/21 1116     (approximate)  I have reviewed the triage vital signs and the nursing notes.   HISTORY  Chief Complaint Migraine    HPI Melinda Cortez is a 40 y.o. female with past medical history of GERD, migraines, and anxiety who presents to the ED complaining of headache.  Patient reports that she woke up with a headache around 8 AM this morning.  Pain is described as a throbbing and squeezing that initially affected the left side of her face and has since spread around to the right side of her head and the back of her head.  Pain has been gradually worsening and is exacerbated by bright lights as well as loud noises.  She has felt nauseous and has had a couple episodes of vomiting.  She denies any fevers or neck stiffness.  She denies any numbness or weakness in her extremities, has not had any changes in her vision or speech.  She describes current symptoms as similar to prior migraine headaches, she took a dose of Relpax earlier this morning without significant relief.        Past Medical History:  Diagnosis Date  . Allergic rhinitis   . Anxiety   . Anxiety   . Chronic headaches    HORMONAL   . Complication of anesthesia   . Depression   . Depression   . Elevated blood pressure   . Elevated blood pressure affecting pregnancy in third trimester, antepartum 10/16/2017  . Gestational hypertension 11/15/2017  . PONV (postoperative nausea and vomiting)     Patient Active Problem List   Diagnosis Date Noted  . 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  . Vomiting 01/19/2021  . GERD (gastroesophageal reflux disease) 05/17/2019  . Late period 05/12/2018  . Obesity (BMI 35.0-39.9 without comorbidity) 09/09/2017  . Migraine 03/04/2017  . Elevated BP without diagnosis of hypertension 01/14/2017  .  Allergic rhinitis 09/12/2016  . Anxiety and depression 01/30/2016    Past Surgical History:  Procedure Laterality Date  . DILATION AND CURETTAGE OF UTERUS    . DILATION AND EVACUATION N/A 01/07/2017   Procedure: DILATATION AND EVACUATION;  Surgeon: Malachy Mood, MD;  Location: [facility] ORS;  Service: Gynecology;  Laterality: N/A;  . TONSILLECTOMY AND ADENOIDECTOMY  1994    Prior to Admission medications   Medication Sig Start Date End Date Taking? Authorizing Provider  prochlorperazine (COMPAZINE) 10 MG tablet Take 1 tablet (10 mg total) by mouth every 6 (six) hours as needed for nausea or vomiting. 01/30/21  Yes Blake Divine, MD  acetaminophen (TYLENOL) 500 MG tablet Take 500 mg by mouth every 6 (six) hours as needed.    [provider]  buPROPion (WELLBUTRIN XL) 300 MG 24 hr tablet TAKE 1 TABLET BY MOUTH  DAILY 07/03/20   Leone Haven, MD  cetirizine (ZYRTEC) 10 MG tablet Take 10 mg by mouth every morning.     [provider]  clobetasol cream (TEMOVATE) 3.32 % Apply 1 application topically 2 (two) times daily. 03/17/20   Malachy Mood, MD  diphenhydrAMINE (BENADRYL) 25 MG tablet Take 25 mg by mouth at bedtime as needed for sleep.    [provider]  eletriptan (RELPAX) 20 MG tablet TAKE 1 TABLET BY MOUTH AS NEEDED FOR MIGRAINE OR HEADACHE. MAY REPEAT IN 2 HOURS IF HEADACHE PERSISTS OR RECURS. 01/29/21   Caryl Bis,  Angela Adam, MD  hydrOXYzine (ATARAX/VISTARIL) 10 MG tablet Take 1 tablet (10 mg total) by mouth 3 (three) times daily as needed for anxiety. 05/05/20   Leone Haven, MD  ibuprofen (ADVIL,MOTRIN) 600 MG tablet Take 1 tablet (600 mg total) by mouth every 6 (six) hours as needed for mild pain, moderate pain or cramping. 11/18/17   Malachy Mood, MD  omeprazole (PRILOSEC) 20 MG capsule TAKE 1 CAPSULE BY MOUTH  DAILY 08/16/20   Leone Haven, MD  Prenatal Vit-Fe Fumarate-FA (PRENATAL MULTIVITAMIN) TABS tablet Take 1 tablet by mouth daily.      [provider]  sertraline (ZOLOFT) 100 MG tablet TAKE 1 TABLET BY MOUTH  DAILY 05/18/20   Leone Haven, MD  sodium chloride (OCEAN) 0.65 % SOLN nasal spray Place 1 spray into both nostrils as needed for congestion.    [provider]    Allergies Patient has no known allergies.  Family History  Problem Relation Age of Onset  . Alcoholism Other   . Breast cancer Maternal Aunt 67       Mothers aunt  . Lung cancer Other   . Hypertension Father   . Heart disease Father   . Hypertension Brother   . Hypertension Brother        prediabetes  . Lymphoma Paternal Grandfather   . Schizophrenia Maternal Uncle     Social History Social History   Tobacco Use  . Smoking status: Former Smoker    Packs/day: 0.25    Years: 16.00    Pack years: 4.00    Types: Cigarettes    Quit date: 01/07/2015    Years since quitting: 6.0  . Smokeless tobacco: Never Used  Vaping Use  . Vaping Use: Never used  Substance Use Topics  . Alcohol use: Yes    Alcohol/week: 0.0 standard drinks    Comment: rare  . Drug use: No    Review of Systems  Constitutional: No fever/chills Eyes: No visual changes. ENT: No sore throat. Cardiovascular: Denies chest pain. Respiratory: Denies shortness of breath. Gastrointestinal: No abdominal pain.  Positive for nausea and vomiting.  No diarrhea.  No constipation. Genitourinary: Negative for dysuria. Musculoskeletal: Negative for back pain. Skin: Negative for rash. Neurological: Positive for headaches, negative for focal weakness or numbness.  ____________________________________________   PHYSICAL EXAM:  VITAL SIGNS: ED Triage Vitals  Enc Vitals Group     BP 01/30/21 1119 (!) 153/86     Pulse Rate 01/30/21 1119 67     Resp 01/30/21 1119 15     Temp 01/30/21 1119 97.6 F (36.4 C)     Temp Source 01/30/21 1119 Axillary     SpO2 01/30/21 1119 99 %     Weight 01/30/21 1120 245 lb (111.1 kg)     Height 01/30/21 1120 5\' 6"  (1.676  m)     Head Circumference --      Peak Flow --      Pain Score 01/30/21 1120 8     Pain Loc --      Pain Edu? --      Excl. in [place]? --     Constitutional: Alert and oriented. Eyes: Conjunctivae are normal.  Pupils equal round and reactive to light bilaterally. Head: Atraumatic. Nose: No congestion/rhinnorhea. Mouth/Throat: Mucous membranes are moist. Neck: Normal ROM Cardiovascular: Normal rate, regular rhythm. Grossly normal heart sounds. Respiratory: Normal respiratory effort.  No retractions. Lungs CTAB. Gastrointestinal: Soft and nontender. No distention. Genitourinary: deferred Musculoskeletal: No lower  extremity tenderness nor edema. Neurologic:  Normal speech and language. No gross focal neurologic deficits are appreciated. Skin:  Skin is warm, dry and intact. No rash noted. Psychiatric: Mood and affect are normal. Speech and behavior are normal.  ____________________________________________   LABS (all labs ordered are listed, but only abnormal results are displayed)  Labs Reviewed - No data to display   PROCEDURES  Procedure(s) performed (including Critical Care):  Procedures   ____________________________________________   INITIAL IMPRESSION / ASSESSMENT AND PLAN / ED COURSE       40 year old female with past medical history of GERD, migraines, and anxiety presents to the ED with gradually worsening headache since waking this morning that is associated with nausea and vomiting, worse with bright lights.  Symptoms sound consistent with migraine headache, I doubt SAH or meningitis at this time.  We will treat with Compazine and Benadryl, reassess.  No indication for CT imaging of head at this time.  Patient reports symptoms are much improved following migraine cocktail, she has not had no further vomiting here in the ED.  She is appropriate for discharge home and will be provided with referral to neurology for her recurrent migraines.  She was counseled to  return to the ED for new worsening symptoms, patient agrees with plan.      ____________________________________________   FINAL CLINICAL IMPRESSION(S) / ED DIAGNOSES  Final diagnoses:  Migraine without status migrainosus, not intractable, unspecified migraine type     ED Discharge Orders         Ordered    prochlorperazine (COMPAZINE) 10 MG tablet  Every 6 hours PRN        01/30/21 1257           Note:  This document was prepared using Dragon voice recognition software and may include unintentional dictation errors.   Blake Divine, MD 01/30/21 1258

## 2021-01-31 ENCOUNTER — Encounter: Payer: Self-pay | Admitting: Family Medicine

## 2021-01-31 DIAGNOSIS — G43909 Migraine, unspecified, not intractable, without status migrainosus: Secondary | ICD-10-CM

## 2021-02-01 ENCOUNTER — Encounter: Payer: Self-pay | Admitting: Neurology

## 2021-02-14 ENCOUNTER — Other Ambulatory Visit: Payer: Self-pay

## 2021-02-14 ENCOUNTER — Telehealth (INDEPENDENT_AMBULATORY_CARE_PROVIDER_SITE_OTHER): Payer: Managed Care, Other (non HMO) | Admitting: Family Medicine

## 2021-02-14 DIAGNOSIS — G43009 Migraine without aura, not intractable, without status migrainosus: Secondary | ICD-10-CM | POA: Diagnosis not present

## 2021-02-14 MED ORDER — PROPRANOLOL HCL 20 MG PO TABS
20.0000 mg | ORAL_TABLET | Freq: Two times a day (BID) | ORAL | 1 refills | Status: DC
Start: 2021-02-14 — End: 2021-04-12

## 2021-02-14 NOTE — Progress Notes (Signed)
Virtual Visit via telephone Note  This visit type was conducted due to national recommendations for restrictions regarding the COVID-19 pandemic (e.g. social distancing).  This format is felt to be most appropriate for this patient at this time.  All issues noted in this document were discussed and addressed.  No physical exam was performed (except for noted visual exam findings with Video Visits).   I connected with Melinda Cortez today at  9:30 AM EDT by telephone and verified that I am speaking with the correct person using two identifiers. Location patient: home Location provider: work Persons participating in the virtual visit: patient, provider  I discussed the limitations, risks, security and privacy concerns of performing an evaluation and management service by telephone and the availability of in person appointments. I also discussed with the patient that there may be a patient responsible charge related to this service. The patient expressed understanding and agreed to proceed.  Interactive audio and video telecommunications were attempted between this provider and patient, however failed, due to patient having technical difficulties OR patient did not have access to video capability.  We continued and completed visit with audio only.   Reason for visit: f/u  HPI: Chronic migraines: Patient notes having a bad migraine once a month.  She notes 4 out of 7 days/week she has a more mild migraine.  She notes that Relpax has not been helpful as much as it was previously.  She had to go to the emergency room for a bad migraine recently as she developed nausea and was unable to keep the Relpax down.  She was given Compazine and Benadryl and notes the IV Compazine contributed to a panic attack.  She notes that the headache improved after treatment in the ED though did last about a week.  She notes no aura.  She notes location is behind her left eye when she has headaches.  No numbness or  weakness or vision changes though does note photophobia.   ROS: See pertinent positives and negatives per HPI.  Past Medical History:  Diagnosis Date  . Allergic rhinitis   . Anxiety   . Anxiety   . Chronic headaches    HORMONAL   . Complication of anesthesia   . Depression   . Depression   . Elevated blood pressure   . Elevated blood pressure affecting pregnancy in third trimester, antepartum 10/16/2017  . Gestational hypertension 11/15/2017  . PONV (postoperative nausea and vomiting)     Past Surgical History:  Procedure Laterality Date  . DILATION AND CURETTAGE OF UTERUS    . DILATION AND EVACUATION N/A 01/07/2017   Procedure: DILATATION AND EVACUATION;  Surgeon: Malachy Mood, MD;  Location: [facility] ORS;  Service: Gynecology;  Laterality: N/A;  . TONSILLECTOMY AND ADENOIDECTOMY  1994    Family History  Problem Relation Age of Onset  . Alcoholism Other   . Breast cancer Maternal Aunt 18       Mothers aunt  . Lung cancer Other   . Hypertension Father   . Heart disease Father   . Hypertension Brother   . Hypertension Brother        prediabetes  . Lymphoma Paternal Grandfather   . Schizophrenia Maternal Uncle     SOCIAL HX: Former smoker   Current Outpatient Medications:  .  acetaminophen (TYLENOL) 500 MG tablet, Take 500 mg by mouth every 6 (six) hours as needed., Disp: , Rfl:  .  buPROPion (WELLBUTRIN XL) 300 MG 24 hr tablet, TAKE  1 TABLET BY MOUTH  DAILY, Disp: 90 tablet, Rfl: 3 .  cetirizine (ZYRTEC) 10 MG tablet, Take 10 mg by mouth every morning. , Disp: , Rfl:  .  clobetasol cream (TEMOVATE) 9.76 %, Apply 1 application topically 2 (two) times daily., Disp: 60 each, Rfl: 1 .  diphenhydrAMINE (BENADRYL) 25 MG tablet, Take 25 mg by mouth at bedtime as needed for sleep., Disp: , Rfl:  .  eletriptan (RELPAX) 20 MG tablet, TAKE 1 TABLET BY MOUTH AS NEEDED FOR MIGRAINE OR HEADACHE. MAY REPEAT IN 2 HOURS IF HEADACHE PERSISTS OR RECURS., Disp: 9 tablet, Rfl: 1 .   hydrOXYzine (ATARAX/VISTARIL) 10 MG tablet, Take 1 tablet (10 mg total) by mouth 3 (three) times daily as needed for anxiety., Disp: 30 tablet, Rfl: 0 .  ibuprofen (ADVIL,MOTRIN) 600 MG tablet, Take 1 tablet (600 mg total) by mouth every 6 (six) hours as needed for mild pain, moderate pain or cramping., Disp: 30 tablet, Rfl: 0 .  omeprazole (PRILOSEC) 20 MG capsule, TAKE 1 CAPSULE BY MOUTH  DAILY, Disp: 90 capsule, Rfl: 1 .  Prenatal Vit-Fe Fumarate-FA (PRENATAL MULTIVITAMIN) TABS tablet, Take 1 tablet by mouth daily. , Disp: , Rfl:  .  prochlorperazine (COMPAZINE) 10 MG tablet, Take 1 tablet (10 mg total) by mouth every 6 (six) hours as needed for nausea or vomiting., Disp: 12 tablet, Rfl: 0 .  propranolol (INDERAL) 20 MG tablet, Take 1 tablet (20 mg total) by mouth 2 (two) times daily., Disp: 60 tablet, Rfl: 1 .  sertraline (ZOLOFT) 100 MG tablet, TAKE 1 TABLET BY MOUTH  DAILY, Disp: 90 tablet, Rfl: 3 .  sodium chloride (OCEAN) 0.65 % SOLN nasal spray, Place 1 spray into both nostrils as needed for congestion., Disp: , Rfl:   EXAM: This was a telephone visit and thus no physical exam was completed.  ASSESSMENT AND PLAN:  Discussed the following assessment and plan:  Problem List Items Addressed This Visit    Migraine    This issue has worsened.  Relpax has not been as beneficial.  She is interested in prophylactic treatment.  Discussed the option of an SNRI though she does not want to change the Zoloft as this has been quite beneficial.  We opted for propranolol.  Discussed that she needs to monitor for fatigue, heart rates less than 60, blood pressure is less than 100/60, and lightheadedness.  If she develops any side effects she will let us know.  Discussed the goal of treatment would be to reduce the frequency of her migraines and hopefully the severity.  We will follow-up in 1 month.  She will see neurology as planned in July.      Relevant Medications   propranolol (INDERAL) 20 MG tablet       Return in about 1 month (around 03/17/2021) for Migraine, in office.   I discussed the assessment and treatment plan with the patient. The patient was provided an opportunity to ask questions and all were answered. The patient agreed with the plan and demonstrated an understanding of the instructions.   The patient was advised to call back or seek an in-person evaluation if the symptoms worsen or if the condition fails to improve as anticipated.  I provided 12 minutes of non-face-to-face time during this encounter.   Tommi Rumps, MD

## 2021-02-14 NOTE — Assessment & Plan Note (Signed)
This issue has worsened.  Relpax has not been as beneficial.  She is interested in prophylactic treatment.  Discussed the option of an SNRI though she does not want to change the Zoloft as this has been quite beneficial.  We opted for propranolol.  Discussed that she needs to monitor for fatigue, heart rates less than 60, blood pressure is less than 100/60, and lightheadedness.  If she develops any side effects she will let us know.  Discussed the goal of treatment would be to reduce the frequency of her migraines and hopefully the severity.  We will follow-up in 1 month.  She will see neurology as planned in July.

## 2021-02-15 ENCOUNTER — Ambulatory Visit (INDEPENDENT_AMBULATORY_CARE_PROVIDER_SITE_OTHER): Payer: 59 | Admitting: Psychology

## 2021-02-15 DIAGNOSIS — F4323 Adjustment disorder with mixed anxiety and depressed mood: Secondary | ICD-10-CM

## 2021-02-28 ENCOUNTER — Encounter: Payer: Self-pay | Admitting: Gastroenterology

## 2021-02-28 ENCOUNTER — Ambulatory Visit: Payer: Managed Care, Other (non HMO) | Admitting: Gastroenterology

## 2021-02-28 VITALS — BP 134/104 | HR 63 | Ht 67.0 in | Wt 242.0 lb

## 2021-02-28 DIAGNOSIS — Z79899 Other long term (current) drug therapy: Secondary | ICD-10-CM

## 2021-02-28 DIAGNOSIS — K219 Gastro-esophageal reflux disease without esophagitis: Secondary | ICD-10-CM

## 2021-02-28 NOTE — Progress Notes (Signed)
HPI :  40 year old female, history of GERD, migraine headaches, allergies, referred by Tommi Rumps, MD for reflux, strong gag reflex.  The patient states she was pregnant in 2019 and at that time developed significant reflux symptoms that led to a very sensitive gag reflex.  Since that time she states she has had continued problems with reflux.  She endorses severe waterbrash, acid taste in her mouth that causes discomfort and sometimes leads to regurgitation/vomiting of acid.  She also has some pressure in her epigastric and lower chest area.  She has been on omeprazole 20 mg a day for the past year.  It has provided some benefit but symptoms appear to have worsened in recent months.  She has nocturnal symptoms that bother her a lot when lying down.  She has been sleeping with double pillows to reduce symptoms.  She will have breakthrough symptoms at least a few days a week on her current regimen.  She denies any dysphagia.  She is able to eat okay, denies any nausea or vomiting.  If she is around a foul smell she has a very strong gag.  She has had problems with migraine headaches which has led to vomiting when this occurs.  She had been using ibuprofen 800 mg at a time for migraines, at one point in time was using it once daily but has since been placed on propranolol which has been better controlled her migraines and she does not use NSAIDs as frequently as she was before.  No family history of esophageal cancer, her mother suffered from bulimia.  Patient states her weight has been stable for the past few years, no major changes there.  She has never had a prior endoscopy.     Past Medical History:  Diagnosis Date  . Allergic rhinitis   . Anxiety   . Chronic headaches    HORMONAL   . Complication of anesthesia   . Depression   . Elevated blood pressure   . Elevated blood pressure affecting pregnancy in third trimester, antepartum 10/16/2017  . GERD (gastroesophageal reflux disease)   .  Gestational hypertension 11/15/2017  . PONV (postoperative nausea and vomiting)      Past Surgical History:  Procedure Laterality Date  . DILATION AND CURETTAGE OF UTERUS    . DILATION AND EVACUATION N/A 01/07/2017   Procedure: DILATATION AND EVACUATION;  Surgeon: Malachy Mood, MD;  Location: [facility] ORS;  Service: Gynecology;  Laterality: N/A;  . TONSILLECTOMY AND ADENOIDECTOMY  1994   Family History  Problem Relation Age of Onset  . Alcoholism Other   . Breast cancer Maternal Aunt 51       Mothers aunt  . Lung cancer Other   . Hypertension Father   . Heart disease Father   . Hypertension Brother   . Hypertension Brother        prediabetes  . Lymphoma Paternal Grandfather   . Schizophrenia Maternal Uncle    Social History   Tobacco Use  . Smoking status: Former Smoker    Packs/day: 0.25    Years: 16.00    Pack years: 4.00    Types: Cigarettes    Quit date: 01/07/2015    Years since quitting: 6.1  . Smokeless tobacco: Never Used  Vaping Use  . Vaping Use: Never used  Substance Use Topics  . Alcohol use: Yes    Alcohol/week: 0.0 standard drinks    Comment: rare  . Drug use: No   Current Outpatient Medications  Medication  Sig Dispense Refill  . acetaminophen (TYLENOL) 500 MG tablet Take 500 mg by mouth every 6 (six) hours as needed.    Marland Kitchen buPROPion (WELLBUTRIN XL) 300 MG 24 hr tablet TAKE 1 TABLET BY MOUTH  DAILY 90 tablet 3  . cetirizine (ZYRTEC) 10 MG tablet Take 10 mg by mouth every morning.     . clobetasol cream (TEMOVATE) 3.66 % Apply 1 application topically 2 (two) times daily. 60 each 1  . diphenhydrAMINE (BENADRYL) 25 MG tablet Take 25 mg by mouth at bedtime as needed for sleep.    Marland Kitchen eletriptan (RELPAX) 20 MG tablet TAKE 1 TABLET BY MOUTH AS NEEDED FOR MIGRAINE OR HEADACHE. MAY REPEAT IN 2 HOURS IF HEADACHE PERSISTS OR RECURS. 9 tablet 1  . hydrOXYzine (ATARAX/VISTARIL) 10 MG tablet Take 1 tablet (10 mg total) by mouth 3 (three) times daily as needed for  anxiety. 30 tablet 0  . ibuprofen (ADVIL,MOTRIN) 600 MG tablet Take 1 tablet (600 mg total) by mouth every 6 (six) hours as needed for mild pain, moderate pain or cramping. 30 tablet 0  . omeprazole (PRILOSEC) 20 MG capsule TAKE 1 CAPSULE BY MOUTH  DAILY 90 capsule 1  . Prenatal Vit-Fe Fumarate-FA (PRENATAL MULTIVITAMIN) TABS tablet Take 1 tablet by mouth daily.     . prochlorperazine (COMPAZINE) 10 MG tablet Take 1 tablet (10 mg total) by mouth every 6 (six) hours as needed for nausea or vomiting. 12 tablet 0  . propranolol (INDERAL) 20 MG tablet Take 1 tablet (20 mg total) by mouth 2 (two) times daily. 60 tablet 1  . sertraline (ZOLOFT) 100 MG tablet TAKE 1 TABLET BY MOUTH  DAILY 90 tablet 3  . sodium chloride (OCEAN) 0.65 % SOLN nasal spray Place 1 spray into both nostrils as needed for congestion.     No current facility-administered medications for this visit.   No Known Allergies   Review of Systems: All systems reviewed and negative except where noted in HPI.   Lab Results  Component Value Date   WBC 5.4 01/19/2021   HGB 13.0 01/19/2021   HCT 38.8 01/19/2021   MCV 89 01/19/2021   PLT 207 01/19/2021    Lab Results  Component Value Date   CREATININE 0.69 01/19/2021   BUN 11 01/19/2021   NA 143 01/19/2021   K 3.3 (L) 01/19/2021   CL 104 01/19/2021   CO2 22 01/19/2021    Lab Results  Component Value Date   ALT 20 01/19/2021   AST 23 01/19/2021   ALKPHOS 68 01/19/2021   BILITOT 0.2 01/19/2021     Physical Exam: BP (!) 134/104   Pulse 63   Ht 5\' 7"  (1.702 m)   Wt 242 lb (109.8 kg)   BMI 37.90 kg/m  Constitutional: Pleasant,well-developed, female in no acute distress. HEENT: Normocephalic and atraumatic. Conjunctivae are normal. No scleral icterus. Neck supple.  Cardiovascular: Normal rate, regular rhythm.  Pulmonary/chest: Effort normal and breath sounds normal.  Abdominal: Soft, nondistended, nontender. There are no masses palpable.  Extremities: no  edema Lymphadenopathy: No cervical adenopathy noted. Neurological: Alert and oriented to person place and time. Skin: Skin is warm and dry. No rashes noted. Psychiatric: Normal mood and affect. Behavior is normal.   ASSESSMENT AND PLAN: 41 year old female here for new patient consultation regarding the following:  GERD Long-term use of PPI  Patient has typical symptoms of reflux, worse symptom for her husband waterbrash, and epigastric pressure which I suspect may represent esophagitis.  She has  had some benefit from low-dose omeprazole but symptoms have worsened on this regimen recently.  We discussed reflux in general and treatment options.  Given persistent symptoms despite low-dose PPI, I do not think she would do well with H2 blockers.  We did discuss her long-term use of PPIs, long-term associated risks to include chronic kidney disease, bone fracture, etc.  We discussed her weight and that weight loss potentially could reduce her reflux symptoms, she has been working on that although difficult for her.  Regarding management of this, I am recommending we increase her dose of omeprazole at this time.  Would increase to 40 mg daily for the next few weeks and see how she does on that dose, take half an hour to 60 minutes before meal.  If she still has breakthrough despite that dosing she can increase to 40 mg twice daily for few weeks to see if that will resolve it.  Given her persistent reflux symptoms despite PPI, I offered her an upper endoscopy to further evaluate her upper tract, rule out large hiatal hernia, etc.  Following discussions of risk benefits of endoscopy and anesthesia she wants to proceed.  Further recommendations pending that result and her course.  I have recommended she avoid/minimize NSAID use in the interim.  She agreed with the plan  Plan: - EGD at the [facility] - avoid NSAIDs if possible - incease omeprazole to 40mg  daily for the next 1-2 weeks, and if no improvement can go to  40mg  BID - discussed GERD in general, risks of chronic PPI as above, long term want to use lowest dose needed to control symptoms  Liberty Cellar, MD [facility]  CC: Leone Haven, MD

## 2021-02-28 NOTE — Patient Instructions (Signed)
If you are age 39 or older, your body mass index should be between 23-30. Your Body mass index is 37.9 kg/m. If this is out of the aforementioned range listed, please consider follow up with your Primary Care Provider.  If you are age 87 or younger, your body mass index should be between 19-25. Your Body mass index is 37.9 kg/m. If this is out of the aformentioned range listed, please consider follow up with your Primary Care Provider.   __________________________________________________________  The [facility] providers would like to encourage you to use [facility] to communicate with providers for non-urgent requests or questions.  Due to long hold times on the telephone, sending your provider a message by [facility] may be a faster and more efficient way to get a response.  Please allow 48 business hours for a response.  Please remember that this is for non-urgent requests.   Due to recent changes in healthcare laws, you may see the results of your imaging and laboratory studies on MyChart before your provider has had a chance to review them.  We understand that in some cases there may be results that are confusing or concerning to you. Not all laboratory results come back in the same time frame and the provider may be waiting for multiple results in order to interpret others.  Please give [place] 48 hours in order for your provider to thoroughly review all the results before contacting the office for clarification of your results.   Please increase the omeprazole to 40mg  twice a day for 1-2 weeks. If no improvement increase to 40 mg twice a day permanently.  It was a pleasure to see you today!  Thank you for trusting me with your gastrointestinal care!

## 2021-03-06 ENCOUNTER — Telehealth: Payer: Self-pay

## 2021-03-06 NOTE — Telephone Encounter (Signed)
Patient calling to inform she is unable to come for appt EGD 06/15 at 10:30am.. thanks

## 2021-03-06 NOTE — Telephone Encounter (Signed)
Called and left message for patient that Dr. Havery Moros has an opening for an EGD tomorrow, 6-15 at 10:30 am. She would need to arrive by 9:30 am, have no solid food, milk or milk products after midnight and clear liquids only until 7:30 am.  She would have to have someone drive her, wait and drive her home. Asked that she call me back ASAP to let me know if she would like to take that opening.  We also have an opening on Monday, 6-20 at 11:00am

## 2021-03-11 ENCOUNTER — Other Ambulatory Visit: Payer: Self-pay | Admitting: Family Medicine

## 2021-03-14 ENCOUNTER — Other Ambulatory Visit: Payer: Self-pay | Admitting: Family Medicine

## 2021-03-20 ENCOUNTER — Telehealth: Payer: Self-pay

## 2021-03-20 NOTE — Telephone Encounter (Signed)
Called and spoke to patient.  Rescheduled her for tomorrow morning at 9:30am. She understands to arrive at  8:30am. Clear liquids only after midnight UNTIL 6:30am.

## 2021-03-20 NOTE — Telephone Encounter (Signed)
Pt returned your call, she stated that she could come in tomorrow. I saw an  8:00 am opening tomorrow so I mentioned that to pt. However, I told her that It might not be at that time. Anyway, pt said that she is available to come in tomorrow. Pls call her again to confirm. Her phone is [phone].

## 2021-03-20 NOTE — Telephone Encounter (Signed)
Wonderful thanks Jan

## 2021-03-20 NOTE — Telephone Encounter (Signed)
LM for patient that Dr. Havery Moros has an opening tomorrow Wed 6-29. She is scheduled for EGD on 9-2. Asked that she call back to discuss.

## 2021-03-21 ENCOUNTER — Encounter: Payer: Self-pay | Admitting: Gastroenterology

## 2021-03-21 ENCOUNTER — Other Ambulatory Visit: Payer: Self-pay

## 2021-03-21 ENCOUNTER — Ambulatory Visit (AMBULATORY_SURGERY_CENTER): Payer: Managed Care, Other (non HMO) | Admitting: Gastroenterology

## 2021-03-21 VITALS — BP 133/80 | HR 67 | Temp 97.1°F | Resp 25 | Ht 67.0 in | Wt 242.0 lb

## 2021-03-21 DIAGNOSIS — K219 Gastro-esophageal reflux disease without esophagitis: Secondary | ICD-10-CM | POA: Diagnosis not present

## 2021-03-21 MED ORDER — SODIUM CHLORIDE 0.9 % IV SOLN: 500.0000 mL | Freq: Once | INTRAVENOUS | Status: DC

## 2021-03-21 NOTE — Progress Notes (Signed)
C.W. vital signs. 

## 2021-03-21 NOTE — Op Note (Signed)
[facility] Patient Name: Melinda Cortez Procedure Date: 03/21/2021 9:49 AM MRN: [id number] Endoscopist: Remo Lipps P. Havery Moros , MD Age: 40 Referring MD:  Date of Birth: Nov 30, 1980 Gender: Female Account #: [id number] Procedure:                Upper GI endoscopy Indications:              history of gastro-esophageal reflux disease - low                            dose omeprazole failed to control symptoms, now on                            40mg  omeprazole / day and doing better, still                            occasional breakthrough Medicines:                Monitored Anesthesia Care Procedure:                Pre-Anesthesia Assessment:                           - Prior to the procedure, a History and Physical                            was performed, and patient medications and                            allergies were reviewed. The patient's tolerance of                            previous anesthesia was also reviewed. The risks                            and benefits of the procedure and the sedation                            options and risks were discussed with the patient.                            All questions were answered, and informed consent                            was obtained. Prior Anticoagulants: The patient has                            taken no previous anticoagulant or antiplatelet                            agents. ASA Grade Assessment: II - A patient with                            mild systemic disease. After reviewing the risks  and benefits, the patient was deemed in                            satisfactory condition to undergo the procedure.                           After obtaining informed consent, the endoscope was                            passed under direct vision. Throughout the                            procedure, the patient's blood pressure, pulse, and                            oxygen saturations were  monitored continuously. The                            Endoscope was introduced through the mouth, and                            advanced to the second part of duodenum. The upper                            GI endoscopy was accomplished without difficulty.                            The patient tolerated the procedure well. Scope In: Scope Out: Findings:                 Esophagogastric landmarks were identified: the                            Z-line was found at 37 cm, the gastroesophageal                            junction was found at 37 cm and the upper extent of                            the gastric folds was found at 39 cm from the                            incisors.                           A 2 cm hiatal hernia was present.                           The gastroesophageal flap valve was visualized                            endoscopically and classified as Hill Grade III                            (  minimal fold, loose to endoscope, hiatal hernia                            likely).                           The exam of the esophagus was otherwise normal.                           The entire examined stomach was normal.                           The duodenal bulb and second portion of the                            duodenum were normal. Complications:            No immediate complications. Estimated blood loss:                            None. Estimated Blood Loss:     Estimated blood loss: none. Impression:               - Esophagogastric landmarks identified.                           - 2 cm hiatal hernia.                           - Gastroesophageal flap valve classified as Hill                            Grade III                           - Normal esophagus otherwise                           - Normal stomach.                           - Normal duodenal bulb and second portion of the                            duodenum. Recommendation:           - Patient has a contact  number available for                            emergencies. The signs and symptoms of potential                            delayed complications were discussed with the                            patient. Return to normal activities tomorrow.  Written discharge instructions were provided to the                            patient.                           - Resume previous diet.                           - Continue present medications.                           - Long term use lowest dose of PPI to control                            symptoms, however 20mg  omeprazole not strong                            enough. Continue 40mg  / day for now, can increase                            to twice daily dosing as needed if significant                            breakthrough                           - Weight loss over time will help reduce reflux                            symptoms, as discussed in clinic                           - If sympoms persist despite omeprazole moving                            forward, please contact me to discuss options Remo Lipps P. Deondray Ospina, MD 03/21/2021 10:11:18 AM This report has been signed electronically.

## 2021-03-21 NOTE — Progress Notes (Signed)
C.w. vital signs.

## 2021-03-21 NOTE — Patient Instructions (Signed)
Continue 40 mg Omeprazole /day for now , can increase to twice daily if needed     Handout on hiatal hernia given to you today   YOU HAD AN ENDOSCOPIC PROCEDURE TODAY AT [place]:   Refer to the procedure report that was given to you for any specific questions about what was found during the examination.  If the procedure report does not answer your questions, please call your gastroenterologist to clarify.  If you requested that your care partner not be given the details of your procedure findings, then the procedure report has been included in a sealed envelope for you to review at your convenience later.  YOU SHOULD EXPECT: Some feelings of bloating in the abdomen. Passage of more gas than usual.  Walking can help get rid of the air that was put into your GI tract during the procedure and reduce the bloating. If you had a lower endoscopy (such as a colonoscopy or flexible sigmoidoscopy) you may notice spotting of blood in your stool or on the toilet paper. If you underwent a bowel prep for your procedure, you may not have a normal bowel movement for a few days.  Please Note:  You might notice some irritation and congestion in your nose or some drainage.  This is from the oxygen used during your procedure.  There is no need for concern and it should clear up in a day or so.  SYMPTOMS TO REPORT IMMEDIATELY:  Following lower endoscopy (colonoscopy or flexible sigmoidoscopy):  Excessive amounts of blood in the stool  Significant tenderness or worsening of abdominal pains  Swelling of the abdomen that is new, acute  Fever of 100F or higher  Following upper endoscopy (EGD)  Vomiting of blood or coffee ground material  New chest pain or pain under the shoulder blades  Painful or persistently difficult swallowing  New shortness of breath  Fever of 100F or higher  Black, tarry-looking stools  For urgent or emergent issues, a gastroenterologist can be reached at any  hour by calling [phone]. Do not use MyChart messaging for urgent concerns.    DIET:  We do recommend a small meal at first, but then you may proceed to your regular diet.  Drink plenty of fluids but you should avoid alcoholic beverages for 24 hours.  ACTIVITY:  You should plan to take it easy for the rest of today and you should NOT DRIVE or use heavy machinery until tomorrow (because of the sedation medicines used during the test).    FOLLOW UP: Our staff will call the number listed on your records 48-72 hours following your procedure to check on you and address any questions or concerns that you may have regarding the information given to you following your procedure. If we do not reach you, we will leave a message.  We will attempt to reach you two times.  During this call, we will ask if you have developed any symptoms of COVID 19. If you develop any symptoms (ie: fever, flu-like symptoms, shortness of breath, cough etc.) before then, please call [phone].  If you test positive for Covid 19 in the 2 weeks post procedure, please call and report this information to [place].    If any biopsies were taken you will be contacted by phone or by letter within the next 1-3 weeks.  Please call us at [phone] if you have not heard about the biopsies in 3 weeks.    SIGNATURES/CONFIDENTIALITY: You and/or  your care partner have signed paperwork which will be entered into your electronic medical record.  These signatures attest to the fact that that the information above on your After Visit Summary has been reviewed and is understood.  Full responsibility of the confidentiality of this discharge information lies with you and/or your care-partner.

## 2021-03-21 NOTE — Progress Notes (Signed)
Report to PACU, RN, vss, BBS= Clear.  

## 2021-03-23 ENCOUNTER — Telehealth: Payer: Self-pay

## 2021-03-23 NOTE — Telephone Encounter (Signed)
Called # [phone] and left a message we tried to reach pt for a follow up call. maw

## 2021-04-02 ENCOUNTER — Other Ambulatory Visit: Payer: Self-pay

## 2021-04-02 MED ORDER — OMEPRAZOLE 40 MG PO CPDR: 40.0000 mg | DELAYED_RELEASE_CAPSULE | Freq: Every day | ORAL | 1 refills | Status: DC

## 2021-04-02 NOTE — Progress Notes (Signed)
Refill sent to [facility].  

## 2021-04-11 ENCOUNTER — Other Ambulatory Visit: Payer: Self-pay | Admitting: Family Medicine

## 2021-04-11 DIAGNOSIS — G43009 Migraine without aura, not intractable, without status migrainosus: Secondary | ICD-10-CM

## 2021-04-12 NOTE — Progress Notes (Signed)
NEUROLOGY CONSULTATION NOTE  PRESCIOUS HURLESS MRN: [id number] DOB: 1981/04/14  Referring provider: Tommi Rumps, MD Primary care provider: Tommi Rumps, MD  Reason for consult:  headache  Assessment/Plan:   1  Menstrually related migraine, without status migrainosus, not intractable 2  Lumbar radiculitis  Migraine prevention:  Increase propranolol to ER 80mg  daily.   Migraine rescue:  Due to severity of nausea, switch to dissolvable tablet- change Relpax to Maxalt-MLT 10mg  and from Compazine to Zofran ODT 4mg  Limit use of pain relievers to no more than 2 days out of week to prevent risk of rebound or medication-overuse headache. Keep headache diary Regarding back pain and radiculitis, refer to physical therapy. Follow up 6 months.    Subjective:  Melinda Cortez is a 40 year old female with depression and anxiety who presents for headaches.  History supplemented by referring provider's note.  Onset:  late 12s - early 17s, worse over past 2 years. Location:  left retro-orbital Quality:  pressure/stabbing Intensity:  Mild and severe.   Aura:  absent Prodrome:  absent Associated symptoms:  Nausea, vomiting, photophobia, phonophobia, osmophobia, feels like she may not have "full vision" but no blurred vision.  She denies associated  unilateral numbness or weakness. Duration:  severe pain lasts 1 day with lingering mild headache for 6 days - used to occur during week of menstrual period but more recently occurring week after or 2 weeks after Frequency:  once a month Frequency of abortive medication: 7 days a month Triggers:  hormonal Relieving factors:  heating pad on head, sleep in dark room Activity:  movement/activity aggravates She was seen in the ED in May 2022 for intractable migraine.  Due to nausea, wasn't able to keep her Relpax down.  Received headache cocktail which helped.  She had her son in 2019.  She received an epidural.  Since then, she has had  intermittent shooting pains down both legs, typically anterior thigh.  She does report some low back pain.  No leg weakness or numbness.  Current NSAIDS/analgesics:  Tylenol Current triptans:  Relpax 40mg  Current ergotamine:  none Current anti-emetic:  Compazine 10mg  Current muscle relaxants:  none Current Antihypertensive medications:  propranolol 20mg  BID (started right after ED visit) Current Antidepressant medications:  sertraline 100mg  QD, Wellbutrin XL 300mg  QD Current Anticonvulsant medications:  none Current anti-CGRP:  none Current Vitamins/Herbal/Supplements:  none Current Antihistamines/Decongestants:  Zyrtec, Benadryl Other therapy:  heating pad Hormone/birth control:  none  Past NSAIDS/analgesics:  ibuprofen, meloxicam Past abortive triptans:  none Past abortive ergotamine:  none Past muscle relaxants:  none Past anti-emetic:  Reglan, Zofran (during pregnancy), promethazine Past antihypertensive medications:  labetolol Past antidepressant medications:  none Past anticonvulsant medications:  none Past anti-CGRP:  none Past vitamins/Herbal/Supplements:  none Past antihistamines/decongestants:  hydroxyzine Other past therapies:  prednisone  Caffeine:  Diet soda on weekends.  Occasional coffee. Alcohol:  on occasion Smoker:  no Diet:  close to 64 oz water daily.  Skips breakfast Exercise:  yes Depression:  yes; Anxiety:  yes - treated Other pain: knee pain Sleep:  trouble falling asleep. Able to stay asleep.  Sometimes takes melatonin Family history of headache:  brother, father, paternal grandmother      PAST MEDICAL HISTORY: Past Medical History:  Diagnosis Date   Allergic rhinitis    Anxiety    Chronic headaches    HORMONAL    Complication of anesthesia    Depression    Elevated blood pressure    Elevated blood  pressure affecting pregnancy in third trimester, antepartum 10/16/2017   GERD (gastroesophageal reflux disease)    Gestational hypertension  11/15/2017   PONV (postoperative nausea and vomiting)     PAST SURGICAL HISTORY: Past Surgical History:  Procedure Laterality Date   DILATION AND CURETTAGE OF UTERUS     DILATION AND EVACUATION N/A 01/07/2017   Procedure: DILATATION AND EVACUATION;  Surgeon: Malachy Mood, MD;  Location: [facility] ORS;  Service: Gynecology;  Laterality: N/A;   TONSILLECTOMY AND ADENOIDECTOMY  1994    MEDICATIONS: Current Outpatient Medications on File Prior to Visit  Medication Sig Dispense Refill   acetaminophen (TYLENOL) 500 MG tablet Take 500 mg by mouth every 6 (six) hours as needed.     buPROPion (WELLBUTRIN XL) 300 MG 24 hr tablet TAKE 1 TABLET BY MOUTH  DAILY 90 tablet 3   cetirizine (ZYRTEC) 10 MG tablet Take 10 mg by mouth every morning.      clobetasol cream (TEMOVATE) 6.37 % Apply 1 application topically 2 (two) times daily. 60 each 1   diphenhydrAMINE (BENADRYL) 25 MG tablet Take 25 mg by mouth at bedtime as needed for sleep.     eletriptan (RELPAX) 20 MG tablet TAKE 1 TABLET BY MOUTH AS NEEDED FOR MIGRAINE OR HEADACHE. MAY REPEAT IN 2 HOURS IF HEADACHE PERSISTS OR RECURS. 9 tablet 1   hydrOXYzine (ATARAX/VISTARIL) 10 MG tablet Take 1 tablet (10 mg total) by mouth 3 (three) times daily as needed for anxiety. 30 tablet 0   ibuprofen (ADVIL,MOTRIN) 600 MG tablet Take 1 tablet (600 mg total) by mouth every 6 (six) hours as needed for mild pain, moderate pain or cramping. 30 tablet 0   omeprazole (PRILOSEC) 40 MG capsule Take 1 capsule (40 mg total) by mouth daily. 90 capsule 1   Prenatal Vit-Fe Fumarate-FA (PRENATAL MULTIVITAMIN) TABS tablet Take 1 tablet by mouth daily.      prochlorperazine (COMPAZINE) 10 MG tablet Take 1 tablet (10 mg total) by mouth every 6 (six) hours as needed for nausea or vomiting. 12 tablet 0   propranolol (INDERAL) 20 MG tablet Take 1 tablet (20 mg total) by mouth 2 (two) times daily. 60 tablet 1   sertraline (ZOLOFT) 100 MG tablet TAKE 1 TABLET BY MOUTH  DAILY 90 tablet 3    sodium chloride (OCEAN) 0.65 % SOLN nasal spray Place 1 spray into both nostrils as needed for congestion.     No current facility-administered medications on file prior to visit.    ALLERGIES: No Known Allergies  FAMILY HISTORY: Family History  Problem Relation Age of Onset   Alcoholism Other    Breast cancer Maternal Aunt 62       Mothers aunt   Lung cancer Other    Hypertension Father    Heart disease Father    Hypertension Brother    Hypertension Brother        prediabetes   Lymphoma Paternal Grandfather    Schizophrenia Maternal Uncle     Objective:  Blood pressure 118/83, pulse 70, height 5\' 7"  (1.702 m), weight 241 lb 6 oz (109.5 kg), SpO2 96 %. General: No acute distress.  Patient appears well-groomed.   Head:  Normocephalic/atraumatic Eyes:  fundi examined but not visualized Neck: supple, no paraspinal tenderness, full range of motion Back: No paraspinal tenderness Heart: regular rate and rhythm Lungs: Clear to auscultation bilaterally. Vascular: No carotid bruits. Neurological Exam: Mental status: alert and oriented to person, place, and time, recent and remote memory intact, fund of knowledge  intact, attention and concentration intact, speech fluent and not dysarthric, language intact. Cranial nerves: CN I: not tested CN II: pupils equal, round and reactive to light, visual fields intact CN III, IV, VI:  full range of motion, no nystagmus, no ptosis CN V: facial sensation intact. CN VII: upper and lower face symmetric CN VIII: hearing intact CN IX, X: gag intact, uvula midline CN XI: sternocleidomastoid and trapezius muscles intact CN XII: tongue midline Bulk & Tone: normal, no fasciculations. Motor:  muscle strength 5/5 throughout Sensation:  Pinprick, temperature and vibratory sensation intact. Deep Tendon Reflexes:  2+ throughout,  toes downgoing.   Finger to nose testing:  Without dysmetria.   Heel to shin:  Without dysmetria.   Gait:  Normal  station and stride.  Romberg negative.    Thank you for allowing me to take part in the care of this patient.  Metta Clines, DO  CC: Tommi Rumps, MD

## 2021-04-13 ENCOUNTER — Other Ambulatory Visit: Payer: Self-pay

## 2021-04-13 ENCOUNTER — Encounter: Payer: Self-pay | Admitting: Neurology

## 2021-04-13 ENCOUNTER — Ambulatory Visit: Payer: Managed Care, Other (non HMO) | Admitting: Neurology

## 2021-04-13 VITALS — BP 118/83 | HR 70 | Ht 67.0 in | Wt 241.4 lb

## 2021-04-13 DIAGNOSIS — G43829 Menstrual migraine, not intractable, without status migrainosus: Secondary | ICD-10-CM

## 2021-04-13 DIAGNOSIS — M5416 Radiculopathy, lumbar region: Secondary | ICD-10-CM | POA: Diagnosis not present

## 2021-04-13 MED ORDER — PROPRANOLOL HCL ER 80 MG PO CP24: 80.0000 mg | ORAL_CAPSULE | Freq: Every day | ORAL | 5 refills | Status: DC

## 2021-04-13 MED ORDER — ONDANSETRON 4 MG PO TBDP: 4.0000 mg | ORAL_TABLET | Freq: Three times a day (TID) | ORAL | 5 refills | Status: DC | PRN

## 2021-04-13 MED ORDER — RIZATRIPTAN BENZOATE 10 MG PO TBDP: ORAL_TABLET | ORAL | 5 refills | Status: DC

## 2021-04-13 NOTE — Patient Instructions (Signed)
  Increase propranolol to ER '80mg'$  once daily.  Contact us in 8 weeks with update and we can increase dose if needed. STOP ELETRIPTAN.  Take RIZATRIPTAN '10mg'$  at earliest onset of headache.  May repeat dose once in 2 hours if needed.  Maximum 2 tablets in 24 hours. STOP COMPAZINE.  Take ONDANSETRON/ZOFRAN for nausea Limit use of pain relievers to no more than 2 days out of the week.  These medications include acetaminophen, NSAIDs (ibuprofen/Advil/Motrin, naproxen/Aleve, triptans (Imitrex/sumatriptan), Excedrin, and narcotics.  This will help reduce risk of rebound headaches. Be aware of common food triggers:  - Caffeine:  coffee, black tea, cola, Mt. Dew  - Chocolate  - Dairy:  aged cheeses (brie, blue, cheddar, gouda, [place], provolone, [place], Swiss, etc), chocolate milk, buttermilk, sour cream, limit eggs and yogurt  - Nuts, peanut butter  - Alcohol  - Cereals/grains:  FRESH breads (fresh bagels, sourdough, doughnuts), yeast productions  - Processed/canned/aged/cured meats (pre-packaged deli meats, hotdogs)  - MSG/glutamate:  soy sauce, flavor enhancer, pickled/preserved/marinated foods  - Sweeteners:  aspartame (Equal, Nutrasweet).  Sugar and Splenda are okay  - Vegetables:  legumes (lima beans, lentils, snow peas, fava beans, pinto peans, peas, garbanzo beans), sauerkraut, onions, olives, pickles  - Fruit:  avocados, bananas, citrus fruit (orange, lemon, grapefruit), mango  - Other:  Frozen meals, macaroni and cheese Routine exercise Stay adequately hydrated (aim for 64 oz water daily) Keep headache diary Maintain proper stress management Maintain proper sleep hygiene Do not skip meals Consider supplements:  magnesium citrate '400mg'$  daily, riboflavin '400mg'$  daily, coenzyme Q10 '100mg'$  three times daily.  I will also refer you to physical therapy for back pain with lumbar radiculitis

## 2021-04-24 ENCOUNTER — Other Ambulatory Visit: Payer: Self-pay

## 2021-04-24 DIAGNOSIS — M5416 Radiculopathy, lumbar region: Secondary | ICD-10-CM

## 2021-04-24 NOTE — Progress Notes (Signed)
Per last ov note patent to be  referred  to PT.  Pt advised.

## 2021-04-26 ENCOUNTER — Encounter: Payer: Self-pay | Admitting: Physical Therapy

## 2021-04-26 ENCOUNTER — Other Ambulatory Visit: Payer: Self-pay

## 2021-04-26 ENCOUNTER — Ambulatory Visit: Payer: Managed Care, Other (non HMO) | Attending: Family Medicine | Admitting: Physical Therapy

## 2021-04-26 DIAGNOSIS — M6281 Muscle weakness (generalized): Secondary | ICD-10-CM

## 2021-04-26 DIAGNOSIS — M5416 Radiculopathy, lumbar region: Secondary | ICD-10-CM | POA: Diagnosis present

## 2021-04-26 DIAGNOSIS — R293 Abnormal posture: Secondary | ICD-10-CM

## 2021-04-26 NOTE — Therapy (Signed)
[facility] [address] [place], [place], [address] Phone: [phone]   Fax:  [phone]  Physical Therapy Evaluation  Patient Details  Name: Melinda Cortez MRN: [id number] Date of Birth: Jun 08, 1981 Referring Provider (PT): Metta Clines DO   Encounter Date: 04/26/2021   PT End of Session - 04/26/21 1404     Visit Number 1    Number of Visits 13    Date for PT Re-Evaluation 06/07/21    Authorization Type [employer]    PT Start Time 1404    PT Stop Time [id number]    PT Time Calculation (min) 41 min    Activity Tolerance Patient tolerated treatment well    Behavior During Therapy [facility] for tasks assessed/performed             Past Medical History:  Diagnosis Date   Allergic rhinitis    Anxiety    Chronic headaches    HORMONAL    Complication of anesthesia    Depression    Elevated blood pressure    Elevated blood pressure affecting pregnancy in third trimester, antepartum 10/16/2017   GERD (gastroesophageal reflux disease)    Gestational hypertension 11/15/2017   PONV (postoperative nausea and vomiting)     Past Surgical History:  Procedure Laterality Date   DILATION AND CURETTAGE OF UTERUS     DILATION AND EVACUATION N/A 01/07/2017   Procedure: DILATATION AND EVACUATION;  Surgeon: Malachy Mood, MD;  Location: [facility] ORS;  Service: Gynecology;  Laterality: N/A;   TONSILLECTOMY AND ADENOIDECTOMY  1994    There were no vitals filed for this visit.    Subjective Assessment - 04/26/21 1407     Subjective Pt reports seeing Dr. Tomi Likens regarding migraines a few weeks ago. Pt reported to him that after having her son in 2019 she would get abnormal jerking sensation in her legs resulting in PT referral. Pt with issues of low back pain since she was 19; however, reports that the random feeling/reflex in her legs have gradually gotten worse since having her son. It doesn't hurt, it's just annoying. Recently noticed  within the last year she feels she has been getting restless legs (not on any medication). Pt reports having greater difficulty falling asleep. Feeling the jerking/reflex sensation >5x a day.    Pertinent History LBP, migraines    Limitations Other (comment)   Sleeping   Patient Stated Goals Ease up the jerking leg motions.    Currently in Pain? No/denies    Pain Location Leg    Pain Orientation Right;Left    Pain Descriptors / Indicators --   Jerking   Pain Type Chronic pain    Aggravating Factors  More tired can feel more of the restless leg    Pain Relieving Factors With the restless leg she would get up and stretch                [facility] PT Assessment - 04/26/21 0001       Assessment   Medical Diagnosis Lumbar radiciulitis    Referring Provider (PT) Metta Clines DO    Onset Date/Surgical Date --   2019   Prior Therapy Saw PT for R ankle in 2003      Precautions   Precautions None      Restrictions   Weight Bearing Restrictions No      Balance Screen   Has the patient fallen in the past 6 months No      Home Environment  Living Environment Private residence    Living Arrangements Spouse/significant other;Children    Available Help at Discharge Family    Type of [place] to enter    Entrance Stairs-Number of Steps 1    [place] Two level    Alternate Level Stairs-Number of Steps 12      Prior Function   Level of Independence Independent    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     Sensation   Light Touch Appears Intact      Functional Tests   Functional tests Other;Single leg stance      Single Leg Stance   Comments 30 sec bilat      Other:   Other/ Comments heel walking and toe walking [facility]      Posture/Postural Control   Posture/Postural Control Postural limitations    Postural Limitations Increased lumbar lordosis;Forward head;Anterior pelvic tilt      Deep Tendon Reflexes   DTR Assessment Site Patella;Achilles    Patella DTR  3+   3+ on L; 2+ on R   Achilles DTR 3+   3+ on L; 2+ on R     ROM / Strength   AROM / PROM / Strength Strength;AROM      AROM   AROM Assessment Site Lumbar    Lumbar Flexion WFL    Lumbar Extension WFL    Lumbar - Right Side Bend WFL    Lumbar - Left Side Bend WFL "Feels a little pinch on the L"    Lumbar - Right Rotation WFL "I feel a little pulling on the left"    Lumbar - Left Rotation [facility]      Strength   Strength Assessment Site Hip;Knee;Ankle    Right/Left Hip Right;Left    Right Hip Flexion 5/5    Right Hip Extension 4-/5    Right Hip External Rotation  4+/5    Right Hip Internal Rotation 4+/5    Right Hip ABduction 4+/5    Right Hip ADduction 4/5    Left Hip Flexion 5/5    Left Hip Extension 4-/5    Left Hip External Rotation 4+/5    Left Hip Internal Rotation 4+/5    Left Hip ABduction 3/5    Left Hip ADduction 4/5    Right/Left Knee --   Grossly 5/5   Right/Left Ankle --   At least 4+/5     Palpation   Palpation comment --      Special Tests    Special Tests Lumbar    Lumbar Tests FABER test;Slump Test;Straight Leg Raise;Prone Knee Bend Test      FABER test   findings Positive    Side LEft    Comment Feels in back      Prone Knee Bend Test   Findings Negative      Straight Leg Raise   Findings Negative    Comment Shaking/jerking when lifting L LE but pt reports no pain/discomfort                        Objective measurements completed on examination: See above findings.                    PT Long Term Goals - 04/26/21 1452       PT LONG TERM GOAL #1   Title Pt will be independent with HEP    Time 6  Period Weeks    Status New    Target Date 06/07/21      PT LONG TERM GOAL #2   Title Pt will report resolution of her bilat LE jerking sensation    Time 6    Period Weeks    Status New    Target Date 06/07/21      PT LONG TERM GOAL #3   Title Pt will be able to perform plank for at least 30 sec to demo  improved core stability    Baseline unable to perform bilat LE raise in supine    Time 6    Period Weeks    Status New    Target Date 06/07/21      PT LONG TERM GOAL #4   Title Pt will be able to demonstrate good lifting mechanics of weight >/=25#    Time 6    Period Weeks    Status New    Target Date 06/07/21                    Plan - 04/26/21 1441     Clinical Impression Statement Ms. Texanna Mcclaugherty is a 40 y/o F presenting to OPPT due to complaint of chronic low back and worsening of spontaneous LE jerking since the birth of her son in 2019. Pt notes that the jerking/spasm does not hurt but is uncomfortable. On assessment, pt demos L>R LE hyperreflexivity, increased flexibility, weak L hip abductors/extensors, and weakened core. "Jerking" was illicited with hamstring stretch on L during SLR testing. Pt would benefit from PT to reach pt's goals for return to her normal baseline.    Personal Factors and Comorbidities Age;Fitness;Social Background;Time since onset of injury/illness/exacerbation    Examination-Activity Limitations Caring for Others;Bend;Carry    Examination-Participation Restrictions Community Activity;Cleaning    Stability/Clinical Decision Making Stable/Uncomplicated    Clinical Decision Making Low    Rehab Potential Good    PT Frequency 1x / week    PT Duration 6 weeks    PT Treatment/Interventions Therapeutic activities;Therapeutic exercise;Balance training;Neuromuscular re-education;Functional mobility training;Gait training;Manual techniques;Dry needling;Taping;Patient/family education;Traction    PT Next Visit Plan Initiate core (consider table planks, PPT, bicycles) and hip strengthening (focus on L hip abductors, bilat hip extensors). Please continue to monitor for any upper neuron signs such as ankle clonus since pt is demonstrating some abnormal signs on L which could be indicative of nerve/neural damage s/p her pregnancy.    Consulted and Agree with  Plan of Care Patient             Patient will benefit from skilled therapeutic intervention in order to improve the following deficits and impairments:  Hypermobility, Decreased mobility, Decreased strength, Postural dysfunction, Improper body mechanics, Increased muscle spasms  Visit Diagnosis: Muscle weakness (generalized)  Abnormal posture  Radiculopathy, lumbar region     Problem List Patient Active Problem List   Diagnosis Date Noted   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   Vomiting 01/19/2021   GERD (gastroesophageal reflux disease) 05/17/2019   Late period 05/12/2018   Obesity (BMI 35.0-39.9 without comorbidity) 09/09/2017   Migraine 03/04/2017   Elevated BP without diagnosis of hypertension 01/14/2017   Allergic rhinitis 09/12/2016   Anxiety and depression 01/30/2016    Jaquavis Felmlee April Ma L Keeana Pieratt PT, DPT 04/26/2021, 4:26 PM  [facility] [address] [place], [place], [address] Phone: [phone]   Fax:  [phone]  Name: DEZMARIE CIVELLO MRN: [id number] Date of Birth: 1981-01-01

## 2021-05-02 ENCOUNTER — Other Ambulatory Visit: Payer: Self-pay

## 2021-05-02 ENCOUNTER — Ambulatory Visit: Payer: Managed Care, Other (non HMO) | Admitting: Physical Therapy

## 2021-05-02 DIAGNOSIS — M6281 Muscle weakness (generalized): Secondary | ICD-10-CM | POA: Diagnosis not present

## 2021-05-02 DIAGNOSIS — R293 Abnormal posture: Secondary | ICD-10-CM

## 2021-05-02 DIAGNOSIS — M5416 Radiculopathy, lumbar region: Secondary | ICD-10-CM

## 2021-05-02 NOTE — Therapy (Signed)
[facility] [address] [place], [place], [address] Phone: [phone]   Fax:  [phone]  Physical Therapy Treatment  Patient Details  Name: [name] MRN: [id number] Date of Birth: February 21, 1981 Referring Provider (PT): Metta Clines DO   Encounter Date: 05/02/2021   PT End of Session - 05/02/21 1446     Visit Number 2    Number of Visits 13    Date for PT Re-Evaluation 06/07/21    Authorization Type [employer]    PT Start Time 1446    PT Stop Time 1530    PT Time Calculation (min) 44 min    Activity Tolerance Patient tolerated treatment well    Behavior During Therapy [facility] for tasks assessed/performed             Past Medical History:  Diagnosis Date   Allergic rhinitis    Anxiety    Chronic headaches    HORMONAL    Complication of anesthesia    Depression    Elevated blood pressure    Elevated blood pressure affecting pregnancy in third trimester, antepartum 10/16/2017   GERD (gastroesophageal reflux disease)    Gestational hypertension 11/15/2017   PONV (postoperative nausea and vomiting)     Past Surgical History:  Procedure Laterality Date   DILATION AND CURETTAGE OF UTERUS     DILATION AND EVACUATION N/A 01/07/2017   Procedure: DILATATION AND EVACUATION;  Surgeon: Malachy Mood, MD;  Location: [facility] ORS;  Service: Gynecology;  Laterality: N/A;   TONSILLECTOMY AND ADENOIDECTOMY  1994    There were no vitals filed for this visit.   Subjective Assessment - 05/02/21 1449     Subjective Pt states she's been doing okay. Nothing new to report.    Pertinent History LBP, migraines    Limitations Other (comment)   Sleeping   Patient Stated Goals Ease up the jerking leg motions.    Currently in Pain? No/denies                               [facility] Adult PT Treatment/Exercise - 05/02/21 0001       Exercises   Exercises Lumbar;Knee/Hip      Lumbar Exercises: Aerobic    Other Aerobic Exercise Scifit L2 UEs/LEs x 5 min      Lumbar Exercises: Standing   Row Strengthening;Both;10 reps;Theraband    Theraband Level (Row) Level 3 (Green)   3 sets   Shoulder Extension Strengthening;10 reps;Theraband   3 sets   Theraband Level (Shoulder Extension) Level 3 (Green)      Lumbar Exercises: Supine   Pelvic Tilt 10 reps    Straight Leg Raise 10 reps   x3 sets   Other Supine Lumbar Exercises 90/90 with heel tap 3x10      Lumbar Exercises: Sidelying   Clam Left;10 reps;Right   x3 sets green tband   Other Sidelying Lumbar Exercises Side plank 2x20 sec      Lumbar Exercises: Prone   Straight Leg Raise 10 reps   x3 sets each leg with green tband around knees   Other Prone Lumbar Exercises hamstring curl 3x10 green tband    Other Prone Lumbar Exercises Forearm/feet plank 2x20 sec                         PT Long Term Goals - 04/26/21 1452       PT LONG  TERM GOAL #1   Title Pt will be independent with HEP    Time 6    Period Weeks    Status New    Target Date 06/07/21      PT LONG TERM GOAL #2   Title Pt will report resolution of her bilat LE jerking sensation    Time 6    Period Weeks    Status New    Target Date 06/07/21      PT LONG TERM GOAL #3   Title Pt will be able to perform plank for at least 30 sec to demo improved core stability    Baseline unable to perform bilat LE raise in supine    Time 6    Period Weeks    Status New    Target Date 06/07/21      PT LONG TERM GOAL #4   Title Pt will be able to demonstrate good lifting mechanics of weight >/=25#    Time 6    Period Weeks    Status New    Target Date 06/07/21                   Plan - 05/02/21 1503     Clinical Impression Statement Treatment focused on initiating a strengthening HEP. No clonus noted when stretching ankles. Pt able to tolerate well.    Personal Factors and Comorbidities Age;Fitness;Social Background;Time since onset of  injury/illness/exacerbation    Examination-Activity Limitations Caring for Others;Bend;Carry    Examination-Participation Restrictions Community Activity;Cleaning    Stability/Clinical Decision Making Stable/Uncomplicated    Rehab Potential Good    PT Frequency 1x / week    PT Duration 6 weeks    PT Treatment/Interventions Therapeutic activities;Therapeutic exercise;Balance training;Neuromuscular re-education;Functional mobility training;Gait training;Manual techniques;Dry needling;Taping;Patient/family education;Traction    PT Next Visit Plan How were the exercises? Review HEP as needed. Progress core and hip strengthening (especially L hip abductors, bilat hip extensors). Please continue to monitor for any upper neuron signs.    PT Home Exercise Plan Access Code [email or web address] and Agree with Plan of Care Patient             Patient will benefit from skilled therapeutic intervention in order to improve the following deficits and impairments:  Hypermobility, Decreased mobility, Decreased strength, Postural dysfunction, Improper body mechanics, Increased muscle spasms  Visit Diagnosis: Muscle weakness (generalized)  Abnormal posture  Radiculopathy, lumbar region     Problem List Patient Active Problem List   Diagnosis Date Noted   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   Vomiting 01/19/2021   GERD (gastroesophageal reflux disease) 05/17/2019   Late period 05/12/2018   Obesity (BMI 35.0-39.9 without comorbidity) 09/09/2017   Migraine 03/04/2017   Elevated BP without diagnosis of hypertension 01/14/2017   Allergic rhinitis 09/12/2016   Anxiety and depression 01/30/2016    Thi Klich April Ma L Takuma Cifelli PT, DPT 05/02/2021, 3:31 PM  [facility] [address] [place], [place], [address] Phone: [phone]   Fax:  [phone]  Name: Melinda Cortez MRN: [id number] Date of Birth: 26-Dec-1980

## 2021-05-11 ENCOUNTER — Encounter: Payer: Self-pay | Admitting: Physical Therapy

## 2021-05-11 ENCOUNTER — Ambulatory Visit: Payer: Managed Care, Other (non HMO) | Admitting: Physical Therapy

## 2021-05-11 ENCOUNTER — Other Ambulatory Visit: Payer: Self-pay

## 2021-05-11 DIAGNOSIS — R293 Abnormal posture: Secondary | ICD-10-CM

## 2021-05-11 DIAGNOSIS — M6281 Muscle weakness (generalized): Secondary | ICD-10-CM

## 2021-05-11 NOTE — Therapy (Addendum)
[facility] [address] [place], [place], [address] Phone: [phone]   Fax:  [phone]  Physical Therapy Treatment  Patient Details  Name: Melinda Cortez MRN: [id number] Date of Birth: 1981/07/03 Referring Provider (PT): Metta Clines DO   Encounter Date: 05/11/2021   PT End of Session - 05/11/21 1323     Visit Number 3    Number of Visits 13    Date for PT Re-Evaluation 06/07/21    Authorization Type [employer]    PT Start Time 1320    PT Stop Time 1400    PT Time Calculation (min) 40 min    Activity Tolerance Patient tolerated treatment well    Behavior During Therapy [facility] for tasks assessed/performed             Past Medical History:  Diagnosis Date   Allergic rhinitis    Anxiety    Chronic headaches    HORMONAL    Complication of anesthesia    Depression    Elevated blood pressure    Elevated blood pressure affecting pregnancy in third trimester, antepartum 10/16/2017   GERD (gastroesophageal reflux disease)    Gestational hypertension 11/15/2017   PONV (postoperative nausea and vomiting)     Past Surgical History:  Procedure Laterality Date   DILATION AND CURETTAGE OF UTERUS     DILATION AND EVACUATION N/A 01/07/2017   Procedure: DILATATION AND EVACUATION;  Surgeon: Malachy Mood, MD;  Location: [facility] ORS;  Service: Gynecology;  Laterality: N/A;   TONSILLECTOMY AND ADENOIDECTOMY  1994    There were no vitals filed for this visit.   Subjective Assessment - 05/11/21 1322     Subjective No new complaints. No falls or changes to report. States no change in the LE shakes/jerks.    Pertinent History LBP, migraines    Limitations Other (comment)    Patient Stated Goals Ease up the jerking leg motions.    Currently in Pain? No/denies                   [facility] Adult PT Treatment/Exercise - 05/11/21 1324       Transfers   Transfers Sit to Stand;Stand to Sit    Sit to Stand 6: Modified  independent (Device/Increase time)    Stand to Sit 6: Modified independent (Device/Increase time)      Ambulation/Gait   Ambulation/Gait Yes    Ambulation/Gait Assistance 5: Supervision    Ambulation/Gait Assistance Details around clinic with session    Assistive device None    Gait Pattern Step-through pattern    Ambulation Surface Level;Indoor      Neuro Re-ed    Neuro Re-ed Details  for strengthening/NMR: in quadruped on mat table- alternating UE raises, alternating LE raises, alternating combo UE/LE raises for ~5 reps each side with cues for abdominal bracing and manual stability provided at pelvis; tall kneeling with UE support on red pball- mini squats for 10 reps with cues on form, then had pt rolling ball out/back in foward<>right diagonal<>left diagonal for 5 reps each direction with cues on abdominal bracing and assist/stability provided at pelvis.      Exercises   Exercises Other Exercises    Other Exercises  standing with back against doorframe: with red band 3 way pulls (horizontal abduction<>diagonal<>reverse diagonal) for 10 reps each way with cues for form and technique.      Lumbar Exercises: Aerobic   Nustep Level 3 with UE/LE's x 6 minutes with goal >/= 50  steps per minute for stengthening and activity tolerance.      Lumbar Exercises: Supine   Pelvic Tilt 10 reps;5 seconds;Limitations    Pelvic Tilt Limitations cues needed on correct technique and hold times    Bridge Non-compliant;10 reps;5 seconds;Limitations    Bridge Limitations cues for abd bracing and hold times    Bridge with [employer] Non-compliant;10 reps;5 seconds;Limitations    Bridge with [employer] Limitations with yoga block squeeze, cues for hold time                     PT Long Term Goals - 04/26/21 1452       PT LONG TERM GOAL #1   Title Pt will be independent with HEP    Time 6    Period Weeks    Status New    Target Date 06/07/21      PT LONG TERM GOAL #2   Title Pt will  report resolution of her bilat LE jerking sensation    Time 6    Period Weeks    Status New    Target Date 06/07/21      PT LONG TERM GOAL #3   Title Pt will be able to perform plank for at least 30 sec to demo improved core stability    Baseline unable to perform bilat LE raise in supine    Time 6    Period Weeks    Status New    Target Date 06/07/21      PT LONG TERM GOAL #4   Title Pt will be able to demonstrate good lifting mechanics of weight >/=25#    Time 6    Period Weeks    Status New    Target Date 06/07/21                   Plan - 05/11/21 1323     Clinical Impression Statement Today's skilled session continued to focus on core and LE strengthening/stability with no issues noted. No s/s of neurological issues with ex's performed this session. The pt is progressing and should benefit from continued PT to progress toward unmet goals.    Personal Factors and Comorbidities Age;Fitness;Social Background;Time since onset of injury/illness/exacerbation    Examination-Activity Limitations Caring for Others;Bend;Carry    Examination-Participation Restrictions Community Activity;Cleaning    Stability/Clinical Decision Making Stable/Uncomplicated    Rehab Potential Good    PT Frequency 1x / week    PT Duration 6 weeks    PT Treatment/Interventions Therapeutic activities;Therapeutic exercise;Balance training;Neuromuscular re-education;Functional mobility training;Gait training;Manual techniques;Dry needling;Taping;Patient/family education;Traction    PT Next Visit Plan Progress core and hip strengthening (especially L hip abductors, bilat hip extensors). Please continue to monitor for any upper neuron signs.    PT Home Exercise Plan Access Code [id number] and Agree with Plan of Care Patient             Patient will benefit from skilled therapeutic intervention in order to improve the following deficits and impairments:  Hypermobility, Decreased mobility,  Decreased strength, Postural dysfunction, Improper body mechanics, Increased muscle spasms  Visit Diagnosis: Muscle weakness (generalized)  Abnormal posture     Problem List Patient Active Problem List   Diagnosis Date Noted   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   Vomiting 01/19/2021   GERD (gastroesophageal reflux disease) 05/17/2019   Late period 05/12/2018   Obesity (BMI 35.0-39.9 without comorbidity) 09/09/2017   Migraine 03/04/2017   Elevated BP without  diagnosis of hypertension 01/14/2017   Allergic rhinitis 09/12/2016   Anxiety and depression 01/30/2016    Willow Ora, PTA, [facility] [address] [place], [place] [address] [phone] 05/11/21, 9:26 PM   Name: Melinda Cortez MRN: [id number] Date of Birth: 01-03-1981

## 2021-05-16 ENCOUNTER — Other Ambulatory Visit: Payer: Self-pay

## 2021-05-16 ENCOUNTER — Ambulatory Visit: Payer: Managed Care, Other (non HMO)

## 2021-05-16 ENCOUNTER — Ambulatory Visit (INDEPENDENT_AMBULATORY_CARE_PROVIDER_SITE_OTHER): Payer: 59 | Admitting: Psychology

## 2021-05-16 DIAGNOSIS — M6281 Muscle weakness (generalized): Secondary | ICD-10-CM

## 2021-05-16 DIAGNOSIS — R293 Abnormal posture: Secondary | ICD-10-CM

## 2021-05-16 DIAGNOSIS — F4323 Adjustment disorder with mixed anxiety and depressed mood: Secondary | ICD-10-CM

## 2021-05-16 NOTE — Therapy (Signed)
[facility] [address] [place], [place], [address] Phone: [phone]   Fax:  [phone]  Physical Therapy Treatment  Patient Details  Name: Melinda Cortez MRN: [id number] Date of Birth: [birth year] Referring Provider (PT): Metta Clines DO   Encounter Date: 05/16/2021   PT End of Session - 05/16/21 1447     Visit Number 4    Number of Visits 13    Date for PT Re-Evaluation 06/07/21    Authorization Type [employer]    PT Start Time 1447    PT Stop Time 1528    PT Time Calculation (min) 41 min    Activity Tolerance Patient tolerated treatment well    Behavior During Therapy [facility] for tasks assessed/performed             Past Medical History:  Diagnosis Date   Allergic rhinitis    Anxiety    Chronic headaches    HORMONAL    Complication of anesthesia    Depression    Elevated blood pressure    Elevated blood pressure affecting pregnancy in third trimester, antepartum 10/16/2017   GERD (gastroesophageal reflux disease)    Gestational hypertension 11/15/2017   PONV (postoperative nausea and vomiting)     Past Surgical History:  Procedure Laterality Date   DILATION AND CURETTAGE OF UTERUS     DILATION AND EVACUATION N/A 01/07/2017   Procedure: DILATATION AND EVACUATION;  Surgeon: Malachy Mood, MD;  Location: [facility] ORS;  Service: Gynecology;  Laterality: N/A;   TONSILLECTOMY AND ADENOIDECTOMY  1994    There were no vitals filed for this visit.   Subjective Assessment - 05/16/21 1449     Subjective Patient reports no new changes or complaints. Reports LE jerks are the same.    Pertinent History LBP, migraines    Limitations Other (comment)    Patient Stated Goals Ease up the jerking leg motions.    Currently in Pain? Yes    Pain Score 5     Pain Location Back    Pain Orientation Lower;Right;Left    Pain Descriptors / Indicators Aching    Pain Type Chronic pain    Pain Onset More than a month ago     Pain Frequency Intermittent    Aggravating Factors  bending    Pain Relieving Factors rest               [facility] Adult PT Treatment/Exercise - 05/16/21 0001       Transfers   Transfers Sit to Stand;Stand to Sit    Sit to Stand 6: Modified independent (Device/Increase time)    Stand to Sit 6: Modified independent (Device/Increase time)      Ambulation/Gait   Ambulation/Gait Yes    Ambulation/Gait Assistance 5: Supervision    Ambulation/Gait Assistance Details around therapy gym with activities    Assistive device None    Gait Pattern Step-through pattern    Ambulation Surface Level;Indoor      Self-Care   Self-Care --      Therapeutic Activites    Therapeutic Activities Lifting    Lifting Educated on bending mechanics. Practiced working on bending from hip/knees, and maintaing neutral spine. PT educaitng on maintaining load close to body to avoid overworking lumbar spine musculature, as well as avoiding trunk rotation with flexed spine.      Neuro Re-ed    Neuro Re-ed Details  for core activation/strengthening: seated on blue physioball completed pevlic tilts x 10 reps with upright posture. Maintaining  neutral spine completed alternating marching x 10 reps with BLE. Also completed lift/chops iwth BLE seated on physioball x 10 reps to each directions. Supervision throughout.      Exercises   Exercises Knee/Hip    Other Exercises  in hooklying on mat: completed 90-90 abdominal bracing with 3 second isometric hold x 10 reps. Then with green physioball under completed isometric hamstring curl into ball with bridge x 10 reps with 3 second isometric hold. Increased challenge ntoed. Then with BLE on green physioball completed trunk rotations to bilat direction x 10 reps each, cues for control and core activation. Also taught patient how to use trunk rotation for lumbar paraspinal stretch, 2 x 15 seconds.      Lumbar Exercises: Aerobic   Nustep Level 3 with UE/LE's x 5 minutes with goal  >/= 50 steps per minute for stengthening and activity tolerance.                    PT Education - 05/16/21 1534     Education Details Continue HEP    Person(s) Educated Patient    Methods Explanation    Comprehension Verbalized understanding                 PT Long Term Goals - 04/26/21 1452       PT LONG TERM GOAL #1   Title Pt will be independent with HEP    Time 6    Period Weeks    Status New    Target Date 06/07/21      PT LONG TERM GOAL #2   Title Pt will report resolution of her bilat LE jerking sensation    Time 6    Period Weeks    Status New    Target Date 06/07/21      PT LONG TERM GOAL #3   Title Pt will be able to perform plank for at least 30 sec to demo improved core stability    Baseline unable to perform bilat LE raise in supine    Time 6    Period Weeks    Status New    Target Date 06/07/21      PT LONG TERM GOAL #4   Title Pt will be able to demonstrate good lifting mechanics of weight >/=25#    Time 6    Period Weeks    Status New    Target Date 06/07/21                   Plan - 05/16/21 1540     Clinical Impression Statement Continued activities foucsed on stability and core strength to promote improved stability and musculature support of lumbar spine. No pain today with activities, tolerated all exercises well. Will continue to progress toward all LTGs.    Personal Factors and Comorbidities Age;Fitness;Social Background;Time since onset of injury/illness/exacerbation    Examination-Activity Limitations Caring for Others;Bend;Carry    Examination-Participation Restrictions Community Activity;Cleaning    Stability/Clinical Decision Making Stable/Uncomplicated    Rehab Potential Good    PT Frequency 1x / week    PT Duration 6 weeks    PT Treatment/Interventions Therapeutic activities;Therapeutic exercise;Balance training;Neuromuscular re-education;Functional mobility training;Gait training;Manual techniques;Dry  needling;Taping;Patient/family education;Traction    PT Next Visit Plan Progress core and hip strengthening (especially L hip abductors, bilat hip extensors). Please continue to monitor for any upper neuron signs.    PT Home Exercise Plan Access Code [email or web address]    Consulted and Agree with Plan of  Care Patient             Patient will benefit from skilled therapeutic intervention in order to improve the following deficits and impairments:  Hypermobility, Decreased mobility, Decreased strength, Postural dysfunction, Improper body mechanics, Increased muscle spasms  Visit Diagnosis: Muscle weakness (generalized)  Abnormal posture     Problem List Patient Active Problem List   Diagnosis Date Noted   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   Vomiting 01/19/2021   GERD (gastroesophageal reflux disease) 05/17/2019   Late period 05/12/2018   Obesity (BMI 35.0-39.9 without comorbidity) 09/09/2017   Migraine 03/04/2017   Elevated BP without diagnosis of hypertension 01/14/2017   Allergic rhinitis 09/12/2016   Anxiety and depression 01/30/2016    Jones Bales, PT, DPT 05/16/2021, 3:42 PM  [facility] [address] [place], [place], [address] Phone: [phone]   Fax:  [phone]  Name: Melinda Cortez MRN: [id number] Date of Birth: June 01, 1981

## 2021-05-20 ENCOUNTER — Other Ambulatory Visit: Payer: Self-pay | Admitting: Family Medicine

## 2021-05-24 ENCOUNTER — Other Ambulatory Visit: Payer: Self-pay

## 2021-05-24 ENCOUNTER — Ambulatory Visit: Payer: Managed Care, Other (non HMO) | Attending: Family Medicine | Admitting: Physical Therapy

## 2021-05-24 DIAGNOSIS — R293 Abnormal posture: Secondary | ICD-10-CM | POA: Insufficient documentation

## 2021-05-24 DIAGNOSIS — M6281 Muscle weakness (generalized): Secondary | ICD-10-CM

## 2021-05-25 ENCOUNTER — Encounter: Payer: Managed Care, Other (non HMO) | Admitting: Gastroenterology

## 2021-05-25 NOTE — Therapy (Signed)
[facility] [address] [place], [place], [address] Phone: [phone]   Fax:  [phone]  Physical Therapy Treatment  Patient Details  Name: Melinda Cortez MRN: [id number] Date of Birth: Mar 29, 1981 Referring Provider (PT): Metta Clines DO   Encounter Date: 05/24/2021   PT End of Session - 05/24/21 1452     Visit Number 5    Number of Visits 13    Date for PT Re-Evaluation 06/07/21    Authorization Type [employer]    PT Start Time 1449    PT Stop Time 1530    PT Time Calculation (min) 41 min    Activity Tolerance Patient tolerated treatment well;No increased pain    Behavior During Therapy WFL for tasks assessed/performed             Past Medical History:  Diagnosis Date   Allergic rhinitis    Anxiety    Chronic headaches    HORMONAL    Complication of anesthesia    Depression    Elevated blood pressure    Elevated blood pressure affecting pregnancy in third trimester, antepartum 10/16/2017   GERD (gastroesophageal reflux disease)    Gestational hypertension 11/15/2017   PONV (postoperative nausea and vomiting)     Past Surgical History:  Procedure Laterality Date   DILATION AND CURETTAGE OF UTERUS     DILATION AND EVACUATION N/A 01/07/2017   Procedure: DILATATION AND EVACUATION;  Surgeon: Malachy Mood, MD;  Location: [facility];  Service: Gynecology;  Laterality: N/A;   TONSILLECTOMY AND ADENOIDECTOMY  1994    There were no vitals filed for this visit.   Subjective Assessment - 05/24/21 1451     Subjective No falls. Reports some stiffness from sitting on lawn at concert last night.    Pertinent History LBP, migraines    Limitations Other (comment)    Patient Stated Goals Ease up the jerking leg motions.    Currently in Pain? Yes    Pain Score 6     Pain Location Back    Pain Orientation Right;Left;Lower    Pain Descriptors / Indicators Aching;Sore    Pain Type Chronic pain    Pain Onset  More than a month ago    Pain Frequency Intermittent    Aggravating Factors  certain movements such as bending, prolonged sitting    Pain Relieving Factors rest                   [facility] Adult PT Treatment/Exercise - 05/24/21 1453       Transfers   Transfers Sit to Stand;Stand to Sit    Sit to Stand 6: Modified independent (Device/Increase time)    Stand to Sit 6: Modified independent (Device/Increase time)      Ambulation/Gait   Ambulation/Gait Yes    Ambulation/Gait Assistance 5: Supervision    Assistive device None    Gait Pattern Step-through pattern    Ambulation Surface Level;Indoor      Exercises   Exercises Other Exercises    Other Exercises  hooklying on mat table: posterior pelvic tilf for 5 sec holds x 10 reps. with abdominal bracing: alternating marching for 10 reps each, table top hold (90-90) for 15 sec holds x 5 reps, then table top hold with alternating heel taps to mat for 10 reps each side. cues to maintain abdominal bracing and for general ex form/technique; with green ball under knees: bridge with arms at side for 5 sec holds x 10 reps,, lower  trunk rotation exercise for 10 reps each way. then with ball at feet- rolling ball up with HS curls while lifing pelvis up for bridge, then back out for 2 sets of 5 reps, arms at sides to assist with balance.  cues for abdominal bracing and ex form/technique. quadruped: cat<>camel for 10 reps; then plank on forearm for 20 sec holds x 3 reps, progressing to plank on forearms with alternating knee taps to mat for 2 reps each side, 3 sets done, progressing to plank on elbows for alternating hip extension with knee extension for 2 reps each side, 3 sets done. cues on form and technique with all planks. ended with childs pose for 30 sec's x 3 reps.      Lumbar Exercises: Aerobic   Nustep Level 3 with UE/LE's x 5 minutes with goal >/= 50 steps per minute for stengthening and activity tolerance.                   PT  Long Term Goals - 04/26/21 1452       PT LONG TERM GOAL #1   Title Pt will be independent with HEP    Time 6    Period Weeks    Status New    Target Date 06/07/21      PT LONG TERM GOAL #2   Title Pt will report resolution of her bilat LE jerking sensation    Time 6    Period Weeks    Status New    Target Date 06/07/21      PT LONG TERM GOAL #3   Title Pt will be able to perform plank for at least 30 sec to demo improved core stability    Baseline unable to perform bilat LE raise in supine    Time 6    Period Weeks    Status New    Target Date 06/07/21      PT LONG TERM GOAL #4   Title Pt will be able to demonstrate good lifting mechanics of weight >/=25#    Time 6    Period Weeks    Status New    Target Date 06/07/21                   Plan - 05/24/21 1452     Clinical Impression Statement Today's skilled session continued to focus on strengthening and core stability. No issues noted or reported. Pt reporting feeling better at end of session with less pain and tightness. The pt should benefit from continued PT to progress toward unmet goals.    Personal Factors and Comorbidities Age;Fitness;Social Background;Time since onset of injury/illness/exacerbation    Examination-Activity Limitations Caring for Others;Bend;Carry    Examination-Participation Restrictions Community Activity;Cleaning    Stability/Clinical Decision Making Stable/Uncomplicated    Rehab Potential Good    PT Frequency 1x / week    PT Duration 6 weeks    PT Treatment/Interventions Therapeutic activities;Therapeutic exercise;Balance training;Neuromuscular re-education;Functional mobility training;Gait training;Manual techniques;Dry needling;Taping;Patient/family education;Traction    PT Next Visit Plan Progress core and hip strengthening (especially L hip abductors, bilat hip extensors). Please continue to monitor for any upper neuron signs.    PT Home Exercise Plan Access Code [email or web address]     Consulted and Agree with Plan of Care Patient             Patient will benefit from skilled therapeutic intervention in order to improve the following deficits and impairments:  Hypermobility, Decreased mobility, Decreased strength, Postural dysfunction,  Improper body mechanics, Increased muscle spasms  Visit Diagnosis: Muscle weakness (generalized)  Abnormal posture     Problem List Patient Active Problem List   Diagnosis Date Noted   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   Vomiting 01/19/2021   GERD (gastroesophageal reflux disease) 05/17/2019   Late period 05/12/2018   Obesity (BMI 35.0-39.9 without comorbidity) 09/09/2017   Migraine 03/04/2017   Elevated BP without diagnosis of hypertension 01/14/2017   Allergic rhinitis 09/12/2016   Anxiety and depression 01/30/2016    Willow Ora, PTA, [facility] [address] [place],  [address] [phone] 05/25/21, 3:51 PM   Name: Melinda Cortez MRN: [id number] Date of Birth: 1981-09-01

## 2021-05-30 ENCOUNTER — Ambulatory Visit: Payer: Managed Care, Other (non HMO)

## 2021-06-01 ENCOUNTER — Ambulatory Visit: Payer: Managed Care, Other (non HMO)

## 2021-06-01 ENCOUNTER — Other Ambulatory Visit: Payer: Self-pay

## 2021-06-01 DIAGNOSIS — R293 Abnormal posture: Secondary | ICD-10-CM

## 2021-06-01 DIAGNOSIS — M6281 Muscle weakness (generalized): Secondary | ICD-10-CM | POA: Diagnosis not present

## 2021-06-01 NOTE — Patient Instructions (Addendum)
Access Code: [email or web address] URL: [email or web address] Date: 06/01/2021 Prepared by: Baldomero Lamy  Exercises Clamshell with Resistance - 1 x daily - 7 x weekly - 3 sets - 10 reps Supine 90/90 Alternating Heel Touches with Posterior Pelvic Tilt - 1 x daily - 7 x weekly - 3 sets - 10 reps Prone Hip Extension - 1 x daily - 7 x weekly - 3 sets - 10 reps Side Plank on Knees - 1 x daily - 7 x weekly - 3 sets - 20 sec hold Standing Bilateral Low Shoulder Row with Anchored Resistance - 1 x daily - 7 x weekly - 3 sets - 10 reps Supine Single Knee to Chest Stretch - 2 x daily - 7 x weekly - 1 sets - 3 reps - 30 seconds hold Supine Figure 4 Piriformis Stretch - 2 x daily - 7 x weekly - 1 sets - 3 reps - 30 seconds hold Seated Flexion Stretch with Swiss Ball - 1 x daily - 7 x weekly - 1 sets - 3 reps - 30 seconds hold

## 2021-06-01 NOTE — Therapy (Signed)
[facility] [address] [place], [place], [address] Phone: [phone]   Fax:  [phone]  Physical Therapy Treatment  Patient Details  Name: Melinda Cortez MRN: [id number] Date of Birth: November 06, 1980 Referring Provider (PT): Metta Clines DO   Encounter Date: 06/01/2021   PT End of Session - 06/01/21 0804     Visit Number 6    Number of Visits 13    Date for PT Re-Evaluation 06/07/21    Authorization Type [employer]    PT Start Time 0804   patient arriving late   PT Stop Time 0844    PT Time Calculation (min) 40 min    Activity Tolerance Patient tolerated treatment well;No increased pain    Behavior During Therapy WFL for tasks assessed/performed             Past Medical History:  Diagnosis Date   Allergic rhinitis    Anxiety    Chronic headaches    HORMONAL    Complication of anesthesia    Depression    Elevated blood pressure    Elevated blood pressure affecting pregnancy in third trimester, antepartum 10/16/2017   GERD (gastroesophageal reflux disease)    Gestational hypertension 11/15/2017   PONV (postoperative nausea and vomiting)     Past Surgical History:  Procedure Laterality Date   DILATION AND CURETTAGE OF UTERUS     DILATION AND EVACUATION N/A 01/07/2017   Procedure: DILATATION AND EVACUATION;  Surgeon: Malachy Mood, MD;  Location: [facility];  Service: Gynecology;  Laterality: N/A;   TONSILLECTOMY AND ADENOIDECTOMY  1994    There were no vitals filed for this visit.   Subjective Assessment - 06/01/21 0806     Subjective Patient reports she has been experiencing some achiness and along with pinching in the R low back region and then radiates into the R leg. Patient reports that she feels like she needs to pop something.    Pertinent History LBP, migraines    Limitations Other (comment)    Patient Stated Goals Ease up the jerking leg motions.    Currently in Pain? Yes    Pain Score 5      Pain Location Back    Pain Orientation Lower;Right    Pain Descriptors / Indicators Aching;Other (Comment)   Pinching   Pain Type Chronic pain    Pain Onset More than a month ago    Aggravating Factors  certain movements; seated position    Pain Relieving Factors supine or ambulation               [facility] Adult PT Treatment/Exercise - 06/01/21 0001       Transfers   Transfers Sit to Stand;Stand to Sit    Sit to Stand 6: Modified independent (Device/Increase time)    Stand to Sit 6: Modified independent (Device/Increase time)      Ambulation/Gait   Ambulation/Gait Yes    Ambulation/Gait Assistance 6: Modified independent (Device/Increase time)    Ambulation/Gait Assistance Details into/out of therapy session    Assistive device None    Gait Pattern Step-through pattern    Ambulation Surface Level;Indoor      Exercises   Exercises Other Exercises    Other Exercises  Due to pain in R Hip/Low Back completed hooklying piriformis stretch on R side, 3 x 30 seconds with pt providing gentle pressure to R knee to tolerance. Then completed single knee to chest with RLE, 3 x 30 seconds to patient's tolerance. Completed cat <>  camel stretch x 10 reps with cues for coordinating inhale/exhale with completion for improved relaxation and stretch. After cat <> mat transitioned into childs pose and completed 3 x 30 seconds. Trialed quadruped thoracic rotation with thread the needle, completed x 5 reps to bilateral directions. In seated position: completed seated flexion stretch straight forwards with blue physioball 3 x 30 seconds, then added in rotation component and completed to left side for improved stretch throughout R Hip/Trunk region completed 3 x 30 seconds.             Updated HEP to included bolded additions for stretches to complete at home. Patient tolerating well. Updated Handout provided.  Access Code: [email or web address] URL: [email or web address] Date: 06/01/2021 Prepared  by: Baldomero Lamy  Exercises Clamshell with Resistance - 1 x daily - 7 x weekly - 3 sets - 10 reps Supine 90/90 Alternating Heel Touches with Posterior Pelvic Tilt - 1 x daily - 7 x weekly - 3 sets - 10 reps Prone Hip Extension - 1 x daily - 7 x weekly - 3 sets - 10 reps Side Plank on Knees - 1 x daily - 7 x weekly - 3 sets - 20 sec hold Standing Bilateral Low Shoulder Row with Anchored Resistance - 1 x daily - 7 x weekly - 3 sets - 10 reps Supine Single Knee to Chest Stretch - 2 x daily - 7 x weekly - 1 sets - 3 reps - 30 seconds hold Supine Figure 4 Piriformis Stretch - 2 x daily - 7 x weekly - 1 sets - 3 reps - 30 seconds hold Seated Flexion Stretch with Swiss Ball - 2 x daily - 7 x weekly - 1 sets - 3 reps - 30 seconds hold        PT Education - 06/01/21 0848     Education Details HEP update    Person(s) Educated Patient    Methods Explanation;Demonstration;Handout    Comprehension Verbalized understanding;Returned demonstration                 PT Long Term Goals - 04/26/21 1452       PT LONG TERM GOAL #1   Title Pt will be independent with HEP    Time 6    Period Weeks    Status New    Target Date 06/07/21      PT LONG TERM GOAL #2   Title Pt will report resolution of her bilat LE jerking sensation    Time 6    Period Weeks    Status New    Target Date 06/07/21      PT LONG TERM GOAL #3   Title Pt will be able to perform plank for at least 30 sec to demo improved core stability    Baseline unable to perform bilat LE raise in supine    Time 6    Period Weeks    Status New    Target Date 06/07/21      PT LONG TERM GOAL #4   Title Pt will be able to demonstrate good lifting mechanics of weight >/=25#    Time 6    Period Weeks    Status New    Target Date 06/07/21                   Plan - 06/01/21 0853     Clinical Impression Statement Due to recent pain in R hip and low back region completed extensive exercises focused on stretching to  patient's tolerance and working into tolerable range. Patient tolerating well and reporting less pain and improvement in symptoms at end of session. Updated HEP to include stretching for patient to complete at home. Will continue to progress toward all LTGs.    Personal Factors and Comorbidities Age;Fitness;Social Background;Time since onset of injury/illness/exacerbation    Examination-Activity Limitations Caring for Others;Bend;Carry    Examination-Participation Restrictions Community Activity;Cleaning    Stability/Clinical Decision Making Stable/Uncomplicated    Rehab Potential Good    PT Frequency 1x / week    PT Duration 6 weeks    PT Treatment/Interventions Therapeutic activities;Therapeutic exercise;Balance training;Neuromuscular re-education;Functional mobility training;Gait training;Manual techniques;Dry needling;Taping;Patient/family education;Traction    PT Next Visit Plan Check LTG + D/C or Re-Cert. Progress core and hip strengthening (especially L hip abductors, bilat hip extensors). Please continue to monitor for any upper neuron signs.    PT Home Exercise Plan Access Code [email or web address] and Agree with Plan of Care Patient             Patient will benefit from skilled therapeutic intervention in order to improve the following deficits and impairments:  Hypermobility, Decreased mobility, Decreased strength, Postural dysfunction, Improper body mechanics, Increased muscle spasms  Visit Diagnosis: Muscle weakness (generalized)  Abnormal posture     Problem List Patient Active Problem List   Diagnosis Date Noted   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   Vomiting 01/19/2021   GERD (gastroesophageal reflux disease) 05/17/2019   Late period 05/12/2018   Obesity (BMI 35.0-39.9 without comorbidity) 09/09/2017   Migraine 03/04/2017   Elevated BP without diagnosis of hypertension 01/14/2017   Allergic rhinitis 09/12/2016   Anxiety and depression 01/30/2016    Jones Bales, PT, DPT 06/01/2021, 8:57 AM  [facility] [address] [place], [place], [address] Phone: [phone]   Fax:  [phone]  Name: MARGARETANN LEVERING MRN: [id number] Date of Birth: [birth year]

## 2021-06-06 ENCOUNTER — Encounter: Payer: Self-pay | Admitting: Physical Therapy

## 2021-06-06 ENCOUNTER — Other Ambulatory Visit: Payer: Self-pay

## 2021-06-06 ENCOUNTER — Ambulatory Visit: Payer: Managed Care, Other (non HMO) | Admitting: Physical Therapy

## 2021-06-06 DIAGNOSIS — M6281 Muscle weakness (generalized): Secondary | ICD-10-CM | POA: Diagnosis not present

## 2021-06-06 DIAGNOSIS — R293 Abnormal posture: Secondary | ICD-10-CM

## 2021-06-06 NOTE — Patient Instructions (Signed)
Access Code: [email or web address] URL: [email or web address] Date: 06/06/2021 Prepared by: Willow Ora  Changes/new ex's in bold Exercises Clamshell with Resistance - 1 x daily - 7 x weekly - 3 sets - 10 reps- advanced to blue band Supine 90/90 Alternating Heel Touches with Posterior Pelvic Tilt - 1 x daily - 7 x weekly - 3 sets - 10 reps 1. in table top with ball between legs- pass the ball to your arms, open up into a "V" and then bring the ball back between knees. Repeat. 2. with ball between feet in table top- dribble the ball catching between feet. Prone Hip Extension - 1 x daily - 7 x weekly - 3 sets - 10 reps- advanced to blue band Side Plank on Knees - 1 x daily - 7 x weekly - 3 sets - 20 sec hold increase time of hold as it gets easier to progress the ex Standing Bilateral Low Shoulder Row with Anchored Resistance - 1 x daily - 7 x weekly - 3 sets - 10 reps advanced to blue band Supine Single Knee to Chest Stretch - 2 x daily - 7 x weekly - 1 sets - 3 reps - 30 seconds hold Supine Figure 4 Piriformis Stretch - 2 x daily - 7 x weekly - 1 sets - 3 reps - 30 seconds hold Seated Flexion Stretch with Swiss Ball - 2 x daily - 7 x weekly - 1 sets - 3 reps - 30 seconds hold

## 2021-06-07 NOTE — Therapy (Addendum)
[facility] [address] [place], [place], [address] Phone: [phone]   Fax:  [phone]  Physical Therapy Treatment/Discharge Summary  Patient Details  Name: Melinda Cortez MRN: [id number] Date of Birth: 1980/12/03 Referring Provider (PT): Metta Clines DO  PHYSICAL THERAPY DISCHARGE SUMMARY  Visits from Start of Care: 7  Current functional level related to goals / functional outcomes: See Clinical Impression Statement   Remaining deficits: LE Jerking Sensation   Education / Equipment: HEP Provided, Follow up with Neurology   Patient agrees to discharge. Patient goals were met. Patient is being discharged due to meeting the stated rehab goals.   Encounter Date: 06/06/2021   PT End of Session - 06/06/21 1322     Visit Number 7    Number of Visits 13    Date for PT Re-Evaluation 06/07/21    Authorization Type [employer]    PT Start Time 1319    PT Stop Time 1400    PT Time Calculation (min) 41 min    Activity Tolerance Patient tolerated treatment well;No increased pain    Behavior During Therapy WFL for tasks assessed/performed             Past Medical History:  Diagnosis Date   Allergic rhinitis    Anxiety    Chronic headaches    HORMONAL    Complication of anesthesia    Depression    Elevated blood pressure    Elevated blood pressure affecting pregnancy in third trimester, antepartum 10/16/2017   GERD (gastroesophageal reflux disease)    Gestational hypertension 11/15/2017   PONV (postoperative nausea and vomiting)     Past Surgical History:  Procedure Laterality Date   DILATION AND CURETTAGE OF UTERUS     DILATION AND EVACUATION N/A 01/07/2017   Procedure: DILATATION AND EVACUATION;  Surgeon: Malachy Mood, MD;  Location: [facility] ORS;  Service: Gynecology;  Laterality: N/A;   TONSILLECTOMY AND ADENOIDECTOMY  1994    There were no vitals filed for this visit.   Subjective Assessment -  06/06/21 1320     Subjective No new complaints. No falls. Leg spasms continue at random times.    Pertinent History LBP, migraines    Limitations Other (comment)    Patient Stated Goals Ease up the jerking leg motions.    Currently in Pain? Yes    Pain Score 3     Pain Location Back    Pain Orientation Right;Lower    Pain Descriptors / Indicators Aching;Other (Comment)   pinching                [facility] Adult PT Treatment/Exercise - 06/06/21 1324       Transfers   Transfers Sit to Stand;Stand to Sit    Sit to Stand 6: Modified independent (Device/Increase time)    Stand to Sit 6: Modified independent (Device/Increase time)      Ambulation/Gait   Ambulation/Gait Yes    Ambulation/Gait Assistance 6: Modified independent (Device/Increase time)    Ambulation/Gait Assistance Details around gym with session    Assistive device None    Gait Pattern Step-through pattern    Ambulation Surface Level;Indoor      Therapeutic Activites    Therapeutic Activities Lifting    Lifting floor<>standing lifting crate: 10# x 3 reps,  15# x 3,  25# x 3 reps with good form      Lumbar Exercises: Supine   Pelvic Tilt 10 reps;5 seconds;Limitations    Pelvic Tilt Limitations cues for  hold time    Bridge with [employer] Non-compliant;10 reps;5 seconds;Limitations    Bridge with [employer] Limitations with yoga block squeeze, cues for hold time    Other Supine Lumbar Exercises hooklying with yoga block between knees: ball pass from knees<>hands going into "V" and then passing ball back to between knees for 5 reps; with ball between knees/LE's in table top position- bounding ball on mat<>catching ball between legs for 2 sets of 5 reps.      Lumbar Exercises: Prone   Other Prone Lumbar Exercises childs pose for 30 sec hold <> cat/camel for 5 reps x 2 rep each in this sequence.    Other Prone Lumbar Exercises Forearm/feet plank : 15 sec's, 20 sec's, 30 sec's x 1 rep each.             Issued  to HEP today:  Access Code: [email or web address] URL: [email or web address] Date: 06/06/2021 Prepared by: Willow Ora   Changes/new ex's in bold Exercises Clamshell with Resistance - 1 x daily - 7 x weekly - 3 sets - 10 reps- advanced to blue band Supine 90/90 Alternating Heel Touches with Posterior Pelvic Tilt - 1 x daily - 7 x weekly - 3 sets - 10 reps 1. in table top with ball between legs- pass the ball to your arms, open up into a "V" and then bring the ball back between knees. Repeat. 2. with ball between feet in table top- dribble the ball catching between feet. Prone Hip Extension - 1 x daily - 7 x weekly - 3 sets - 10 reps- advanced to blue band Side Plank on Knees - 1 x daily - 7 x weekly - 3 sets - 20 sec hold increase time of hold as it gets easier to progress the ex Standing Bilateral Low Shoulder Row with Anchored Resistance - 1 x daily - 7 x weekly - 3 sets - 10 reps advanced to blue band Supine Single Knee to Chest Stretch - 2 x daily - 7 x weekly - 1 sets - 3 reps - 30 seconds hold Supine Figure 4 Piriformis Stretch - 2 x daily - 7 x weekly - 1 sets - 3 reps - 30 seconds hold Seated Flexion Stretch with Swiss Ball - 2 x daily - 7 x weekly - 1 sets - 3 reps - 30 seconds hold           PT Long Term Goals - 06/06/21 1322       PT LONG TERM GOAL #1   Title Pt will be independent with HEP    Baseline 06/06/21: met in session today    Time --    Period --    Status Achieved      PT LONG TERM GOAL #2   Title Pt will report resolution of her bilat LE jerking sensation    Baseline 06/06/21: stil having the jerking sensations. Being referred back to MD for further testing by PT.    Time --    Period --    Status Not Met      PT LONG TERM GOAL #3   Title Pt will be able to perform plank for at least 30 sec to demo improved core stability    Baseline 06/06/21: met in session today    Time --    Period --    Status Achieved      PT LONG TERM GOAL #4   Title Pt  will be able to demonstrate good lifting mechanics  of weight >/=25#    Baseline 06/06/21: met in session today    Time --    Period --    Status Achieved                   Plan - 06/06/21 1322     Clinical Impression Statement Today's skilled session focused on progress toward LTGs with all goals met except for goal #2. Pt continues to have LE tremors/jerking movements. Pt is to follow up with referring MD due to therapy not helping with this issue. Pt is in agreement to discharge today.    Personal Factors and Comorbidities Age;Fitness;Social Background;Time since onset of injury/illness/exacerbation    Examination-Activity Limitations Caring for Others;Bend;Carry    Examination-Participation Restrictions Community Activity;Cleaning    Stability/Clinical Decision Making Stable/Uncomplicated    Rehab Potential Good    PT Frequency 1x / week    PT Duration 6 weeks    PT Treatment/Interventions Therapeutic activities;Therapeutic exercise;Balance training;Neuromuscular re-education;Functional mobility training;Gait training;Manual techniques;Dry needling;Taping;Patient/family education;Traction    PT Next Visit Plan discharge today.    PT Home Exercise Plan Access Code [email or web address]    Consulted and Agree with Plan of Care Patient             Patient will benefit from skilled therapeutic intervention in order to improve the following deficits and impairments:  Hypermobility, Decreased mobility, Decreased strength, Postural dysfunction, Improper body mechanics, Increased muscle spasms  Visit Diagnosis: Muscle weakness (generalized)  Abnormal posture     Problem List Patient Active Problem List   Diagnosis Date Noted   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   Vomiting 01/19/2021   GERD (gastroesophageal reflux disease) 05/17/2019   Late period 05/12/2018   Obesity (BMI 35.0-39.9 without comorbidity) 09/09/2017   Migraine 03/04/2017   Elevated BP without diagnosis of  hypertension 01/14/2017   Allergic rhinitis 09/12/2016   Anxiety and depression 01/30/2016   Willow Ora, PTA, [facility] [address] [place], [place] [address] [phone] 06/07/21, 4:13 PM   Name: ARVILLA SALADA MRN: [id number] Date of Birth: 09-01-1981  Addendum:  Jones Bales, PT, [facility] [address] [place], [place]  [address] Phone:  [phone] Fax:  [phone]

## 2021-06-14 ENCOUNTER — Other Ambulatory Visit: Payer: Self-pay

## 2021-06-14 MED ORDER — PROPRANOLOL HCL ER 80 MG PO CP24
80.0000 mg | ORAL_CAPSULE | Freq: Every day | ORAL | 1 refills | Status: DC
Start: 2021-06-14 — End: 2021-10-22

## 2021-06-14 NOTE — Telephone Encounter (Signed)
Updated Pharmacy, Script sent to [facility].

## 2021-06-18 ENCOUNTER — Other Ambulatory Visit: Payer: Self-pay

## 2021-06-18 DIAGNOSIS — R200 Anesthesia of skin: Secondary | ICD-10-CM

## 2021-06-18 DIAGNOSIS — M5416 Radiculopathy, lumbar region: Secondary | ICD-10-CM

## 2021-06-18 NOTE — Progress Notes (Signed)
If she has failed physical therapy, would get MRI of lumbar spine without contrast for bilateral lumbar radiculopathy,  like to check B12 and ferritin level for bilateral leg numbness.

## 2021-06-18 NOTE — Addendum Note (Signed)
Addended by: Venetia Night on: 06/18/2021 04:22 PM   Modules accepted: Orders

## 2021-06-18 NOTE — Progress Notes (Signed)
Per pt please send Lab order to [facility] at [phone].

## 2021-06-20 LAB — VITAMIN B12: Vitamin B-12: 560 pg/mL (ref 232–1245)

## 2021-06-20 LAB — FERRITIN: Ferritin: 60 ng/mL (ref 15–150)

## 2021-06-30 ENCOUNTER — Ambulatory Visit
Admission: RE | Admit: 2021-06-30 | Discharge: 2021-06-30 | Disposition: A | Discharge status: Home / Self Care | Payer: Managed Care, Other (non HMO) | Source: Ambulatory Visit | Attending: Neurology | Admitting: Neurology

## 2021-06-30 DIAGNOSIS — M5416 Radiculopathy, lumbar region: Secondary | ICD-10-CM

## 2021-07-05 ENCOUNTER — Other Ambulatory Visit: Payer: Self-pay

## 2021-07-05 DIAGNOSIS — M5416 Radiculopathy, lumbar region: Secondary | ICD-10-CM

## 2021-07-05 DIAGNOSIS — R200 Anesthesia of skin: Secondary | ICD-10-CM

## 2021-07-05 NOTE — Progress Notes (Signed)
Per Dr.Jaffe, There are some disc bulges that may be cause of her leg pain.  I would like to get the opinion from neurosurgery.  Referral for Neurosurgery added

## 2021-07-30 ENCOUNTER — Other Ambulatory Visit: Payer: Self-pay | Admitting: Family Medicine

## 2021-08-03 ENCOUNTER — Other Ambulatory Visit: Payer: Self-pay | Admitting: Gastroenterology

## 2021-08-27 ENCOUNTER — Encounter: Payer: Self-pay | Admitting: Urgent Care

## 2021-08-27 ENCOUNTER — Ambulatory Visit
Admission: EM | Admit: 2021-08-27 | Discharge: 2021-08-27 | Disposition: A | Discharge status: Home / Self Care | Payer: Managed Care, Other (non HMO) | Attending: Urgent Care | Admitting: Urgent Care

## 2021-08-27 DIAGNOSIS — R52 Pain, unspecified: Secondary | ICD-10-CM

## 2021-08-27 DIAGNOSIS — B349 Viral infection, unspecified: Secondary | ICD-10-CM

## 2021-08-27 DIAGNOSIS — R070 Pain in throat: Secondary | ICD-10-CM

## 2021-08-27 MED ORDER — PROMETHAZINE-DM 6.25-15 MG/5ML PO SYRP: 5.0000 mL | ORAL_SOLUTION | Freq: Every evening | ORAL | 0 refills | Status: DC | PRN

## 2021-08-27 MED ORDER — PSEUDOEPHEDRINE HCL 30 MG PO TABS: 30.0000 mg | ORAL_TABLET | Freq: Three times a day (TID) | ORAL | 0 refills | Status: DC | PRN

## 2021-08-27 MED ORDER — CETIRIZINE HCL 10 MG PO TABS: 10.0000 mg | ORAL_TABLET | Freq: Every day | ORAL | 0 refills | Status: AC

## 2021-08-27 MED ORDER — BENZONATATE 100 MG PO CAPS: 100.0000 mg | ORAL_CAPSULE | Freq: Three times a day (TID) | ORAL | 0 refills | Status: DC | PRN

## 2021-08-27 NOTE — Discharge Instructions (Signed)
We will notify you of your test results as they arrive and may take between 48-72 hours.  I encourage you to sign up for MyChart if you have not already done so as this can be the easiest way for [place] to communicate results to you online or through a phone app.  Generally, we only contact you if it is a positive test result.  In the meantime, if you develop worsening symptoms including fever, chest pain, shortness of breath despite our current treatment plan then please report to the emergency room as this may be a sign of worsening status from possible viral infection.  Otherwise, we will manage this as a viral syndrome. For sore throat or cough try using a honey-based tea. Use 3 teaspoons of honey with juice squeezed from half lemon. Place shaved pieces of ginger into 1/2-1 cup of water and warm over stove top. Then mix the ingredients and repeat every 4 hours as needed. Please take Tylenol 500mg -650mg  every 6 hours for aches and pains, fevers. Hydrate very well with at least 2 liters of water. Eat light meals such as soups to replenish electrolytes and soft fruits, veggies. Start an antihistamine like Zyrtec for postnasal drainage, sinus congestion.  You can take this together with pseudoephedrine (Sudafed) at a dose of 30 mg 2-3 times a day as needed for the same kind of congestion.  Use the cough capsules and cough syrup as needed for relief from your cough.

## 2021-08-27 NOTE — ED Provider Notes (Signed)
Renae Gloss   MRN: [id number] DOB: October 10, 1980  Subjective:   Melinda Cortez is a 40 y.o. female presenting for 5-day history of persistent sinus congestion, headaches, body aches, coughing, throat pain, malaise and fatigue.  Patient has 2 children who tested positive for influenza.  Would like to be evaluated for this.  No chest pain, shortness of breath or wheezing.  No history of asthma.  Patient is not a smoker.  No drug use.  No history of immunocompromising conditions.  No current facility-administered medications for this encounter.  Current Outpatient Medications:    acetaminophen (TYLENOL) 500 MG tablet, Take 500 mg by mouth every 6 (six) hours as needed., Disp: , Rfl:    buPROPion (WELLBUTRIN XL) 300 MG 24 hr tablet, TAKE 1 TABLET BY MOUTH  DAILY, Disp: 90 tablet, Rfl: 3   cetirizine (ZYRTEC) 10 MG tablet, Take 10 mg by mouth every morning. , Disp: , Rfl:    clobetasol cream (TEMOVATE) 6.97 %, Apply 1 application topically 2 (two) times daily., Disp: 60 each, Rfl: 1   diphenhydrAMINE (BENADRYL) 25 MG tablet, Take 25 mg by mouth at bedtime as needed for sleep., Disp: , Rfl:    hydrOXYzine (ATARAX/VISTARIL) 10 MG tablet, Take 1 tablet (10 mg total) by mouth 3 (three) times daily as needed for anxiety. (Patient not taking: Reported on 04/13/2021), Disp: 30 tablet, Rfl: 0   ibuprofen (ADVIL,MOTRIN) 600 MG tablet, Take 1 tablet (600 mg total) by mouth every 6 (six) hours as needed for mild pain, moderate pain or cramping., Disp: 30 tablet, Rfl: 0   omeprazole (PRILOSEC) 40 MG capsule, TAKE 1 CAPSULE BY MOUTH  DAILY, Disp: 90 capsule, Rfl: 3   ondansetron (ZOFRAN ODT) 4 MG disintegrating tablet, Take 1 tablet (4 mg total) by mouth every 8 (eight) hours as needed for nausea or vomiting., Disp: 20 tablet, Rfl: 5   Prenatal Vit-Fe Fumarate-FA (PRENATAL MULTIVITAMIN) TABS tablet, Take 1 tablet by mouth daily. , Disp: , Rfl:    propranolol ER (INDERAL LA) 80 MG 24 hr capsule,  Take 1 capsule (80 mg total) by mouth daily., Disp: 90 capsule, Rfl: 1   rizatriptan (MAXALT-MLT) 10 MG disintegrating tablet, Take 1 tablet earliest onset of migraine.  May repeat in 2 hours if needed.  Maximum 2 tablets in 24 hours, Disp: 10 tablet, Rfl: 5   sertraline (ZOLOFT) 100 MG tablet, TAKE 1 TABLET BY MOUTH  DAILY, Disp: 90 tablet, Rfl: 3   sodium chloride (OCEAN) 0.65 % SOLN nasal spray, Place 1 spray into both nostrils as needed for congestion. (Patient not taking: Reported on 04/13/2021), Disp: , Rfl:    No Known Allergies  Past Medical History:  Diagnosis Date   Allergic rhinitis    Anxiety    Chronic headaches    HORMONAL    Complication of anesthesia    Depression    Elevated blood pressure    Elevated blood pressure affecting pregnancy in third trimester, antepartum 10/16/2017   GERD (gastroesophageal reflux disease)    Gestational hypertension 11/15/2017   PONV (postoperative nausea and vomiting)      Past Surgical History:  Procedure Laterality Date   DILATION AND CURETTAGE OF UTERUS     DILATION AND EVACUATION N/A 01/07/2017   Procedure: DILATATION AND EVACUATION;  Surgeon: Malachy Mood, MD;  Location: [facility] ORS;  Service: Gynecology;  Laterality: N/A;   TONSILLECTOMY AND ADENOIDECTOMY  1994    Family History  Problem Relation Age of Onset   Alcoholism Other  Breast cancer Maternal Aunt 36       Mothers aunt   Lung cancer Other    Hypertension Father    Heart disease Father    Hypertension Brother    Hypertension Brother        prediabetes   Lymphoma Paternal Grandfather    Schizophrenia Maternal Uncle     Social History   Tobacco Use   Smoking status: Former    Packs/day: 0.25    Years: 16.00    Pack years: 4.00    Types: Cigarettes    Quit date: 01/07/2015    Years since quitting: 6.6   Smokeless tobacco: Never  Vaping Use   Vaping Use: Never used  Substance Use Topics   Alcohol use: Yes    Alcohol/week: 0.0 standard drinks     Comment: rare   Drug use: No    ROS   Objective:   Vitals: BP 138/88 (BP Location: Left Arm)   Pulse 67   Temp 98.7 F (37.1 C) (Oral)   Resp 18   SpO2 95%   Physical Exam Constitutional:      General: She is not in acute distress.    Appearance: Normal appearance. She is well-developed. She is not ill-appearing, toxic-appearing or diaphoretic.  HENT:     Head: Normocephalic and atraumatic.     Right Ear: Tympanic membrane, ear canal and external ear normal. No drainage or tenderness. No middle ear effusion. There is no impacted cerumen. Tympanic membrane is not erythematous.     Left Ear: Tympanic membrane, ear canal and external ear normal. No drainage or tenderness.  No middle ear effusion. There is no impacted cerumen. Tympanic membrane is not erythematous.     Nose: Nose normal. No congestion or rhinorrhea.     Mouth/Throat:     Mouth: Mucous membranes are moist. No oral lesions.     Pharynx: No pharyngeal swelling, oropharyngeal exudate, posterior oropharyngeal erythema or uvula swelling.     Tonsils: No tonsillar exudate or tonsillar abscesses.  Eyes:     General: No scleral icterus.       Right eye: No discharge.        Left eye: No discharge.     Extraocular Movements: Extraocular movements intact.     Right eye: Normal extraocular motion.     Left eye: Normal extraocular motion.     Conjunctiva/sclera: Conjunctivae normal.     Pupils: Pupils are equal, round, and reactive to light.  Cardiovascular:     Rate and Rhythm: Normal rate and regular rhythm.     Pulses: Normal pulses.     Heart sounds: Normal heart sounds. No murmur heard.   No friction rub. No gallop.  Pulmonary:     Effort: Pulmonary effort is normal. No respiratory distress.     Breath sounds: Normal breath sounds. No stridor. No wheezing, rhonchi or rales.  Musculoskeletal:     Cervical back: Normal range of motion and neck supple.  Lymphadenopathy:     Cervical: No cervical adenopathy.   Skin:    General: Skin is warm and dry.     Findings: No rash.  Neurological:     General: No focal deficit present.     Mental Status: She is alert and oriented to person, place, and time.  Psychiatric:        Mood and Affect: Mood normal.        Behavior: Behavior normal.        Thought Content: Thought  content normal.        Judgment: Judgment normal.    Assessment and Plan :   PDMP not reviewed this encounter.  1. Acute viral syndrome   2. Viral illness   3. Body aches   4. Throat pain    COVID and flu test pending.  We will otherwise manage for viral syndrome likely the flu due to her exposure.  Physical exam findings reassuring and vital signs stable for discharge. Deferred imaging given clear cardiopulmonary exam, hemodynamically stable vital signs. Advised supportive care, offered symptomatic relief. Counseled patient on potential for adverse effects with medications prescribed/recommended today, ER and return-to-clinic precautions discussed, patient verbalized understanding.       Jaynee Eagles, PA-C 08/27/21 1702

## 2021-08-27 NOTE — ED Triage Notes (Signed)
Pt here with flu-like sx x 5 days with direct exposure from children at home. She denies fevers.

## 2021-08-28 LAB — COVID-19, FLU A+B NAA
Influenza A, NAA: DETECTED — AB
Influenza B, NAA: NOT DETECTED
SARS-CoV-2, NAA: NOT DETECTED

## 2021-08-29 ENCOUNTER — Other Ambulatory Visit: Payer: Self-pay

## 2021-08-29 ENCOUNTER — Ambulatory Visit: Payer: 59 | Admitting: Psychology

## 2021-08-29 MED ORDER — RIZATRIPTAN BENZOATE 10 MG PO TBDP: ORAL_TABLET | ORAL | 1 refills | Status: DC

## 2021-08-30 ENCOUNTER — Encounter: Payer: Self-pay | Admitting: Emergency Medicine

## 2021-08-30 ENCOUNTER — Ambulatory Visit
Admission: EM | Admit: 2021-08-30 | Discharge: 2021-08-30 | Disposition: A | Discharge status: Home / Self Care | Payer: Managed Care, Other (non HMO) | Attending: Emergency Medicine | Admitting: Emergency Medicine

## 2021-08-30 ENCOUNTER — Other Ambulatory Visit: Payer: Self-pay

## 2021-08-30 DIAGNOSIS — J101 Influenza due to other identified influenza virus with other respiratory manifestations: Secondary | ICD-10-CM | POA: Diagnosis not present

## 2021-08-30 DIAGNOSIS — J029 Acute pharyngitis, unspecified: Secondary | ICD-10-CM | POA: Diagnosis not present

## 2021-08-30 DIAGNOSIS — R051 Acute cough: Secondary | ICD-10-CM

## 2021-08-30 MED ORDER — AZITHROMYCIN 250 MG PO TABS: 250.0000 mg | ORAL_TABLET | Freq: Every day | ORAL | 0 refills | Status: DC

## 2021-08-30 NOTE — ED Provider Notes (Signed)
Roderic Palau    CSN: [id number] Arrival date & time: 08/30/21  0802      History   Chief Complaint Chief Complaint  Patient presents with   Cough   Sore Throat     HPI Melinda Cortez is a 40 y.o. female.  Patient presents with ongoing sore throat and nonproductive cough x 8 days.   She denies fever, chills, shortness of breath, wheezing, vomiting, diarrhea, or other symptoms.  Patient was seen at this urgent care on 08/27/2021; diagnosed with viral syndrome; treated with Tessalon Perles, promethazine-DM, pseudoephedrine; tests came back positive for Influenza A.    The history is provided by the patient and medical records.   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   Vomiting 01/19/2021   GERD (gastroesophageal reflux disease) 05/17/2019   Late period 05/12/2018   Obesity (BMI 35.0-39.9 without comorbidity) 09/09/2017   Migraine 03/04/2017   Elevated BP without diagnosis of hypertension 01/14/2017   Allergic rhinitis 09/12/2016   Anxiety and depression 01/30/2016    Past Surgical History:  Procedure Laterality Date   DILATION AND CURETTAGE OF UTERUS     DILATION AND EVACUATION N/A 01/07/2017   Procedure: DILATATION AND EVACUATION;  Surgeon: Malachy Mood, MD;  Location: [facility] ORS;  Service: Gynecology;  Laterality: N/A;   TONSILLECTOMY AND ADENOIDECTOMY  1994    OB History     Gravida  3   Para  2   Term  2   Preterm      AB  1   Living  2      SAB  1   IAB      Ectopic      Multiple  0   Live  Births  2            Home Medications    Prior to Admission medications   Medication Sig Start Date End Date Taking? Authorizing Provider  azithromycin (ZITHROMAX) 250 MG tablet Take 1 tablet (250 mg total) by mouth daily. Take first 2 tablets together, then 1 every day until finished. 08/30/21  Yes Sharion Balloon, NP  acetaminophen (TYLENOL) 500 MG tablet Take 500 mg by mouth every 6 (six) hours as needed.    [provider]  benzonatate (TESSALON) 100 MG capsule Take 1-2 capsules (100-200 mg total) by mouth 3 (three) times daily as needed for cough. 08/27/21   Jaynee Eagles, PA-C  buPROPion (WELLBUTRIN XL) 300 MG 24 hr tablet TAKE 1 TABLET BY MOUTH  DAILY 07/30/21   Leone Haven, MD  cetirizine (ZYRTEC ALLERGY) 10 MG tablet Take 1 tablet (10 mg total) by mouth daily. 08/27/21   Jaynee Eagles, PA-C  clobetasol cream (TEMOVATE) 9.51 % Apply 1 application topically 2 (two) times daily. 03/17/20   Malachy Mood, MD  diphenhydrAMINE (BENADRYL) 25 MG tablet Take 25 mg by mouth at bedtime as needed for sleep.    [provider]  hydrOXYzine (ATARAX/VISTARIL) 10 MG tablet Take 1 tablet (10 mg total) by mouth  3 (three) times daily as needed for anxiety. Patient not taking: Reported on 04/13/2021 05/05/20   Leone Haven, MD  ibuprofen (ADVIL,MOTRIN) 600 MG tablet Take 1 tablet (600 mg total) by mouth every 6 (six) hours as needed for mild pain, moderate pain or cramping. 11/18/17   Malachy Mood, MD  omeprazole (PRILOSEC) 40 MG capsule TAKE 1 CAPSULE BY MOUTH  DAILY 08/06/21   Armbruster, Carlota Raspberry, MD  ondansetron (ZOFRAN ODT) 4 MG disintegrating tablet Take 1 tablet (4 mg total) by mouth every 8 (eight) hours as needed for nausea or vomiting. 04/13/21   Pieter Partridge, DO  Prenatal Vit-Fe Fumarate-FA (PRENATAL MULTIVITAMIN) TABS tablet Take 1 tablet by mouth daily.     [provider]  promethazine-dextromethorphan (PROMETHAZINE-DM) 6.25-15 MG/5ML syrup Take 5 mLs by  mouth at bedtime as needed for cough. 08/27/21   Jaynee Eagles, PA-C  propranolol ER (INDERAL LA) 80 MG 24 hr capsule Take 1 capsule (80 mg total) by mouth daily. 06/14/21   Pieter Partridge, DO  pseudoephedrine (SUDAFED) 30 MG tablet Take 1 tablet (30 mg total) by mouth every 8 (eight) hours as needed for congestion. 08/27/21   Jaynee Eagles, PA-C  rizatriptan (MAXALT-MLT) 10 MG disintegrating tablet Take 1 tablet earliest onset of migraine.  May repeat in 2 hours if needed.  Maximum 2 tablets in 24 hours 08/29/21   Metta Clines R, DO  sertraline (ZOLOFT) 100 MG tablet TAKE 1 TABLET BY MOUTH  DAILY 03/14/21   Leone Haven, MD  sodium chloride (OCEAN) 0.65 % SOLN nasal spray Place 1 spray into both nostrils as needed for congestion. Patient not taking: Reported on 04/13/2021    [provider]    Family History Family History  Problem Relation Age of Onset   Alcoholism Other    Breast cancer Maternal Aunt 27       Mothers aunt   Lung cancer Other    Hypertension Father    Heart disease Father    Hypertension Brother    Hypertension Brother        prediabetes   Lymphoma Paternal Grandfather    Schizophrenia Maternal Uncle     Social History Social History   Tobacco Use   Smoking status: Former    Packs/day: 0.25    Years: 16.00    Pack years: 4.00    Types: Cigarettes    Quit date: 01/07/2015    Years since quitting: 6.6   Smokeless tobacco: Never  Vaping Use   Vaping Use: Never used  Substance Use Topics   Alcohol use: Yes    Alcohol/week: 0.0 standard drinks    Comment: rare   Drug use: No     Allergies   Patient has no known allergies.   Review of Systems Review of Systems  Constitutional:  Negative for chills and fever.  HENT:  Positive for sore throat. Negative for ear pain.   Respiratory:  Positive for cough. Negative for shortness of breath.   Cardiovascular:  Negative for chest pain and palpitations.  Gastrointestinal:  Negative for diarrhea and  vomiting.  Skin:  Negative for color change and rash.  All other systems reviewed and are negative.   Physical Exam Triage Vital Signs ED Triage Vitals  Enc Vitals Group     BP      Pulse      Resp      Temp      Temp src      SpO2  Weight      Height      Head Circumference      Peak Flow      Pain Score      Pain Loc      Pain Edu?      Excl. in [place]?    No data found.  Updated Vital Signs BP 128/84 (BP Location: Left Arm)   Pulse 76   Temp 98.3 F (36.8 C) (Oral)   Resp 18   LMP  (LMP Unknown)   SpO2 96%   Visual Acuity Right Eye Distance:   Left Eye Distance:   Bilateral Distance:    Right Eye Near:   Left Eye Near:    Bilateral Near:     Physical Exam Vitals and nursing note reviewed.  Constitutional:      General: She is not in acute distress.    Appearance: She is well-developed. She is obese. She is not ill-appearing.  HENT:     Right Ear: Tympanic membrane normal.     Left Ear: Tympanic membrane normal.     Nose: Nose normal.     Mouth/Throat:     Mouth: Mucous membranes are moist.     Pharynx: Oropharynx is clear.  Cardiovascular:     Rate and Rhythm: Normal rate and regular rhythm.     Heart sounds: Normal heart sounds.  Pulmonary:     Effort: Pulmonary effort is normal. No respiratory distress.     Breath sounds: Normal breath sounds. No wheezing.  Musculoskeletal:     Cervical back: Neck supple.  Skin:    General: Skin is warm and dry.  Neurological:     Mental Status: She is alert.  Psychiatric:        Mood and Affect: Mood normal.        Behavior: Behavior normal.     UC Treatments / Results  Labs (all labs ordered are listed, but only abnormal results are displayed) Labs Reviewed - No data to display  EKG   Radiology No results found.  Procedures Procedures (including critical care time)  Medications Ordered in UC Medications - No data to display  Initial Impression / Assessment and Plan / UC Course  I have  reviewed the triage vital signs and the nursing notes.  Pertinent labs & imaging results that were available during my care of the patient were reviewed by me and considered in my medical decision making (see chart for details).   Influenza A, sore throat, cough.  Patient is on day 8 of her symptoms.  Not improving with symptomatic treatment.  Tested positive for influenza A on 08/27/2021.  Treating today with Zithromax.  Instructed her to continue symptomatic treatment also and to follow up with her PCP.  She agrees to plan of care.      Final Clinical Impressions(s) / UC Diagnoses   Final diagnoses:  Influenza A  Sore throat  Acute cough     Discharge Instructions      Take the antibiotic as directed.  Follow up with your primary care provider.        ED Prescriptions     Medication Sig Dispense Auth. Provider   azithromycin (ZITHROMAX) 250 MG tablet Take 1 tablet (250 mg total) by mouth daily. Take first 2 tablets together, then 1 every day until finished. 6 tablet Sharion Balloon, NP      PDMP not reviewed this encounter.   Sharion Balloon, NP 08/30/21 [phone]

## 2021-08-30 NOTE — ED Triage Notes (Signed)
Pt was dx with flu on 08/27/21 and she is not better. She has a cough, chest congestion, and ST.

## 2021-08-30 NOTE — Discharge Instructions (Addendum)
Take the antibiotic as directed.  Follow up with your primary care provider.    

## 2021-09-04 ENCOUNTER — Other Ambulatory Visit: Payer: Self-pay

## 2021-09-04 ENCOUNTER — Ambulatory Visit: Payer: Managed Care, Other (non HMO) | Admitting: Dermatology

## 2021-09-04 DIAGNOSIS — L853 Xerosis cutis: Secondary | ICD-10-CM

## 2021-09-04 DIAGNOSIS — L578 Other skin changes due to chronic exposure to nonionizing radiation: Secondary | ICD-10-CM

## 2021-09-04 DIAGNOSIS — D1800 Hemangioma unspecified site: Secondary | ICD-10-CM

## 2021-09-04 DIAGNOSIS — Z1283 Encounter for screening for malignant neoplasm of skin: Secondary | ICD-10-CM

## 2021-09-04 DIAGNOSIS — L900 Lichen sclerosus et atrophicus: Secondary | ICD-10-CM

## 2021-09-04 DIAGNOSIS — I8393 Asymptomatic varicose veins of bilateral lower extremities: Secondary | ICD-10-CM

## 2021-09-04 DIAGNOSIS — L821 Other seborrheic keratosis: Secondary | ICD-10-CM

## 2021-09-04 DIAGNOSIS — L814 Other melanin hyperpigmentation: Secondary | ICD-10-CM

## 2021-09-04 DIAGNOSIS — D229 Melanocytic nevi, unspecified: Secondary | ICD-10-CM

## 2021-09-04 DIAGNOSIS — L709 Acne, unspecified: Secondary | ICD-10-CM | POA: Diagnosis not present
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
## 2021-09-04 NOTE — Patient Instructions (Addendum)
Gentle Skin Care Guide  1. Bathe no more than once a day.  2. Avoid bathing in hot water  3. Use a mild soap like Dove, Vanicream, Cetaphil, CeraVe. Can use Lever 2000 or Cetaphil antibacterial soap  4. Use soap only where you need it. On most days, use it under your arms, between your legs, and on your feet. Let the water rinse other areas unless visibly dirty.  5. When you get out of the bath/shower, use a towel to gently blot your skin dry, don't rub it.  6. While your skin is still a little damp, apply a moisturizing cream such as Vanicream, CeraVe, Cetaphil, Eucerin, Sarna lotion or plain Vaseline Jelly. For hands apply Neutrogena [place] Hand Cream or Excipial Hand Cream.  7. Reapply moisturizer any time you start to itch or feel dry.  8. Sometimes using free and clear laundry detergents can be helpful. Fabric softener sheets should be avoided. Downy Free & Gentle liquid, or any liquid fabric softener that is free of dyes and perfumes, it acceptable to use  9. If your doctor has given you prescription creams you may apply moisturizers over them   Melanoma ABCDEs  Melanoma is the most dangerous type of skin cancer, and is the leading cause of death from skin disease.  You are more likely to develop melanoma if you: Have light-colored skin, light-colored eyes, or red or blond hair Spend a lot of time in the sun Tan regularly, either outdoors or in a tanning bed Have had blistering sunburns, especially during childhood Have a close family member who has had a melanoma Have atypical moles or large birthmarks  Early detection of melanoma is key since treatment is typically straightforward and cure rates are extremely high if we catch it early.   The first sign of melanoma is often a change in a mole or a new dark spot.  The ABCDE system is a way of remembering the signs of melanoma.  A for asymmetry:  The two halves do not match. B for border:  The edges of the growth are  irregular. C for color:  A mixture of colors are present instead of an even brown color. D for diameter:  Melanomas are usually (but not always) greater than 47mm - the size of a pencil eraser. E for evolution:  The spot keeps changing in size, shape, and color.  Please check your skin once per month between visits. You can use a small mirror in front and a large mirror behind you to keep an eye on the back side or your body.   If you see any new or changing lesions before your next follow-up, please call to schedule a visit.  Please continue daily skin protection including broad spectrum sunscreen SPF 30+ to sun-exposed areas, reapplying every 2 hours as needed when you're outdoors.   Staying in the shade or wearing long sleeves, sun glasses (UVA+UVB protection) and wide brim hats (4-inch brim around the entire circumference of the hat) are also recommended for sun protection.    If You Need Anything After Your Visit  If you have any questions or concerns for your doctor, please call our main line at [phone] and press option 4 to reach your doctor's medical assistant. If no one answers, please leave a voicemail as directed and we will return your call as soon as possible. Messages left after 4 pm will be answered the following business day.   You may also send [place] a message  via [place]. We typically respond to MyChart messages within 1-2 business days.  For prescription refills, please ask your pharmacy to contact our office. Our fax number is [phone].  If you have an urgent issue when the clinic is closed that cannot wait until the next business day, you can page your doctor at the number below.    Please note that while we do our best to be available for urgent issues outside of office hours, we are not available 24/7.   If you have an urgent issue and are unable to reach [place], you may choose to seek medical care at your doctor's office, retail clinic, urgent care center, or  emergency room.  If you have a medical emergency, please immediately call 911 or go to the emergency department.  Pager Numbers  - Dr. Nehemiah Massed: [phone]  - Dr. Laurence Ferrari: [phone]  - Dr. Nicole Kindred: [phone]  In the event of inclement weather, please call our main line at [phone] for an update on the status of any delays or closures.  Dermatology Medication Tips: Please keep the boxes that topical medications come in in order to help keep track of the instructions about where and how to use these. Pharmacies typically print the medication instructions only on the boxes and not directly on the medication tubes.   If your medication is too expensive, please contact our office at [phone] option 4 or send [place] a message through [place].   We are unable to tell what your co-pay for medications will be in advance as this is different depending on your insurance coverage. However, we may be able to find a substitute medication at lower cost or fill out paperwork to get insurance to cover a needed medication.   If a prior authorization is required to get your medication covered by your insurance company, please allow [place] 1-2 business days to complete this process.  Drug prices often vary depending on where the prescription is filled and some pharmacies may offer cheaper prices.  The website www.goodrx.com contains coupons for medications through different pharmacies. The prices here do not account for what the cost may be with help from insurance (it may be cheaper with your insurance), but the website can give you the price if you did not use any insurance.  - You can print the associated coupon and take it with your prescription to the pharmacy.  - You may also stop by our office during regular business hours and pick up a GoodRx coupon card.  - If you need your prescription sent electronically to a different pharmacy, notify our office through [facility] or by phone at  [phone] option 4.     Si Usted Necesita Algo Despus de Su Visita  Tambin puede enviarnos un mensaje a travs de Pharmacist, community. Por lo general respondemos a los mensajes de MyChart en el transcurso de 1 a 2 das hbiles.  Para renovar recetas, por favor pida a su farmacia que se ponga en contacto con nuestra oficina. Harland Dingwall de fax es [place] [phone].  Si tiene un asunto urgente cuando la clnica est cerrada y que no puede esperar hasta el siguiente da hbil, puede llamar/localizar a su doctor(a) al nmero que aparece a continuacin.   Por favor, tenga en cuenta que aunque hacemos todo lo posible para estar disponibles para asuntos urgentes fuera del horario de [place], no estamos disponibles las 24 horas del da, los 7 das de la [place].   Si tiene un problema urgente y  no puede comunicarse con nosotros, puede optar por buscar atencin mdica  en el consultorio de su doctor(a), en una clnica privada, en un centro de atencin urgente o en una sala de emergencias.  Si tiene Engineering geologist, por favor llame inmediatamente al 911 o vaya a la sala de emergencias.  Nmeros de bper  - Dr. Nehemiah Massed: [phone]  - Dra. Moye: [phone]  - Dra. Nicole Kindred: [phone]  En caso de inclemencias del [place], por favor llame a Johnsie Kindred principal al [phone] para una actualizacin sobre el [place] de cualquier retraso o cierre.  Consejos para la medicacin en dermatologa: Por favor, guarde las cajas en las que vienen los medicamentos de uso tpico para ayudarle a seguir las instrucciones sobre dnde y cmo usarlos. Las farmacias generalmente imprimen las instrucciones del medicamento slo en las cajas y no directamente en los tubos del [place].   Si su medicamento es muy caro, por favor, pngase en contacto con Zigmund Daniel llamando al [phone] y presione la opcin 4 o envenos un mensaje a travs de Pharmacist, community.   No podemos decirle cul ser su copago por los  medicamentos por adelantado ya que esto es diferente dependiendo de la cobertura de su seguro. Sin embargo, es posible que podamos encontrar un medicamento sustituto a Electrical engineer un formulario para que el seguro cubra el medicamento que se considera necesario.   Si se requiere una autorizacin previa para que su compaa de seguros [place] su medicamento, por favor permtanos de 1 a 2 das hbiles para completar este proceso.  Los precios de los medicamentos varan con frecuencia dependiendo del Environmental consultant de dnde se surte la receta y alguna farmacias pueden ofrecer precios ms baratos.  El sitio web www.goodrx.com tiene cupones para medicamentos de Airline pilot. Los precios aqu no tienen en cuenta lo que podra costar con la ayuda del seguro (puede ser ms barato con su seguro), pero el sitio web puede darle el precio si no utiliz Research scientist (physical sciences).  - Puede imprimir el cupn correspondiente y llevarlo con su receta a la farmacia.  - Tambin puede pasar por nuestra oficina durante el horario de atencin regular y Charity fundraiser una tarjeta de cupones de GoodRx.  - Si necesita que su receta se enve electrnicamente a una farmacia diferente, informe a nuestra oficina a travs de MyChart de [place] o por telfono llamando al [phone] y presione la opcin 4.

## 2021-09-20 ENCOUNTER — Other Ambulatory Visit: Payer: Self-pay | Admitting: Neurology

## 2021-10-19 NOTE — Progress Notes (Signed)
NEUROLOGY FOLLOW UP OFFICE NOTE  [name] [id number]  Assessment/Plan:   Menstrually related migraine, without status migrainosus, not intractable Lumbar radiculitis Hyperreflexia   Asked to have MRI results faxed over to me - further recommendations pending results. Migraine prevention:  Increase propranolol ER to 120mg  daily Migraine rescue:  Continue Maxalt-MLT 10mg  and Zofran ODT 4mg  For perimenstrual prophylaxis, take Nurtec 75mg  daily for 14 days beginning first day of period.  If effective, will send prescription. Limit use of pain relievers to no more than 2 days out of week to prevent risk of rebound or medication-overuse headache. Keep headache diary Follow up 4 months   Subjective:  Melinda Cortez is a 41 year old female with depression and anxiety who follows up for headache and bilateral leg pain/weakness.  UPDATE: Migraine: Last visit, increased propranolol to 80mg   Intensity:  mild to severe Duration:  50% of time needs to repeat dose of Maxalt and take a nap (usually for migraines occurring week after period).   Frequency:  Occurs a week after her period - has a migraine for 5-6 days.  May have 3 migraine days outside of that week.  Current NSAIDS/analgesics:  Tylenol Current triptans:  Maxalt-MLT 10mg  Current ergotamine:  none Current anti-emetic:  Zofran ODT 4mg  Current muscle relaxants:  none Current Antihypertensive medications:  propranolol ER 80mg  daily Current Antidepressant medications:  sertraline 100mg  QD, Wellbutrin XL 300mg  QD Current Anticonvulsant medications:  none Current anti-CGRP:  none Current Vitamins/Herbal/Supplements:  none Current Antihistamines/Decongestants:  Zyrtec, Benadryl Other therapy:  heating pad Hormone/birth control:  none  For low back and bilateral leg pain, she was referred to physical therapy.  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  She was referred to neurosurgery.  They stated there wasn't anything surgical but she may respond to an injection.  However, she exhibited hyperreflexes in the upper as well as lower extremities.  MRI of cervical spine was reportedly ordered and scheduled for tomorrow.    Caffeine:  Diet soda on weekends.  Occasional coffee. Alcohol:  on occasion Smoker:  no Diet:  close to 64 oz water daily.  Skips breakfast Exercise:  yes Depression:  yes; Anxiety:  yes - treated Other pain: knee pain Sleep:  trouble falling asleep. Able to stay asleep.  Sometimes takes melatonin  HISTORY:  Onset:  late 63s - early 28s, worse over past 2 years. Location:  left retro-orbital Quality:  pressure/stabbing Intensity:  Mild and severe.   Aura:  absent Prodrome:  absent Associated symptoms:  Nausea, vomiting, photophobia, phonophobia, osmophobia, feels like she may not have "full vision" but no blurred vision.  She denies associated  unilateral numbness or weakness. Duration:  severe pain lasts 1 day with lingering mild headache for 6 days - used to occur during week of menstrual period but more recently occurring week after or 2 weeks after Frequency:  once a month Frequency of abortive medication: 7 days a month Triggers:  hormonal Relieving factors:  heating pad on head, sleep in dark room Activity:  movement/activity aggravates She was seen in the ED in May 2022 for intractable migraine.  Due to nausea, wasn't able to keep her Relpax down.  Received headache cocktail which helped.   She had her son in  2019.  She received an epidural.  Since then, she has had intermittent shooting pains down both legs, typically anterior thigh.  She does report some low back pain.  No leg weakness or numbness.  No bowel or bladder dysfunction.     Past NSAIDS/analgesics:  ibuprofen, meloxicam Past abortive triptans:  eletriptan  Past abortive ergotamine:  none Past muscle relaxants:  none Past anti-emetic:  Reglan, Compazine, promethazine Past antihypertensive medications:  labetolol Past antidepressant medications:  none Past anticonvulsant medications:  none Past anti-CGRP:  none Past vitamins/Herbal/Supplements:  none Past antihistamines/decongestants:  hydroxyzine Other past therapies:  prednisone    Family history of headache:  brother, father, paternal grandmother  PAST MEDICAL HISTORY: Past Medical History:  Diagnosis Date   Allergic rhinitis    Anxiety    Chronic headaches    HORMONAL    Complication of anesthesia    Depression    Elevated blood pressure    Elevated blood pressure affecting pregnancy in third trimester, antepartum 10/16/2017   GERD (gastroesophageal reflux disease)    Gestational hypertension 11/15/2017   PONV (postoperative nausea and vomiting)     MEDICATIONS: Current Outpatient Medications on File Prior to Visit  Medication Sig Dispense Refill   acetaminophen (TYLENOL) 500 MG tablet Take 500 mg by mouth every 6 (six) hours as needed.     buPROPion (WELLBUTRIN XL) 300 MG 24 hr tablet TAKE 1 TABLET BY MOUTH  DAILY 90 tablet 3   cetirizine (ZYRTEC ALLERGY) 10 MG tablet Take 1 tablet (10 mg total) by mouth daily. 30 tablet 0   clobetasol cream (TEMOVATE) 3.53 % Apply 1 application topically 2 (two) times daily. 60 each 1   diphenhydrAMINE (BENADRYL) 25 MG tablet Take 25 mg by mouth at bedtime as needed for sleep.     hydrOXYzine (ATARAX/VISTARIL) 10 MG tablet Take 1 tablet (10 mg total) by mouth 3 (three) times daily as needed for anxiety. 30 tablet 0   ibuprofen (ADVIL,MOTRIN) 600 MG tablet Take 1 tablet (600 mg total) by mouth every 6 (six) hours as needed for mild pain, moderate pain or cramping. 30 tablet 0   Melatonin-Pyridoxine (MELATIN PO) Take by mouth.     omeprazole (PRILOSEC) 40 MG  capsule TAKE 1 CAPSULE BY MOUTH  DAILY 90 capsule 3   ondansetron (ZOFRAN ODT) 4 MG disintegrating tablet Take 1 tablet (4 mg total) by mouth every 8 (eight) hours as needed for nausea or vomiting. 20 tablet 5   Prenatal Vit-Fe Fumarate-FA (PRENATAL MULTIVITAMIN) TABS tablet Take 1 tablet by mouth daily.      sertraline (ZOLOFT) 100 MG tablet TAKE 1 TABLET BY MOUTH  DAILY 90 tablet 3   sodium chloride (OCEAN) 0.65 % SOLN nasal spray Place 1 spray into both nostrils as needed for congestion.     No current facility-administered medications on file prior to visit.    ALLERGIES: No Known Allergies  FAMILY HISTORY: Family History  Problem Relation Age of Onset   Alcoholism Other    Breast cancer Maternal Aunt 33       Mothers aunt   Lung cancer Other    Hypertension Father    Heart disease Father    Hypertension Brother    Hypertension Brother        prediabetes   Lymphoma Paternal Grandfather    Schizophrenia Maternal Uncle       Objective:  Blood pressure 137/69, pulse 71, height 5\' 6"  (1.676 m), weight 260 lb 6.4 oz (118.1 kg), SpO2 98 %.  General: No acute distress.  Patient appears well-groomed.   Head:  Normocephalic/atraumatic Eyes:  Fundi examined but not visualized Neck: supple, no paraspinal tenderness, full range of motion Heart:  Regular rate and rhythm Lungs:  Clear to auscultation bilaterally Back: No paraspinal tenderness Neurological Exam: alert and oriented to person, place, and time.  Speech fluent and not dysarthric, language intact.  CN II-XII intact. Bulk and tone normal, muscle strength 5/5 throughout.  Sensation to light touch intact.  Deep tendon reflexes 3/4 throughout (LE > UE), no clonus, Hoffman absent, Babinski absent.  Finger to nose testing intact.  Gait normal, Romberg negative.   Metta Clines, DO  CC: Tommi Rumps, MD

## 2021-10-22 ENCOUNTER — Ambulatory Visit: Payer: Managed Care, Other (non HMO) | Admitting: Neurology

## 2021-10-22 ENCOUNTER — Other Ambulatory Visit: Payer: Self-pay

## 2021-10-22 ENCOUNTER — Encounter: Payer: Self-pay | Admitting: Neurology

## 2021-10-22 VITALS — BP 137/69 | HR 71 | Ht 66.0 in | Wt 260.4 lb

## 2021-10-22 DIAGNOSIS — R200 Anesthesia of skin: Secondary | ICD-10-CM | POA: Diagnosis not present

## 2021-10-22 DIAGNOSIS — G43829 Menstrual migraine, not intractable, without status migrainosus: Secondary | ICD-10-CM

## 2021-10-22 DIAGNOSIS — R292 Abnormal reflex: Secondary | ICD-10-CM | POA: Diagnosis not present

## 2021-10-22 MED ORDER — RIZATRIPTAN BENZOATE 10 MG PO TBDP: ORAL_TABLET | ORAL | 5 refills | Status: DC

## 2021-10-22 MED ORDER — PROPRANOLOL HCL ER 120 MG PO CP24: 120.0000 mg | ORAL_CAPSULE | Freq: Every day | ORAL | 5 refills | Status: DC

## 2021-10-22 NOTE — Patient Instructions (Signed)
Increase propranolol ER 120mg  daily On first day of period, take Nurtec once daily for 14 days.  Contact me to let me know if effective Take rizatriptan as needed.  Limit use of pain relievers to no more than 2 days out of week to prevent risk of rebound or medication-overuse headache. Take Zofran as needed Have MRI results sent to me.  Further recommendations pending results. Follow up 4 months.

## 2021-10-22 NOTE — Progress Notes (Signed)
Medication Samples have been provided to the patient.  Drug name: Nurtec       Strength: 75 mg        Qty: 7  LOT: [id number]  Exp.Date: 01/2024  Dosing instructions: as needed  The patient has been instructed regarding the correct time, dose, and frequency of taking this medication, including desired effects and most common side effects.   Venetia Night 11:20 AM 10/22/2021

## 2021-10-31 ENCOUNTER — Ambulatory Visit (INDEPENDENT_AMBULATORY_CARE_PROVIDER_SITE_OTHER): Payer: 59 | Admitting: Psychology

## 2021-10-31 DIAGNOSIS — F4323 Adjustment disorder with mixed anxiety and depressed mood: Secondary | ICD-10-CM

## 2021-10-31 NOTE — Progress Notes (Signed)
[facility] Counselor/Therapist Progress Note  Patient ID: Melinda Cortez, MRN: [id number],    Date: 10/31/2021  Time Spent: 12:00pm - 1:00pm   60 minutes   Treatment Type: Individual Therapy  Reported Symptoms: stress  Mental Status Exam: Appearance:  Casual     Behavior: Appropriate  Motor: Normal  Speech/Language:  Normal Rate  Affect: Appropriate  Mood: normal  Thought process: normal  Thought content:   WNL  Sensory/Perceptual disturbances:   WNL  Orientation: oriented to person, place, time/date, and situation  Attention: Good  Concentration: Good  Memory: WNL  Fund of knowledge:  Good  Insight:   Good  Judgment:  Good  Impulse Control: Good   Risk Assessment: Danger to Self:  No Self-injurious Behavior: No Danger to Others: No Duty to Warn:no Physical Aggression / Violence:No  Access to Firearms a concern: No  Gang Involvement:No   Subjective: F43.23 Pt present for face-to-face individual therapy in person.  Location of pt: office Location of therapist: office. Pt talked about her daughter who is 80 years old and having "attitude".   She argues a lot.  She is in puberty and is moody.   Pt has the mother / daughter dynamic of power struggles.   Helped pt process her feelings and relationship dynamics.   Pt's daughter use to be close to her father Gerald Stabs but has withdrawn from him since hitting puberty.  This has been hard for Gerald Stabs and he tends to take things personally.   Pt's son is 20 years old and at times her daughter is mean to him.   Pt's husband works from home and has a lot of work stress and tends to be angry a lot.  This is challenging for pt.  Pt feels a lot of stress and fatigue from from the family issues.   Pt tends to take too much responsibility for her husband's happiness.  Worked on boundary issues.   Worked on Child psychotherapist.   Pt has not been engaging in much self care.  Identified self care pt can engage in such as having  some respite time from family, going to spas, being on a consistent sleep schedule, entering a weight loss program (weight watchers).  Worked on self care strategies.   Pt takes Welbutrin and Zoloft and feels her mood is stable overall.   Provided supportive counseling.     Interventions: Cognitive Behavioral Therapy and Insight-Oriented  Diagnosis:  F43.23  Plan: See pt's Treatment Plan for depression and anxiety in Therapy Charts.  (Treatment Plan Target Date: 02/15/2022) Pt is progressing toward treatment goals.   Plan to continue to see pt monthly.    Lorcan Shelp, LCSW

## 2022-01-01 ENCOUNTER — Encounter: Payer: Self-pay | Admitting: Neurology

## 2022-01-01 ENCOUNTER — Other Ambulatory Visit: Payer: Self-pay | Admitting: Family Medicine

## 2022-01-01 ENCOUNTER — Other Ambulatory Visit: Payer: Self-pay | Admitting: Neurology

## 2022-01-01 MED ORDER — NURTEC 75 MG PO TBDP: 75.0000 mg | ORAL_TABLET | Freq: Every day | ORAL | 5 refills | Status: DC | PRN

## 2022-01-02 ENCOUNTER — Other Ambulatory Visit (HOSPITAL_COMMUNITY): Payer: Self-pay

## 2022-01-02 ENCOUNTER — Telehealth: Payer: Self-pay

## 2022-01-02 NOTE — Telephone Encounter (Signed)
Patient Advocate Encounter ?  ?Received notification from [facility] that prior authorization for Nurtec '75mg'$  tabs is required by his/her insurance [employer]. ?  ?PA submitted on 01/02/22 ? ?Key#: [id number] ? ?Status is pending ?   ?[facility] will continue to follow: ? ?Patient Advocate ?Fax: [phone]  ?

## 2022-01-02 NOTE — Telephone Encounter (Signed)
Patient Advocate Encounter ? ?Prior Authorization for Nurtec '75mg'$  tabs has been approved.   ? ?PA# [id number] ? ?Effective dates: 01/02/22 through 07/04/22 ? ?Per Test Claim Patients co-pay is $60.  ? ?Spoke with Pharmacy to Process. ? ?Patient Advocate ?Fax: [phone]  ?

## 2022-02-13 ENCOUNTER — Other Ambulatory Visit: Payer: Self-pay | Admitting: Neurology

## 2022-02-13 ENCOUNTER — Ambulatory Visit (INDEPENDENT_AMBULATORY_CARE_PROVIDER_SITE_OTHER): Payer: 59 | Admitting: Psychology

## 2022-02-13 DIAGNOSIS — F4323 Adjustment disorder with mixed anxiety and depressed mood: Secondary | ICD-10-CM | POA: Diagnosis not present

## 2022-02-13 NOTE — Progress Notes (Signed)
[facility] Counselor/Therapist Progress Note  Patient ID: RUIE SENDEJO, MRN: [id number],    Date: 02/13/2022  Time Spent: 12:00pm - 12:55pm     55 minutes   Treatment Type: Individual Therapy  Reported Symptoms: stress  Mental Status Exam: Appearance:  Casual     Behavior: Appropriate  Motor: Normal  Speech/Language:  Normal Rate  Affect: Appropriate  Mood: normal  Thought process: normal  Thought content:   WNL  Sensory/Perceptual disturbances:   WNL  Orientation: oriented to person, place, time/date, and situation  Attention: Good  Concentration: Good  Memory: WNL  Fund of knowledge:  Good  Insight:   Good  Judgment:  Good  Impulse Control: Good   Risk Assessment: Danger to Self:  No Self-injurious Behavior: No Danger to Others: No Duty to Warn:no Physical Aggression / Violence:No  Access to Firearms a concern: No  Gang Involvement:No   Subjective: F43.23 Pt present for face-to-face individual therapy in person.  Location of pt: office Location of therapist: office. Pt states she has been busy.  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   Pt enjoys the volunteer work.   Pt's daughter starts middle school this fall.   Pt's daughter continues to have some "attitude" issues that can be challenging for pt.  Addressed how pt manages her daughter's moods and worked on parenting strategies.   Pt talked about her nephew being hospitalized at the psychiatric hospital for a week after making a suicidal gesture online,   Addressed pt's concerns about her nephew.   Pt talked about her husband.   Her husband has gotten back into therapy with Alene Mires which pt is happy about.   Pt's husband had been angry often so pt encouraged him to get back into therapy.   Pt states her husband is not very good at taking care of himself.   Pt tends to over function to work on her husband doing what he should do to take care of himself.   Pt feels a lot of  stress and fatigue from from the family issues.   Pt tends to take too much responsibility for her husband's happiness.  Worked on boundary issues and stress management.   Worked on self care strategies.   Provided supportive counseling.     Interventions: Cognitive Behavioral Therapy and Insight-Oriented  Diagnosis:  F43.23  Plan: See pt's Treatment Plan for depression and anxiety in Therapy Charts.  (Treatment Plan Target Date: 02/15/2022) Pt is progressing toward treatment goals.   Plan to continue to see pt monthly.    Majorie Santee, LCSW

## 2022-02-21 NOTE — Progress Notes (Unsigned)
NEUROLOGY FOLLOW UP OFFICE NOTE  Melinda Cortez [id number]  Assessment/Plan:   Menstrually related migraine, without status migrainosus, not intractable Lumbar radiculitis Hyperreflexia    Asked to have MRI results faxed over to me - further recommendations pending results. Migraine prevention:  Increase propranolol ER to '120mg'$  daily Migraine rescue:  Continue Maxalt-MLT '10mg'$  and Zofran ODT '4mg'$  For perimenstrual prophylaxis, take Nurtec '75mg'$  daily for 14 days beginning first day of period.  If effective, will send prescription. Limit use of pain relievers to no more than 2 days out of week to prevent risk of rebound or medication-overuse headache. Keep headache diary Follow up 4 months     Subjective:  Melinda Cortez is a 41 year old female with depression and anxiety who follows up for headache and bilateral leg pain/weakness.   UPDATE: Migraine: Increased propranolol ER to '120mg'$  daily.  Tried Nurtec as a perimenstrual prophylaxis.  ***  Intensity:  mild to severe Duration:  50% of time needs to repeat dose of Maxalt and take a nap (usually for migraines occurring week after period).   Frequency:  Occurs a week after her period - has a migraine for 5-6 days.  May have 3 migraine days outside of that week.   Perimenstrual prophylaxis:  take Nurtec '75mg'$  daily for 14 days beginning first day of period.  Current NSAIDS/analgesics:  Tylenol Current triptans:  Maxalt-MLT '10mg'$  Current ergotamine:  none Current anti-emetic:  Zofran ODT '4mg'$  Current muscle relaxants:  none Current Antihypertensive medications:  propranolol ER '120mg'$  daily Current Antidepressant medications:  sertraline '100mg'$  QD, Wellbutrin XL '300mg'$  QD Current Anticonvulsant medications:  none Current anti-CGRP:  Nurtec (perimenstrual prophylaxis) Current Vitamins/Herbal/Supplements:  none Current Antihistamines/Decongestants:  Zyrtec, Benadryl Other therapy:  heating pad Hormone/birth control:  none       Caffeine:  Diet soda on weekends.  Occasional coffee. Alcohol:  on occasion Smoker:  no Diet:  close to 64 oz water daily.  Skips breakfast Exercise:  yes Depression:  yes; Anxiety:  yes - treated Other pain: knee pain Sleep:  trouble falling asleep. Able to stay asleep.  Sometimes takes melatonin   HISTORY:  Onset:  late 56s - early 73s, worse over past 2 years. Location:  left retro-orbital Quality:  pressure/stabbing Intensity:  Mild and severe.   Aura:  absent Prodrome:  absent Associated symptoms:  Nausea, vomiting, photophobia, phonophobia, osmophobia, feels like she may not have "full vision" but no blurred vision.  She denies associated  unilateral numbness or weakness. Duration:  severe pain lasts 1 day with lingering mild headache for 6 days - used to occur during week of menstrual period but more recently occurring week after or 2 weeks after Frequency:  once a month Frequency of abortive medication: 7 days a month Triggers:  hormonal Relieving factors:  heating pad on head, sleep in dark room Activity:  movement/activity aggravates She was seen in the ED in May 2022 for intractable migraine.  Due to nausea, wasn't able to keep her Relpax down.  Received headache cocktail which helped.   She had her son in 2019.  She received an epidural.  Since then, she has had intermittent shooting pains down both legs, typically anterior thigh.  She does report some low back pain.  No leg weakness or numbness.  No bowel or bladder dysfunction.  For low back and bilateral leg pain, she was referred to physical therapy.  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  She was referred to neurosurgery.  They stated there wasn't anything surgical but she may respond to an injection.  However,  she exhibited hyperreflexes in the upper as well as lower extremities.  MRI of cervical spine was reportedly ordered and scheduled for tomorrow.       Past NSAIDS/analgesics:  ibuprofen, meloxicam Past abortive triptans:  eletriptan  Past abortive ergotamine:  none Past muscle relaxants:  none Past anti-emetic:  Reglan, Compazine, promethazine Past antihypertensive medications:  labetolol Past antidepressant medications:  none Past anticonvulsant medications:  none Past anti-CGRP:  none Past vitamins/Herbal/Supplements:  none Past antihistamines/decongestants:  hydroxyzine Other past therapies:  prednisone     Family history of headache:  brother, father, paternal grandmother  PAST MEDICAL HISTORY: Past Medical History:  Diagnosis Date   Allergic rhinitis    Anxiety    Chronic headaches    HORMONAL    Complication of anesthesia    Depression    Elevated blood pressure    Elevated blood pressure affecting pregnancy in third trimester, antepartum 10/16/2017   GERD (gastroesophageal reflux disease)    Gestational hypertension 11/15/2017   PONV (postoperative nausea and vomiting)     MEDICATIONS: Current Outpatient Medications on File Prior to Visit  Medication Sig Dispense Refill   acetaminophen (TYLENOL) 500 MG tablet Take 500 mg by mouth every 6 (six) hours as needed.     buPROPion (WELLBUTRIN XL) 300 MG 24 hr tablet TAKE 1 TABLET BY MOUTH  DAILY 90 tablet 3   cetirizine (ZYRTEC ALLERGY) 10 MG tablet Take 1 tablet (10 mg total) by mouth daily. 30 tablet 0   clobetasol cream (TEMOVATE) 2.35 % Apply 1 application topically 2 (two) times daily. 60 each 1   diphenhydrAMINE (BENADRYL) 25 MG tablet Take 25 mg by mouth at bedtime as needed for sleep.     hydrOXYzine (ATARAX/VISTARIL) 10 MG tablet Take 1 tablet (10 mg total) by mouth 3 (three) times daily as needed for anxiety. 30 tablet 0   ibuprofen (ADVIL,MOTRIN) 600 MG tablet Take 1 tablet (600 mg total) by mouth every 6 (six)  hours as needed for mild pain, moderate pain or cramping. 30 tablet 0   Melatonin-Pyridoxine (MELATIN PO) Take by mouth.     omeprazole (PRILOSEC) 40 MG capsule TAKE 1 CAPSULE BY MOUTH  DAILY 90 capsule 3   ondansetron (ZOFRAN ODT) 4 MG disintegrating tablet Take 1 tablet (4 mg total) by mouth every 8 (eight) hours as needed for nausea or vomiting. 20 tablet 5   Prenatal Vit-Fe Fumarate-FA (PRENATAL MULTIVITAMIN) TABS tablet Take 1 tablet by mouth daily.      propranolol ER (INDERAL LA) 120 MG 24 hr capsule Take 1 capsule (120 mg total) by mouth daily. 30 capsule 5   Rimegepant Sulfate (NURTEC) 75 MG TBDP Take 75 mg by mouth daily as needed. 16 tablet 5   rizatriptan (MAXALT-MLT) 10 MG disintegrating tablet Take 1 tablet earliest onset of migraine.  May repeat in 2 hours if needed.  Maximum 2 tablets in 24 hours 10 tablet 5   sertraline (ZOLOFT) 100 MG tablet TAKE 1 TABLET BY MOUTH  DAILY 90 tablet 3   sodium chloride (OCEAN) 0.65 % SOLN nasal spray Place 1 spray into both nostrils as needed for congestion.     No current facility-administered medications on file prior to visit.  ALLERGIES: No Known Allergies  FAMILY HISTORY: Family History  Problem Relation Age of Onset   Alcoholism Other    Breast cancer Maternal Aunt 55       Mothers aunt   Lung cancer Other    Hypertension Father    Heart disease Father    Hypertension Brother    Hypertension Brother        prediabetes   Lymphoma Paternal Grandfather    Schizophrenia Maternal Uncle       Objective:  *** General: No acute distress.  Patient appears well-groomed.   Head:  Normocephalic/atraumatic Eyes:  Fundi examined but not visualized Neck: supple, no paraspinal tenderness, full range of motion Heart:  Regular rate and rhythm Lungs:  Clear to auscultation bilaterally Back: No paraspinal tenderness Neurological Exam: alert and oriented to person, place, and time.  Speech fluent and not dysarthric, language intact.  CN  II-XII intact. Bulk and tone normal, muscle strength 5/5 throughout.  Sensation to light touch intact.  Deep tendon reflexes 2+ throughout, toes downgoing.  Finger to nose testing intact.  Gait normal, Romberg negative.   Metta Clines, DO  CC: Tommi Rumps, MD

## 2022-02-22 ENCOUNTER — Encounter: Payer: Self-pay | Admitting: Neurology

## 2022-02-22 ENCOUNTER — Ambulatory Visit: Payer: Managed Care, Other (non HMO) | Admitting: Neurology

## 2022-02-22 VITALS — BP 117/83 | HR 65 | Ht 66.0 in | Wt 249.2 lb

## 2022-02-22 DIAGNOSIS — G43829 Menstrual migraine, not intractable, without status migrainosus: Secondary | ICD-10-CM | POA: Diagnosis not present

## 2022-02-22 DIAGNOSIS — M5416 Radiculopathy, lumbar region: Secondary | ICD-10-CM

## 2022-02-22 MED ORDER — SUMATRIPTAN SUCCINATE 100 MG PO TABS: 100.0000 mg | ORAL_TABLET | ORAL | 5 refills | Status: DC | PRN

## 2022-02-22 NOTE — Patient Instructions (Signed)
Migraine prevention:  Increase propranolol ER to '120mg'$  daily Migraine rescue:  STOP RIZATRIPTAN.  INSTEAD TRY SUMATRIPTAN '100MG'$  WHEN YOU GET A MIGRAINE; and Zofran ODT '4mg'$  For perimenstrual prophylaxis, take Nurtec '75mg'$  daily for 14 days beginning first day of period. Follow up in 6 months.

## 2022-03-24 ENCOUNTER — Other Ambulatory Visit: Payer: Self-pay | Admitting: Neurology

## 2022-03-29 ENCOUNTER — Other Ambulatory Visit: Payer: Self-pay

## 2022-03-29 MED ORDER — SUMATRIPTAN SUCCINATE 100 MG PO TABS: 100.0000 mg | ORAL_TABLET | ORAL | 5 refills | Status: DC | PRN

## 2022-03-29 NOTE — Telephone Encounter (Signed)
New script request recevied from [facility]

## 2022-04-04 ENCOUNTER — Encounter: Payer: Self-pay | Admitting: Family Medicine

## 2022-04-24 ENCOUNTER — Ambulatory Visit: Payer: Managed Care, Other (non HMO) | Admitting: Family Medicine

## 2022-04-24 ENCOUNTER — Encounter: Payer: Self-pay | Admitting: Family Medicine

## 2022-04-24 VITALS — BP 120/80 | HR 68 | Temp 98.1°F | Ht 66.0 in | Wt 259.6 lb

## 2022-04-24 DIAGNOSIS — E669 Obesity, unspecified: Secondary | ICD-10-CM | POA: Diagnosis not present

## 2022-04-24 DIAGNOSIS — F32A Depression, unspecified: Secondary | ICD-10-CM

## 2022-04-24 DIAGNOSIS — F419 Anxiety disorder, unspecified: Secondary | ICD-10-CM

## 2022-04-24 DIAGNOSIS — Z1231 Encounter for screening mammogram for malignant neoplasm of breast: Secondary | ICD-10-CM

## 2022-04-24 NOTE — Assessment & Plan Note (Signed)
Discussed the need to add in some exercise.  We discussed walking for 20 to 30 minutes 3 times a week.  Discussed improving her diet.  Discussed trying to get something with protein for breakfast.  Discussed reducing fast food intake in the evening.  Discussed she would need to have good dietary practices and exercise in place before considering medication for weight loss.  I will follow-up with her in 6 to 8 weeks and see how she is doing.

## 2022-04-24 NOTE — Assessment & Plan Note (Signed)
Well-controlled.  I did discuss the potential for switching Zoloft to something different given her weight issues though she opts to stick with Zoloft 100 mg daily and Wellbutrin 300 mg daily at this time.  She will continue those medications.

## 2022-04-24 NOTE — Progress Notes (Signed)
Tommi Rumps, MD Phone: [phone]  Melinda Cortez is a 41 y.o. female who presents today for follow-up.  Obesity: Patient notes she is not exercising.  She has impulse control issues with food.  This has been an issue her entire life.  She does not eat breakfast.  She will typically have snacks for lunch.  She has been eating unhealthy meals for dinner.  She does drink diet soda.  She does eat sugary foods and fatty/salty foods daily.  Anxiety/depression: Patient reports this is in a good place.  She notes no SI.  She continues on Wellbutrin and Zoloft.  She has not required Atarax in some time.  Social History   Tobacco Use  Smoking Status Former   Packs/day: 0.25   Years: 16.00   Total pack years: 4.00   Types: Cigarettes   Quit date: 01/07/2015   Years since quitting: 7.2  Smokeless Tobacco Never    Current Outpatient Medications on File Prior to Visit  Medication Sig Dispense Refill   acetaminophen (TYLENOL) 500 MG tablet Take 500 mg by mouth every 6 (six) hours as needed.     Ascorbic Acid (VITAMIN C) 1000 MG tablet Take 1,000 mg by mouth daily.     buPROPion (WELLBUTRIN XL) 300 MG 24 hr tablet TAKE 1 TABLET BY MOUTH  DAILY 90 tablet 3   cetirizine (ZYRTEC ALLERGY) 10 MG tablet Take 1 tablet (10 mg total) by mouth daily. 30 tablet 0   clobetasol cream (TEMOVATE) 0.98 % Apply 1 application topically 2 (two) times daily. 60 each 1   diphenhydrAMINE (BENADRYL) 25 MG tablet Take 25 mg by mouth at bedtime as needed for sleep.     hydrOXYzine (ATARAX/VISTARIL) 10 MG tablet Take 1 tablet (10 mg total) by mouth 3 (three) times daily as needed for anxiety. 30 tablet 0   ibuprofen (ADVIL,MOTRIN) 600 MG tablet Take 1 tablet (600 mg total) by mouth every 6 (six) hours as needed for mild pain, moderate pain or cramping. 30 tablet 0   magnesium 30 MG tablet Take 30 mg by mouth 2 (two) times daily.     Melatonin-Pyridoxine (MELATIN PO) Take by mouth.     omeprazole (PRILOSEC) 40 MG  capsule TAKE 1 CAPSULE BY MOUTH  DAILY 90 capsule 3   ondansetron (ZOFRAN ODT) 4 MG disintegrating tablet Take 1 tablet (4 mg total) by mouth every 8 (eight) hours as needed for nausea or vomiting. 20 tablet 5   Prenatal Vit-Fe Fumarate-FA (PRENATAL MULTIVITAMIN) TABS tablet Take 1 tablet by mouth daily.      propranolol ER (INDERAL LA) 120 MG 24 hr capsule TAKE 1 CAPSULE BY MOUTH DAILY 90 capsule 1   Rimegepant Sulfate (NURTEC) 75 MG TBDP Take 75 mg by mouth daily as needed. 16 tablet 5   sertraline (ZOLOFT) 100 MG tablet TAKE 1 TABLET BY MOUTH  DAILY 90 tablet 3   sodium chloride (OCEAN) 0.65 % SOLN nasal spray Place 1 spray into both nostrils as needed for congestion.     SUMAtriptan (IMITREX) 100 MG tablet Take 1 tablet (100 mg total) by mouth as needed for up to 1 dose for migraine (may repeat after 2 hours.  Maximum 2 tablets in 24 hours.). May repeat in 2 hours if headache persists or recurs. 10 tablet 5   Zinc-Vitamin C (ZINC-A-COLD/VITAMIN C MT) Use as directed in the mouth or throat.     No current facility-administered medications on file prior to visit.     ROS see  history of present illness  Objective  Physical Exam Vitals:   04/24/22 1138  BP: 120/80  Pulse: 68  Temp: 98.1 F (36.7 C)  SpO2: 99%    BP Readings from Last 3 Encounters:  04/24/22 120/80  02/22/22 117/83  10/22/21 137/69   Wt Readings from Last 3 Encounters:  04/24/22 259 lb 9.6 oz (117.8 kg)  02/22/22 249 lb 3.2 oz (113 kg)  10/22/21 260 lb 6.4 oz (118.1 kg)    Physical Exam Constitutional:      General: She is not in acute distress.    Appearance: She is not diaphoretic.  Cardiovascular:     Rate and Rhythm: Normal rate and regular rhythm.     Heart sounds: Normal heart sounds.  Pulmonary:     Effort: Pulmonary effort is normal.     Breath sounds: Normal breath sounds.  Skin:    General: Skin is warm and dry.  Neurological:     Mental Status: She is alert.      Assessment/Plan:  Please see individual problem list.  Problem List Items Addressed This Visit     Anxiety and depression (Chronic)    Well-controlled.  I did discuss the potential for switching Zoloft to something different given her weight issues though she opts to stick with Zoloft 100 mg daily and Wellbutrin 300 mg daily at this time.  She will continue those medications.      Obesity (BMI 35.0-39.9 without comorbidity) - Primary (Chronic)    Discussed the need to add in some exercise.  We discussed walking for 20 to 30 minutes 3 times a week.  Discussed improving her diet.  Discussed trying to get something with protein for breakfast.  Discussed reducing fast food intake in the evening.  Discussed she would need to have good dietary practices and exercise in place before considering medication for weight loss.  I will follow-up with her in 6 to 8 weeks and see how she is doing.      Other Visit Diagnoses     Encounter for screening mammogram for malignant neoplasm of breast       Relevant Orders   MM 3D SCREEN BREAST BILATERAL        Health Maintenance: Patient will call to schedule her mammogram.  Return in about 6 weeks (around 06/05/2022) for Weight follow-up.   Tommi Rumps, MD [place]

## 2022-04-24 NOTE — Patient Instructions (Signed)
Nice to see you. Please try to add in some walking for exercise as we discussed. Please make sure you get something for breakfast that has protein in it. Please reduce fast food intake in the evening.  If you are able to reduce your sugary/fatty food intake on a daily basis please try to do that as well.

## 2022-04-25 ENCOUNTER — Ambulatory Visit
Admission: RE | Admit: 2022-04-25 | Discharge: 2022-04-25 | Disposition: A | Discharge status: Home / Self Care | Payer: Managed Care, Other (non HMO) | Source: Ambulatory Visit | Attending: Family Medicine | Admitting: Family Medicine

## 2022-04-25 DIAGNOSIS — Z1231 Encounter for screening mammogram for malignant neoplasm of breast: Secondary | ICD-10-CM | POA: Insufficient documentation

## 2022-05-10 ENCOUNTER — Other Ambulatory Visit: Payer: Self-pay | Admitting: Family Medicine

## 2022-05-10 ENCOUNTER — Encounter: Payer: Self-pay | Admitting: Neurology

## 2022-05-10 ENCOUNTER — Other Ambulatory Visit: Payer: Self-pay

## 2022-05-10 MED ORDER — ONDANSETRON 4 MG PO TBDP: 4.0000 mg | ORAL_TABLET | Freq: Three times a day (TID) | ORAL | 4 refills | Status: DC | PRN

## 2022-05-13 ENCOUNTER — Encounter: Payer: Self-pay | Admitting: Internal Medicine

## 2022-05-13 ENCOUNTER — Ambulatory Visit (INDEPENDENT_AMBULATORY_CARE_PROVIDER_SITE_OTHER): Payer: Managed Care, Other (non HMO) | Admitting: Internal Medicine

## 2022-05-13 VITALS — BP 120/70 | HR 75 | Temp 98.2°F | Resp 16 | Ht 66.0 in | Wt 261.3 lb

## 2022-05-13 DIAGNOSIS — E66813 Obesity, class 3: Secondary | ICD-10-CM

## 2022-05-13 DIAGNOSIS — G43009 Migraine without aura, not intractable, without status migrainosus: Secondary | ICD-10-CM | POA: Diagnosis not present

## 2022-05-13 DIAGNOSIS — K219 Gastro-esophageal reflux disease without esophagitis: Secondary | ICD-10-CM | POA: Diagnosis not present

## 2022-05-13 DIAGNOSIS — F419 Anxiety disorder, unspecified: Secondary | ICD-10-CM

## 2022-05-13 DIAGNOSIS — F32A Depression, unspecified: Secondary | ICD-10-CM

## 2022-05-13 DIAGNOSIS — Z6841 Body Mass Index (BMI) 40.0 and over, adult: Secondary | ICD-10-CM

## 2022-05-13 DIAGNOSIS — J302 Other seasonal allergic rhinitis: Secondary | ICD-10-CM

## 2022-05-13 MED ORDER — OMEPRAZOLE 40 MG PO CPDR: 40.0000 mg | DELAYED_RELEASE_CAPSULE | Freq: Every day | ORAL | 3 refills | Status: DC

## 2022-05-13 MED ORDER — WEGOVY 0.25 MG/0.5ML ~~LOC~~ SOAJ: 0.2500 mg | SUBCUTANEOUS | 0 refills | Status: DC

## 2022-05-13 NOTE — Patient Instructions (Addendum)
It was great seeing you today!  Plan discussed at today's visit: -Wegovy started  -Prilosec refilled   Follow up in: 5 weeks   Take care and let us know if you have any questions or concerns prior to your next visit.  Dr. Rosana Berger

## 2022-05-13 NOTE — Progress Notes (Signed)
New Patient Office Visit  Subjective    Patient ID: Melinda Cortez, female    DOB: Aug 14, 1981  Age: 41 y.o. MRN: [id number]  CC:  Chief Complaint  Patient presents with   Establish Care    HPI Melinda Cortez presents to establish care.  Migraine: -Following with Neurology last seen 02/22/22 -Currently on Nurtec 1 week before and after cycle, Imitrex as needed. Propanolol 120 mg daily for ppx as well.  Seasonal Allergies: -Currently on Zyrtec daily for 5 years, symptoms well controlled.   GERD: -Currently on Prilosec 40 mg daily  -Symptoms well controlled  -EGD 03/21/21 showing 2 cm hiatal hernia   MDD: -Mood status: stable  -Current treatment: Wellbutrin 300 mg XL (started in 2017), Zoloft 100 mg (started in 2015) -Satisfied with current treatment?: yes -Duration of current treatment : years -Side effects: no Medication compliance: excellent compliance Psychotherapy/counseling: yes current     05/13/2022    3:06 PM 04/24/2022   11:41 AM 01/19/2021    3:21 PM 08/16/2020   10:41 AM 05/16/2020   10:00 AM  Depression screen PHQ 2/9  Decreased Interest 0 1 0 0 0  Down, Depressed, Hopeless 0 0 0 0 0  PHQ - 2 Score 0 1 0 0 0  Altered sleeping 0      Tired, decreased energy 0      Change in appetite 0      Feeling bad or failure about yourself  0      Trouble concentrating 0      Moving slowly or fidgety/restless 0      Suicidal thoughts 0      PHQ-9 Score 0      Difficult doing work/chores Not difficult at all       Weight Gain: -Had been working out and eating health in 2009, lost some weight but then started the Zoloft and gained 90 pounds  -Tried [employer], [employer] -Not currently trying to lose weight -Exercises some - takes yoga classes weekly  -Last A1c 7/23 5.3%  Health Maintenance: -Blood work UTD -Mammogram 8/23 Birads-1 -Pap 6/21 negative   Outpatient Encounter Medications as of 05/13/2022  Medication Sig   acetaminophen (TYLENOL) 500 MG  tablet Take 500 mg by mouth every 6 (six) hours as needed.   Ascorbic Acid (VITAMIN C) 1000 MG tablet Take 1,000 mg by mouth daily.   buPROPion (WELLBUTRIN XL) 300 MG 24 hr tablet TAKE 1 TABLET BY MOUTH DAILY   cetirizine (ZYRTEC ALLERGY) 10 MG tablet Take 1 tablet (10 mg total) by mouth daily.   clobetasol cream (TEMOVATE) 8.31 % Apply 1 application topically 2 (two) times daily.   diphenhydrAMINE (BENADRYL) 25 MG tablet Take 25 mg by mouth at bedtime as needed for sleep.   ibuprofen (ADVIL,MOTRIN) 600 MG tablet Take 1 tablet (600 mg total) by mouth every 6 (six) hours as needed for mild pain, moderate pain or cramping.   magnesium 30 MG tablet Take 30 mg by mouth 2 (two) times daily.   Melatonin-Pyridoxine (MELATIN PO) Take by mouth.   Multiple Vitamin (MULTIVITAMIN) tablet Take 1 tablet by mouth daily.   omeprazole (PRILOSEC) 40 MG capsule TAKE 1 CAPSULE BY MOUTH  DAILY   ondansetron (ZOFRAN ODT) 4 MG disintegrating tablet Take 1 tablet (4 mg total) by mouth every 8 (eight) hours as needed for nausea or vomiting.   propranolol ER (INDERAL LA) 120 MG 24 hr capsule TAKE 1 CAPSULE BY MOUTH DAILY   Rimegepant Sulfate (  NURTEC) 75 MG TBDP Take 75 mg by mouth daily as needed.   sertraline (ZOLOFT) 100 MG tablet TAKE 1 TABLET BY MOUTH  DAILY   sodium chloride (OCEAN) 0.65 % SOLN nasal spray Place 1 spray into both nostrils as needed for congestion.   SUMAtriptan (IMITREX) 100 MG tablet Take 1 tablet (100 mg total) by mouth as needed for up to 1 dose for migraine (may repeat after 2 hours.  Maximum 2 tablets in 24 hours.). May repeat in 2 hours if headache persists or recurs.   Zinc-Vitamin C (ZINC-A-COLD/VITAMIN C MT) Use as directed in the mouth or throat.   [DISCONTINUED] hydrOXYzine (ATARAX/VISTARIL) 10 MG tablet Take 1 tablet (10 mg total) by mouth 3 (three) times daily as needed for anxiety.   [DISCONTINUED] Prenatal Vit-Fe Fumarate-FA (PRENATAL MULTIVITAMIN) TABS tablet Take 1 tablet by mouth  daily.    No facility-administered encounter medications on file as of 05/13/2022.    Past Medical History:  Diagnosis Date   Allergic rhinitis    Anxiety    Chronic headaches    HORMONAL    Complication of anesthesia    Depression    Elevated blood pressure    Elevated blood pressure affecting pregnancy in third trimester, antepartum 10/16/2017   GERD (gastroesophageal reflux disease)    Gestational hypertension 11/15/2017   Migraines    PONV (postoperative nausea and vomiting)     Past Surgical History:  Procedure Laterality Date   DILATION AND CURETTAGE OF UTERUS     DILATION AND EVACUATION N/A 01/07/2017   Procedure: DILATATION AND EVACUATION;  Surgeon: Malachy Mood, MD;  Location: [facility] ORS;  Service: Gynecology;  Laterality: N/A;   TONSILLECTOMY AND ADENOIDECTOMY  1994    Family History  Problem Relation Age of Onset   Hypertension Father    Heart disease Father    Hypertension Brother    Hypertension Brother        prediabetes   Breast cancer Maternal Aunt 40       Mothers aunt   Schizophrenia Maternal Uncle    Lymphoma Paternal Grandfather    Alcoholism Other    Lung cancer Other     Social History   Socioeconomic History   Marital status: Married    Spouse name: Not on file   Number of children: 1   Years of education: Not on file   Highest education level: Not on file  Occupational History   Not on file  Tobacco Use   Smoking status: Former    Packs/day: 0.25    Years: 16.00    Total pack years: 4.00    Types: Cigarettes    Quit date: 01/07/2015    Years since quitting: 7.3   Smokeless tobacco: Never  Vaping Use   Vaping Use: Never used  Substance and Sexual Activity   Alcohol use: Yes    Alcohol/week: 0.0 standard drinks of alcohol    Comment: rare   Drug use: No   Sexual activity: Yes    Birth control/protection: None  Other Topics Concern   Not on file  Social History Narrative   Not on file   Social Determinants of Health    Financial Resource Strain: Not on file  Food Insecurity: Not on file  Transportation Needs: Not on file  Physical Activity: Not on file  Stress: Not on file  Social Connections: Not on file  Intimate Partner Violence: Not on file    Review of Systems  Constitutional:  Negative for chills  and fever.  Eyes:  Negative for blurred vision.  Respiratory:  Negative for shortness of breath.   Cardiovascular:  Negative for chest pain.  Gastrointestinal:  Negative for abdominal pain, heartburn, nausea and vomiting.        Objective    BP 120/70   Pulse 75   Temp 98.2 F (36.8 C)   Resp 16   Ht '5\' 6"'$  (1.676 m)   Wt 261 lb 4.8 oz (118.5 kg)   LMP 04/21/2022 (Exact Date)   SpO2 94%   BMI 42.17 kg/m   Physical Exam Constitutional:      Appearance: Normal appearance. She is obese.  HENT:     Head: Normocephalic and atraumatic.     Mouth/Throat:     Mouth: Mucous membranes are moist.     Pharynx: Oropharynx is clear.  Eyes:     Conjunctiva/sclera: Conjunctivae normal.     Pupils: Pupils are equal, round, and reactive to light.  Cardiovascular:     Rate and Rhythm: Normal rate and regular rhythm.  Pulmonary:     Effort: Pulmonary effort is normal.     Breath sounds: Normal breath sounds.  Musculoskeletal:     Right lower leg: No edema.     Left lower leg: No edema.  Skin:    General: Skin is warm and dry.  Neurological:     General: No focal deficit present.     Mental Status: She is alert. Mental status is at baseline.  Psychiatric:        Mood and Affect: Mood normal.        Behavior: Behavior normal.         Assessment & Plan:   1. Class 3 severe obesity due to excess calories with body mass index (BMI) of 40.0 to 44.9 in adult, unspecified whether serious comorbidity present [facility]): Patient interested in starting something to help with weight loss, already discussed with insurance who will cover [facility]. Discussed all potential side effects and risks of  medications. Denies personal or family history of medullary thyroid disease. Discussed how medication has to be coupled with calorie restriction and exercise to be successful. Patient is agreeable. Plan to start Wegovy 0.25 mg weekly, follow up here in 1 month to recheck.   - Semaglutide-Weight Management (WEGOVY) 0.25 MG/0.5ML SOAJ; Inject 0.25 mg into the skin once a week.  Dispense: 2 mL; Refill: 0  2. Migraine without aura and without status migrainosus, not intractable: Stable, following with Neurology, note and medications from 02/22/22 reviewed.   3. Anxiety and depression: Stable on Wellbutrin 300 mg XL, Zoloft 100 mg daily.   4. Gastroesophageal reflux disease, unspecified whether esophagitis present: Stable, continue Prilosec 40 mg daily, refilled today.   - omeprazole (PRILOSEC) 40 MG capsule; Take 1 capsule (40 mg total) by mouth daily.  Dispense: 90 capsule; Refill: 3  5. Seasonal allergies: Stable, continue Zyrtec, discussed alternating between anti-histamines to prevent less efficacy.    Return in about 5 weeks (around 06/17/2022).   Teodora Medici, DO

## 2022-05-17 ENCOUNTER — Encounter: Payer: Self-pay | Admitting: Internal Medicine

## 2022-05-22 ENCOUNTER — Telehealth (INDEPENDENT_AMBULATORY_CARE_PROVIDER_SITE_OTHER): Payer: Managed Care, Other (non HMO) | Admitting: Internal Medicine

## 2022-05-22 ENCOUNTER — Ambulatory Visit: Payer: 59 | Admitting: Psychology

## 2022-05-22 ENCOUNTER — Encounter: Payer: Self-pay | Admitting: Internal Medicine

## 2022-05-22 DIAGNOSIS — R5383 Other fatigue: Secondary | ICD-10-CM

## 2022-05-22 DIAGNOSIS — R0683 Snoring: Secondary | ICD-10-CM

## 2022-05-22 NOTE — Progress Notes (Signed)
Virtual Visit via Video Note  I connected with Melinda Cortez on 05/22/22 at 11:40 AM EDT by a video enabled telemedicine application and verified that I am speaking with the correct person using two identifiers.  Location: Patient: Home  Provider: [facility]   I discussed the limitations of evaluation and management by telemedicine and the availability of in person appointments. The patient expressed understanding and agreed to proceed.  History of Present Illness:  Patient is presenting via telemedicine to discuss sleep apnea. Does endorse snoring every night, especially with more weight gain. Denies any witnessed apneic events. Does have vivid dreams of not being able to get air. Does endorse morning fatigue, does not feels refreshed in the morning. Endorses frequent morning headaches, has a history of migraines and she usually wakes up with them.     05/22/2022   11:25 AM  Results of the [facility] flowsheet  Sitting and reading 1  Watching TV 1  Sitting, inactive in a public place (e.g. a theatre or a meeting) 0  As a passenger in a car for an hour without a break 1  Lying down to rest in the afternoon when circumstances permit 3  Sitting and talking to someone 0  Sitting quietly after a lunch without alcohol 1  In a car, while stopped for a few minutes in traffic 0  Total score 7    Observations/Objective:  General: well appearing, no acute distress ENT: conjunctiva normal appearing bilaterally  Skin: no rashes, cyanosis or abnormal bruising noted Neuro: answers all questions appropriately   Assessment and Plan:  1. Snoring/ Fatigue, unspecified type: Has risk factors and symptoms of OSA, will refer to sleep medicine for testing.   - Ambulatory referral to Pulmonology  Follow Up Instructions: already scheduled in 1 month     I discussed the assessment and treatment plan with the patient. The patient was provided an opportunity to ask questions and all were answered. The  patient agreed with the plan and demonstrated an understanding of the instructions.   The patient was advised to call back or seek an in-person evaluation if the symptoms worsen or if the condition fails to improve as anticipated.  I provided 14 minutes of non-face-to-face time during this encounter.   Teodora Medici, DO

## 2022-05-23 NOTE — Telephone Encounter (Signed)
Copied from [place] [id number]. Topic: General - Other >> May 23, 2022  9:40 AM Leitha Schuller wrote: Caller states additional information is needed for [employer] states a request form will be fax  Reference # [id number]

## 2022-05-28 ENCOUNTER — Ambulatory Visit (INDEPENDENT_AMBULATORY_CARE_PROVIDER_SITE_OTHER): Payer: Managed Care, Other (non HMO) | Admitting: Adult Health

## 2022-05-28 ENCOUNTER — Other Ambulatory Visit: Payer: Self-pay | Admitting: Internal Medicine

## 2022-05-28 ENCOUNTER — Encounter: Payer: Self-pay | Admitting: Adult Health

## 2022-05-28 VITALS — BP 110/90 | HR 67 | Temp 98.1°F | Ht 66.0 in | Wt 267.8 lb

## 2022-05-28 DIAGNOSIS — G4719 Other hypersomnia: Secondary | ICD-10-CM

## 2022-05-28 DIAGNOSIS — R0683 Snoring: Secondary | ICD-10-CM | POA: Diagnosis not present

## 2022-05-28 MED ORDER — WEGOVY 0.25 MG/0.5ML ~~LOC~~ SOAJ: 0.2500 mg | SUBCUTANEOUS | 0 refills | Status: DC

## 2022-05-28 NOTE — Assessment & Plan Note (Signed)
Healthy weight loss discussed 

## 2022-05-28 NOTE — Assessment & Plan Note (Signed)
Snoring, restless sleep, daytime sleepiness, fatigue, morning headaches, BMI 43 all suspicious for underlying sleep apnea.  Set up for home sleep study.  Patient education on healthy sleep regimen and sleep apnea.  - discussed how weight can impact sleep and risk for sleep disordered breathing - discussed options to assist with weight loss: combination of diet modification, cardiovascular and strength training exercises   - had an extensive discussion regarding the adverse health consequences related to untreated sleep disordered breathing - specifically discussed the risks for hypertension, coronary artery disease, cardiac dysrhythmias, cerebrovascular disease, and diabetes - lifestyle modification discussed   - discussed how sleep disruption can increase risk of accidents, particularly when driving - safe driving practices were discussed   We will set patient up for home sleep study.  Plan  Patient Instructions  Set up for home sleep study Work on healthy weight loss Do not drive if sleepy Healthy sleep regimen Follow-up in 2 months to discuss sleep study results and treatment plan

## 2022-05-28 NOTE — Progress Notes (Signed)
$'@Patient'Y$  ID: Melinda Cortez, female    DOB: April 21, 1981, 41 y.o.   MRN: [id number]  Chief Complaint  Patient presents with   Consult    Referring provider: Teodora Medici, DO  HPI: 41 year old female seen for sleep consult May 28, 2022 for snoring, restless sleep and daytime sleepiness.  TEST/EVENTS :   05/28/2022 Sleep consult  Patient presents for a sleep consult today.  Kindly referred by her primary cared provider, Dr. Rosana Berger.  Patient complains of ongoing snoring, restless sleep, daytime sleepiness.  Patient says she feels fatigued has morning headaches.  Wakes up unrefreshed.  Epworth score is 8 out of 24.  Typically gets sleepy if she sits down, watches TV or in the afternoon hours.  Patient typically goes to bed around 12 AM to 2 AM.  Has trouble going to sleep at times can take up to an hour to go to sleep.  Is up multiple times throughout the night.  Gets up around 8 am. Patient's weight is up 30 pounds over the last 2 years.  Current weight is at 267 pounds with a BMI of 43.  Patient says she feels restless at nighttime.  Can feel like she has restless leg at times.  Patient feels like she gasp for air at nighttime while sleeping at times. Epworth score is 8 out 24.  Typically gets sleepy in the afternoon hours, watching TV and sitting still. Caffeine intake 4 sodas.  No symptoms suspicious for cataplexy or sleep paralysis Takes Melatonin to help with insomnia.  No history of CHF or CVA . No removable dental work.   Medical history significant for hypertension, chronic allergies, migraines, Depression/Anxiety ,   Surgical history : Tonsillectomy   Family history positive for allergies, asthma, cancer. OSA .    Social history. Married. patient lives with her spouse.  Has 2 children.  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 .  Quit smoking in 2015.  Social alcohol intake.  No drugs.    No Known Allergies  Immunization History  Administered  Date(s) Administered   Influenza,inj,Quad PF,6+ Mos 07/15/2016, 07/15/2017, 07/14/2018, 05/17/2019, 08/16/2020   Influenza-Unspecified 07/15/2017   Moderna SARS-COV2 Booster Vaccination 10/11/2019   Moderna Sars-Covid-2 Vaccination 12/07/2019, 01/10/2020   Tdap 10/07/2017    Past Medical History:  Diagnosis Date   Allergic rhinitis    Anxiety    Chronic headaches    HORMONAL    Complication of anesthesia    Depression    Elevated blood pressure    Elevated blood pressure affecting pregnancy in third trimester, antepartum 10/16/2017   GERD (gastroesophageal reflux disease)    Gestational hypertension 11/15/2017   Migraines    PONV (postoperative nausea and vomiting)     Tobacco History: Social History   Tobacco Use  Smoking Status Former   Packs/day: 0.25   Years: 16.00   Total pack years: 4.00   Types: Cigarettes   Quit date: 01/06/2014   Years since quitting: 8.3  Smokeless Tobacco Never   Counseling given: Not Answered   Outpatient Medications Prior to Visit  Medication Sig Dispense Refill   acetaminophen (TYLENOL) 500 MG tablet Take 500 mg by mouth every 6 (six) hours as needed.     Ascorbic Acid (VITAMIN C) 1000 MG tablet Take 1,000 mg by mouth daily.     buPROPion (WELLBUTRIN XL) 300 MG 24 hr tablet TAKE 1 TABLET BY MOUTH DAILY 90 tablet 3   cetirizine (ZYRTEC ALLERGY) 10 MG tablet Take 1 tablet (  10 mg total) by mouth daily. 30 tablet 0   clobetasol cream (TEMOVATE) 0.73 % Apply 1 application topically 2 (two) times daily. 60 each 1   diphenhydrAMINE (BENADRYL) 25 MG tablet Take 25 mg by mouth at bedtime as needed for sleep.     ibuprofen (ADVIL,MOTRIN) 600 MG tablet Take 1 tablet (600 mg total) by mouth every 6 (six) hours as needed for mild pain, moderate pain or cramping. 30 tablet 0   magnesium 30 MG tablet Take 30 mg by mouth 2 (two) times daily.     Melatonin-Pyridoxine (MELATIN PO) Take by mouth.     Multiple Vitamin (MULTIVITAMIN) tablet Take 1 tablet by  mouth daily.     omeprazole (PRILOSEC) 40 MG capsule Take 1 capsule (40 mg total) by mouth daily. 90 capsule 3   ondansetron (ZOFRAN ODT) 4 MG disintegrating tablet Take 1 tablet (4 mg total) by mouth every 8 (eight) hours as needed for nausea or vomiting. 20 tablet 4   propranolol ER (INDERAL LA) 120 MG 24 hr capsule TAKE 1 CAPSULE BY MOUTH DAILY 90 capsule 1   Rimegepant Sulfate (NURTEC) 75 MG TBDP Take 75 mg by mouth daily as needed. 16 tablet 5   Semaglutide-Weight Management (WEGOVY) 0.25 MG/0.5ML SOAJ Inject 0.25 mg into the skin once a week. 2 mL 0   sertraline (ZOLOFT) 100 MG tablet TAKE 1 TABLET BY MOUTH  DAILY 90 tablet 3   sodium chloride (OCEAN) 0.65 % SOLN nasal spray Place 1 spray into both nostrils as needed for congestion.     SUMAtriptan (IMITREX) 100 MG tablet Take 1 tablet (100 mg total) by mouth as needed for up to 1 dose for migraine (may repeat after 2 hours.  Maximum 2 tablets in 24 hours.). May repeat in 2 hours if headache persists or recurs. 10 tablet 5   Zinc-Vitamin C (ZINC-A-COLD/VITAMIN C MT) Use as directed in the mouth or throat.     No facility-administered medications prior to visit.     Review of Systems:   Constitutional:   No  weight loss, night sweats,  Fevers, chills,  +fatigue, or  lassitude.  HEENT:   No headaches,  Difficulty swallowing,  Tooth/dental problems, or  Sore throat,                No sneezing, itching, ear ache, nasal congestion, post nasal drip,   CV:  No chest pain,  Orthopnea, PND, swelling in lower extremities, anasarca, dizziness, palpitations, syncope.   GI  No heartburn, indigestion, abdominal pain, nausea, vomiting, diarrhea, change in bowel habits, loss of appetite, bloody stools.   Resp: No shortness of breath with exertion or at rest.  No excess mucus, no productive cough,  No non-productive cough,  No coughing up of blood.  No change in color of mucus.  No wheezing.  No chest wall deformity  Skin: no rash or  lesions.  GU: no dysuria, change in color of urine, no urgency or frequency.  No flank pain, no hematuria   MS:  No joint pain or swelling.  No decreased range of motion.  No back pain.    Physical Exam  BP (!) 110/90 (BP Location: Right Arm, Patient Position: Sitting, Cuff Size: Large)   Pulse 67   Temp 98.1 F (36.7 C) (Oral)   Ht '5\' 6"'$  (1.676 m)   Wt 267 lb 12.8 oz (121.5 kg)   SpO2 98%   BMI 43.22 kg/m   GEN: A/Ox3; pleasant , NAD, well  nourished    HEENT:  [place]/AT,  NOSE-clear, THROAT-clear, no lesions, no postnasal drip or exudate noted. Class 2-3 MP airway   NECK:  Supple w/ fair ROM; no JVD; normal carotid impulses w/o bruits; no thyromegaly or nodules palpated; no lymphadenopathy.    RESP  Clear  P & A; w/o, wheezes/ rales/ or rhonchi. no accessory muscle use, no dullness to percussion  CARD:  RRR, no m/r/g, no peripheral edema, pulses intact, no cyanosis or clubbing.  GI:   Soft & nt; nml bowel sounds; no organomegaly or masses detected.   Musco: Warm bil, no deformities or joint swelling noted.   Neuro: alert, no focal deficits noted.    Skin: Warm, no lesions or rashes      BMET   BNP No results found for: "BNP"  ProBNP No results found for: "PROBNP"  Imaging: No results found.        No data to display          No results found for: "NITRICOXIDE"      Assessment & Plan:   Snoring Snoring, restless sleep, daytime sleepiness, fatigue, morning headaches, BMI 43 all suspicious for underlying sleep apnea.  Set up for home sleep study.  Patient education on healthy sleep regimen and sleep apnea.  - discussed how weight can impact sleep and risk for sleep disordered breathing - discussed options to assist with weight loss: combination of diet modification, cardiovascular and strength training exercises   - had an extensive discussion regarding the adverse health consequences related to untreated sleep disordered breathing -  specifically discussed the risks for hypertension, coronary artery disease, cardiac dysrhythmias, cerebrovascular disease, and diabetes - lifestyle modification discussed   - discussed how sleep disruption can increase risk of accidents, particularly when driving - safe driving practices were discussed   We will set patient up for home sleep study.  Plan  Patient Instructions  Set up for home sleep study Work on healthy weight loss Do not drive if sleepy Healthy sleep regimen Follow-up in 2 months to discuss sleep study results and treatment plan     Morbid obesity ([place]) Healthy weight loss discussed     Rexene Edison, NP 05/28/2022

## 2022-05-28 NOTE — Patient Instructions (Signed)
Set up for home sleep study Work on healthy weight loss Do not drive if sleepy Healthy sleep regimen Follow-up in 2 months to discuss sleep study results and treatment plan

## 2022-06-06 ENCOUNTER — Telehealth: Payer: Self-pay | Admitting: Pharmacy Technician

## 2022-06-06 NOTE — Telephone Encounter (Signed)
Patient Advocate Encounter   Received notification that prior authorization for Nurtec '75MG'$  dispersible tablets is required.   PA submitted on 06/06/2022 Key [id number] Status is pending       Lyndel Safe, [place] Patient Advocate Specialist [place] Patient Advocate Team Direct Number: [phone]  Fax: [phone]

## 2022-06-06 NOTE — Telephone Encounter (Signed)
Patient Advocate Encounter  Prior Authorization for Nurtec '75MG'$  dispersible tablets has been approved.    PA# [id number] Effective dates: 06/06/2022 through 06/07/2023    Lyndel Safe, [facility] Patient Advocate Specialist [place] Patient Advocate Team Direct Number: [phone]  Fax: [phone]

## 2022-06-07 ENCOUNTER — Ambulatory Visit: Payer: Managed Care, Other (non HMO) | Admitting: Family Medicine

## 2022-06-23 NOTE — Progress Notes (Unsigned)
Established Patient Office Visit  Subjective    Patient ID: Melinda Cortez, female    DOB: 1981-01-21  Age: 41 y.o. MRN: [id number]  CC:  No chief complaint on file.   HPI Melinda Cortez presents to for follow up.  Snoring:  -Referral placed to sleep medicine  Migraine: -Following with Neurology last seen 02/22/22 -Currently on Nurtec 1 week before and after cycle, Imitrex as needed. Propanolol 120 mg daily for ppx as well.  Seasonal Allergies: -Currently on Zyrtec daily for 5 years, symptoms well controlled.   GERD: -Currently on Prilosec 40 mg daily  -Symptoms well controlled  -EGD 03/21/21 showing 2 cm hiatal hernia   MDD: -Mood status: stable  -Current treatment: Wellbutrin 300 mg XL (started in 2017), Zoloft 100 mg (started in 2015) -Satisfied with current treatment?: yes -Duration of current treatment : years -Side effects: no Medication compliance: excellent compliance Psychotherapy/counseling: yes current     05/13/2022    3:06 PM 04/24/2022   11:41 AM 01/19/2021    3:21 PM 08/16/2020   10:41 AM 05/16/2020   10:00 AM  Depression screen PHQ 2/9  Decreased Interest 0 1 0 0 0  Down, Depressed, Hopeless 0 0 0 0 0  PHQ - 2 Score 0 1 0 0 0  Altered sleeping 0      Tired, decreased energy 0      Change in appetite 0      Feeling bad or failure about yourself  0      Trouble concentrating 0      Moving slowly or fidgety/restless 0      Suicidal thoughts 0      PHQ-9 Score 0      Difficult doing work/chores Not difficult at all       Weight Gain: -Had been working out and eating health in 2009, lost some weight but then started the Zoloft and gained 90 pounds  -Tried No[employer], [employer] -Not currently trying to lose weight -Exercises some - takes yoga classes weekly  -Last A1c 7/23 5.3% -Insurance denied Ozempic prescription   Health Maintenance: -Blood work UTD -Mammogram 8/23 Birads-1 -Pap 6/21 negative   Outpatient Encounter Medications as of  06/24/2022  Medication Sig   acetaminophen (TYLENOL) 500 MG tablet Take 500 mg by mouth every 6 (six) hours as needed.   Ascorbic Acid (VITAMIN C) 1000 MG tablet Take 1,000 mg by mouth daily.   buPROPion (WELLBUTRIN XL) 300 MG 24 hr tablet TAKE 1 TABLET BY MOUTH DAILY   cetirizine (ZYRTEC ALLERGY) 10 MG tablet Take 1 tablet (10 mg total) by mouth daily.   clobetasol cream (TEMOVATE) 1.28 % Apply 1 application topically 2 (two) times daily.   diphenhydrAMINE (BENADRYL) 25 MG tablet Take 25 mg by mouth at bedtime as needed for sleep.   ibuprofen (ADVIL,MOTRIN) 600 MG tablet Take 1 tablet (600 mg total) by mouth every 6 (six) hours as needed for mild pain, moderate pain or cramping.   magnesium 30 MG tablet Take 30 mg by mouth 2 (two) times daily.   Melatonin-Pyridoxine (MELATIN PO) Take by mouth.   Multiple Vitamin (MULTIVITAMIN) tablet Take 1 tablet by mouth daily.   omeprazole (PRILOSEC) 40 MG capsule Take 1 capsule (40 mg total) by mouth daily.   ondansetron (ZOFRAN ODT) 4 MG disintegrating tablet Take 1 tablet (4 mg total) by mouth every 8 (eight) hours as needed for nausea or vomiting.   propranolol ER (INDERAL LA) 120 MG 24 hr capsule  TAKE 1 CAPSULE BY MOUTH DAILY   Rimegepant Sulfate (NURTEC) 75 MG TBDP Take 75 mg by mouth daily as needed.   Semaglutide-Weight Management (WEGOVY) 0.25 MG/0.5ML SOAJ Inject 0.25 mg into the skin once a week.   sertraline (ZOLOFT) 100 MG tablet TAKE 1 TABLET BY MOUTH  DAILY   sodium chloride (OCEAN) 0.65 % SOLN nasal spray Place 1 spray into both nostrils as needed for congestion.   SUMAtriptan (IMITREX) 100 MG tablet Take 1 tablet (100 mg total) by mouth as needed for up to 1 dose for migraine (may repeat after 2 hours.  Maximum 2 tablets in 24 hours.). May repeat in 2 hours if headache persists or recurs.   Zinc-Vitamin C (ZINC-A-COLD/VITAMIN C MT) Use as directed in the mouth or throat.   No facility-administered encounter medications on file as of  06/24/2022.    Past Medical History:  Diagnosis Date   Allergic rhinitis    Anxiety    Chronic headaches    HORMONAL    Complication of anesthesia    Depression    Elevated blood pressure    Elevated blood pressure affecting pregnancy in third trimester, antepartum 10/16/2017   GERD (gastroesophageal reflux disease)    Gestational hypertension 11/15/2017   Migraines    PONV (postoperative nausea and vomiting)     Past Surgical History:  Procedure Laterality Date   DILATION AND CURETTAGE OF UTERUS     DILATION AND EVACUATION N/A 01/07/2017   Procedure: DILATATION AND EVACUATION;  Surgeon: Malachy Mood, MD;  Location: [facility] ORS;  Service: Gynecology;  Laterality: N/A;   TONSILLECTOMY AND ADENOIDECTOMY  1994    Family History  Problem Relation Age of Onset   Hypertension Father    Heart disease Father    Hypertension Brother    Hypertension Brother        prediabetes   Breast cancer Maternal Aunt 40       Mothers aunt   Schizophrenia Maternal Uncle    Lymphoma Paternal Grandfather    Alcoholism Other    Lung cancer Other     Social History   Socioeconomic History   Marital status: Married    Spouse name: Not on file   Number of children: 1   Years of education: Not on file   Highest education level: Not on file  Occupational History   Not on file  Tobacco Use   Smoking status: Former    Packs/day: 0.25    Years: 16.00    Total pack years: 4.00    Types: Cigarettes    Quit date: 01/06/2014    Years since quitting: 8.4   Smokeless tobacco: Never  Vaping Use   Vaping Use: Never used  Substance and Sexual Activity   Alcohol use: Yes    Alcohol/week: 0.0 standard drinks of alcohol    Comment: rare   Drug use: No   Sexual activity: Yes    Birth control/protection: None  Other Topics Concern   Not on file  Social History Narrative   Not on file   Social Determinants of Health   Financial Resource Strain: Not on file  Food Insecurity: Not on file   Transportation Needs: Not on file  Physical Activity: Not on file  Stress: Not on file  Social Connections: Not on file  Intimate Partner Violence: Not on file    Review of Systems  Constitutional:  Negative for chills and fever.  Eyes:  Negative for blurred vision.  Respiratory:  Negative for  shortness of breath.   Cardiovascular:  Negative for chest pain.  Gastrointestinal:  Negative for abdominal pain, heartburn, nausea and vomiting.        Objective    There were no vitals taken for this visit.  Physical Exam Constitutional:      Appearance: Normal appearance. She is obese.  HENT:     Head: Normocephalic and atraumatic.     Mouth/Throat:     Mouth: Mucous membranes are moist.     Pharynx: Oropharynx is clear.  Eyes:     Conjunctiva/sclera: Conjunctivae normal.     Pupils: Pupils are equal, round, and reactive to light.  Cardiovascular:     Rate and Rhythm: Normal rate and regular rhythm.  Pulmonary:     Effort: Pulmonary effort is normal.     Breath sounds: Normal breath sounds.  Musculoskeletal:     Right lower leg: No edema.     Left lower leg: No edema.  Skin:    General: Skin is warm and dry.  Neurological:     General: No focal deficit present.     Mental Status: She is alert. Mental status is at baseline.  Psychiatric:        Mood and Affect: Mood normal.        Behavior: Behavior normal.         Assessment & Plan:   1. Class 3 severe obesity due to excess calories with body mass index (BMI) of 40.0 to 44.9 in adult, unspecified whether serious comorbidity present [facility]): Patient interested in starting something to help with weight loss, already discussed with insurance who will cover [facility]. Discussed all potential side effects and risks of medications. Denies personal or family history of medullary thyroid disease. Discussed how medication has to be coupled with calorie restriction and exercise to be successful. Patient is agreeable. Plan to start  Wegovy 0.25 mg weekly, follow up here in 1 month to recheck.   - Semaglutide-Weight Management (WEGOVY) 0.25 MG/0.5ML SOAJ; Inject 0.25 mg into the skin once a week.  Dispense: 2 mL; Refill: 0  2. Migraine without aura and without status migrainosus, not intractable: Stable, following with Neurology, note and medications from 02/22/22 reviewed.   3. Anxiety and depression: Stable on Wellbutrin 300 mg XL, Zoloft 100 mg daily.   4. Gastroesophageal reflux disease, unspecified whether esophagitis present: Stable, continue Prilosec 40 mg daily, refilled today.   - omeprazole (PRILOSEC) 40 MG capsule; Take 1 capsule (40 mg total) by mouth daily.  Dispense: 90 capsule; Refill: 3  5. Seasonal allergies: Stable, continue Zyrtec, discussed alternating between anti-histamines to prevent less efficacy.    No follow-ups on file.   Teodora Medici, DO

## 2022-06-24 ENCOUNTER — Encounter: Payer: Self-pay | Admitting: Internal Medicine

## 2022-06-24 ENCOUNTER — Ambulatory Visit (INDEPENDENT_AMBULATORY_CARE_PROVIDER_SITE_OTHER): Payer: Managed Care, Other (non HMO) | Admitting: Internal Medicine

## 2022-06-24 VITALS — BP 122/86 | HR 84 | Temp 97.8°F | Resp 18 | Ht 66.0 in | Wt 259.8 lb

## 2022-06-24 DIAGNOSIS — Z23 Encounter for immunization: Secondary | ICD-10-CM

## 2022-06-24 DIAGNOSIS — Z6841 Body Mass Index (BMI) 40.0 and over, adult: Secondary | ICD-10-CM | POA: Diagnosis not present

## 2022-06-24 MED ORDER — WEGOVY 0.5 MG/0.5ML ~~LOC~~ SOAJ: 0.5000 mg | SUBCUTANEOUS | 0 refills | Status: DC

## 2022-06-24 NOTE — Patient Instructions (Signed)
It was great seeing you today!  Plan discussed at today's visit: -Wegovy increased to 0.5 mg weekly  -Continue to work on lifestyle changes   Follow up in: 1 month   Take care and let us know if you have any questions or concerns prior to your next visit.  Dr. Rosana Berger

## 2022-06-25 MED ORDER — OZEMPIC (0.25 OR 0.5 MG/DOSE) 2 MG/3ML ~~LOC~~ SOPN: 0.5000 mg | PEN_INJECTOR | SUBCUTANEOUS | 1 refills | Status: DC

## 2022-06-25 MED ORDER — WEGOVY 0.25 MG/0.5ML ~~LOC~~ SOAJ: 0.2500 mg | SUBCUTANEOUS | 1 refills | Status: DC

## 2022-07-03 NOTE — Telephone Encounter (Signed)
Anita, please advise. thanks 

## 2022-07-04 NOTE — Telephone Encounter (Signed)
I have left a message asking the patient to call me to schedule picking up the HST machine

## 2022-07-05 NOTE — Telephone Encounter (Signed)
I have spoke with Melinda Cortez and she has been scheduled to pick up HST machine on 07/11/22 at 9:30am

## 2022-07-10 ENCOUNTER — Other Ambulatory Visit: Payer: Self-pay | Admitting: Neurology

## 2022-07-11 ENCOUNTER — Ambulatory Visit: Payer: Managed Care, Other (non HMO)

## 2022-07-11 DIAGNOSIS — G4733 Obstructive sleep apnea (adult) (pediatric): Secondary | ICD-10-CM | POA: Diagnosis not present

## 2022-07-11 DIAGNOSIS — G4719 Other hypersomnia: Secondary | ICD-10-CM

## 2022-07-19 DIAGNOSIS — G4733 Obstructive sleep apnea (adult) (pediatric): Secondary | ICD-10-CM | POA: Diagnosis not present

## 2022-07-21 NOTE — Progress Notes (Unsigned)
Established Patient Office Visit  Subjective    Patient ID: Melinda Cortez, female    DOB: [birth year]  Age: 41 y.o. MRN: [id number]  CC:  No chief complaint on file.   HPI BIRDA DIDONATO presents to for follow up on Wegovy.   Snoring:  -Referral placed to sleep medicine, getting set up for home sleep test   Weight Gain: -Had been working out and eating health in 2009, lost some weight but then started the Zoloft and gained 90 pounds  -Tried [employer], [employer] and other programs that have not been successful -Exercises some - takes yoga classes weekly  -Last A1c 7/23 5.3% -Currently on Wegovy 0.5 mg once weekly increased 4 weeks ago - had some nausea at first but is able to tolerate without general issue. Last weight here in August 261, now 259. Patient states she actually gained more weight before starting the medication and according to her scale at home she has actually lost about 9 pounds. She is working on eating healthier in general but has done well with decrease stress eating and snacking and working on portion sizes.    Migraine: -Following with Neurology last seen 02/22/22 -Currently on Nurtec 1 week before and after cycle, Imitrex as needed. Propanolol 120 mg daily for ppx as well.  Seasonal Allergies: -Currently on Zyrtec daily for 5 years, symptoms well controlled.   GERD: -Currently on Prilosec 40 mg daily  -Symptoms well controlled  -EGD 03/21/21 showing 2 cm hiatal hernia   MDD: -Mood status: stable  -Current treatment: Wellbutrin 300 mg XL (started in 2017), Zoloft 100 mg (started in 2015) -Satisfied with current treatment?: yes -Duration of current treatment : years -Side effects: no Medication compliance: excellent compliance Psychotherapy/counseling: yes current     06/24/2022    8:26 AM 05/13/2022    3:06 PM 04/24/2022   11:41 AM 01/19/2021    3:21 PM 08/16/2020   10:41 AM  Depression screen PHQ 2/9  Decreased Interest 0 0 1 0 0  Down,  Depressed, Hopeless 0 0 0 0 0  PHQ - 2 Score 0 0 1 0 0  Altered sleeping 0 0     Tired, decreased energy 0 0     Change in appetite 0 0     Feeling bad or failure about yourself  0 0     Trouble concentrating 0 0     Moving slowly or fidgety/restless 0 0     Suicidal thoughts 0 0     PHQ-9 Score 0 0     Difficult doing work/chores Not difficult at all Not difficult at all      Health Maintenance: -Blood work UTD -Mammogram 8/23 Birads-1 -Pap 6/21 negative   Outpatient Encounter Medications as of 07/22/2022  Medication Sig   acetaminophen (TYLENOL) 500 MG tablet Take 500 mg by mouth every 6 (six) hours as needed.   Ascorbic Acid (VITAMIN C) 1000 MG tablet Take 1,000 mg by mouth daily.   buPROPion (WELLBUTRIN XL) 300 MG 24 hr tablet TAKE 1 TABLET BY MOUTH DAILY   cetirizine (ZYRTEC ALLERGY) 10 MG tablet Take 1 tablet (10 mg total) by mouth daily.   clobetasol cream (TEMOVATE) 9.44 % Apply 1 application topically 2 (two) times daily.   diphenhydrAMINE (BENADRYL) 25 MG tablet Take 25 mg by mouth at bedtime as needed for sleep.   ibuprofen (ADVIL,MOTRIN) 600 MG tablet Take 1 tablet (600 mg total) by mouth every 6 (six) hours as needed for  mild pain, moderate pain or cramping.   magnesium 30 MG tablet Take 30 mg by mouth 2 (two) times daily.   Melatonin-Pyridoxine (MELATIN PO) Take by mouth.   Multiple Vitamin (MULTIVITAMIN) tablet Take 1 tablet by mouth daily.   NURTEC 75 MG TBDP DISSOLVE 1 TABLET ON THE TONGUE  DAILY AS NEEDED   omeprazole (PRILOSEC) 40 MG capsule Take 1 capsule (40 mg total) by mouth daily.   ondansetron (ZOFRAN ODT) 4 MG disintegrating tablet Take 1 tablet (4 mg total) by mouth every 8 (eight) hours as needed for nausea or vomiting.   propranolol ER (INDERAL LA) 120 MG 24 hr capsule TAKE 1 CAPSULE BY MOUTH DAILY   Semaglutide,0.25 or 0.'5MG'$ /DOS, (OZEMPIC, 0.25 OR 0.5 MG/DOSE,) 2 MG/3ML SOPN Inject 0.5 mg into the skin once a week.   Semaglutide-Weight Management  (WEGOVY) 0.25 MG/0.5ML SOAJ Inject 0.25 mg into the skin once a week.   sertraline (ZOLOFT) 100 MG tablet TAKE 1 TABLET BY MOUTH  DAILY   sodium chloride (OCEAN) 0.65 % SOLN nasal spray Place 1 spray into both nostrils as needed for congestion.   SUMAtriptan (IMITREX) 100 MG tablet Take 1 tablet (100 mg total) by mouth as needed for up to 1 dose for migraine (may repeat after 2 hours.  Maximum 2 tablets in 24 hours.). May repeat in 2 hours if headache persists or recurs.   Zinc-Vitamin C (ZINC-A-COLD/VITAMIN C MT) Use as directed in the mouth or throat.   No facility-administered encounter medications on file as of 07/22/2022.    Past Medical History:  Diagnosis Date   Allergic rhinitis    Anxiety    Chronic headaches    HORMONAL    Complication of anesthesia    Depression    Elevated blood pressure    Elevated blood pressure affecting pregnancy in third trimester, antepartum 10/16/2017   GERD (gastroesophageal reflux disease)    Gestational hypertension 11/15/2017   Migraines    PONV (postoperative nausea and vomiting)     Past Surgical History:  Procedure Laterality Date   DILATION AND CURETTAGE OF UTERUS     DILATION AND EVACUATION N/A 01/07/2017   Procedure: DILATATION AND EVACUATION;  Surgeon: Malachy Mood, MD;  Location: [facility] ORS;  Service: Gynecology;  Laterality: N/A;   TONSILLECTOMY AND ADENOIDECTOMY  1994    Family History  Problem Relation Age of Onset   Hypertension Father    Heart disease Father    Hypertension Brother    Hypertension Brother        prediabetes   Breast cancer Maternal Aunt 40       Mothers aunt   Schizophrenia Maternal Uncle    Lymphoma Paternal Grandfather    Alcoholism Other    Lung cancer Other     Social History   Socioeconomic History   Marital status: Married    Spouse name: Not on file   Number of children: 1   Years of education: Not on file   Highest education level: Not on file  Occupational History   Not on file   Tobacco Use   Smoking status: Former    Packs/day: 0.25    Years: 16.00    Total pack years: 4.00    Types: Cigarettes    Quit date: 01/06/2014    Years since quitting: 8.5   Smokeless tobacco: Never  Vaping Use   Vaping Use: Never used  Substance and Sexual Activity   Alcohol use: Yes    Alcohol/week: 0.0 standard drinks  of alcohol    Comment: rare   Drug use: No   Sexual activity: Yes    Birth control/protection: None  Other Topics Concern   Not on file  Social History Narrative   Not on file   Social Determinants of Health   Financial Resource Strain: Not on file  Food Insecurity: Not on file  Transportation Needs: Not on file  Physical Activity: Not on file  Stress: Not on file  Social Connections: Not on file  Intimate Partner Violence: Not on file    Review of Systems  Constitutional:  Negative for chills and fever.  Eyes:  Negative for blurred vision.  Respiratory:  Negative for shortness of breath.   Cardiovascular:  Negative for chest pain.  Gastrointestinal:  Negative for abdominal pain, constipation, diarrhea, heartburn, nausea and vomiting.        Objective    LMP 05/25/2022   Physical Exam Constitutional:      Appearance: Normal appearance. She is obese.  HENT:     Head: Normocephalic and atraumatic.     Mouth/Throat:     Mouth: Mucous membranes are moist.     Pharynx: Oropharynx is clear.  Eyes:     Conjunctiva/sclera: Conjunctivae normal.     Pupils: Pupils are equal, round, and reactive to light.  Cardiovascular:     Rate and Rhythm: Normal rate and regular rhythm.  Pulmonary:     Effort: Pulmonary effort is normal.     Breath sounds: Normal breath sounds.  Skin:    General: Skin is warm and dry.  Neurological:     General: No focal deficit present.     Mental Status: She is alert. Mental status is at baseline.  Psychiatric:        Mood and Affect: Mood normal.        Behavior: Behavior normal.       Assessment & Plan:    1. Class 3 severe obesity due to excess calories with body mass index (BMI) of 40.0 to 44.9 in adult, unspecified whether serious comorbidity present [employer]): Doing well with starter dose, will increase to Wegovy 0.5 mg weekly for another 4 weeks. Continue to work on lifestyle changes and follow up in 1 month for weight check.   - Semaglutide-Weight Management (WEGOVY) 0.5 MG/0.5ML SOAJ; Inject 0.5 mg into the skin once a week.  Dispense: 2 mL; Refill: 0  2. Need for influenza vaccination: Flu vaccine administered today.   - Flu Vaccine QUAD 6+ mos PF IM (Fluarix Quad PF)   No follow-ups on file.   Teodora Medici, DO

## 2022-07-22 ENCOUNTER — Ambulatory Visit: Payer: Managed Care, Other (non HMO) | Admitting: Internal Medicine

## 2022-07-22 ENCOUNTER — Encounter: Payer: Self-pay | Admitting: Internal Medicine

## 2022-07-22 ENCOUNTER — Other Ambulatory Visit: Payer: Self-pay

## 2022-07-22 VITALS — BP 124/82 | HR 84 | Temp 98.2°F | Resp 18 | Ht 66.0 in | Wt 262.3 lb

## 2022-07-22 DIAGNOSIS — G4733 Obstructive sleep apnea (adult) (pediatric): Secondary | ICD-10-CM | POA: Diagnosis not present

## 2022-07-22 DIAGNOSIS — Z6841 Body Mass Index (BMI) 40.0 and over, adult: Secondary | ICD-10-CM | POA: Diagnosis not present

## 2022-07-22 NOTE — Patient Instructions (Addendum)
It was great seeing you today!  Plan discussed at today's visit: -Continue Ozempic, if you need anything in this regard please let me know -Please plan on fasting at least 8-12 hours for follow up   Follow up in: 6 months   Take care and let us know if you have any questions or concerns prior to your next visit.  Dr. Rosana Berger

## 2022-08-01 ENCOUNTER — Telehealth: Payer: Managed Care, Other (non HMO) | Admitting: Adult Health

## 2022-08-02 ENCOUNTER — Encounter: Payer: Self-pay | Admitting: Adult Health

## 2022-08-02 ENCOUNTER — Telehealth: Payer: Managed Care, Other (non HMO) | Admitting: Adult Health

## 2022-08-02 DIAGNOSIS — G4733 Obstructive sleep apnea (adult) (pediatric): Secondary | ICD-10-CM | POA: Diagnosis not present

## 2022-08-02 NOTE — Patient Instructions (Signed)
Begin CPAP At bedtime  , wear all night long for 6hr or more each night  Work on healthy weight loss Do not drive if sleepy Healthy sleep regimen Follow-up in 3 months with Dr. Halford Chessman  or Hisham Provence NP and As needed

## 2022-08-02 NOTE — Progress Notes (Signed)
Reviewed and agree with assessment/plan.   Chesley Mires, MD [facility] Pulmonary/Critical Care 08/02/2022, 11:17 AM Pager:  [phone]

## 2022-08-02 NOTE — Progress Notes (Signed)
Virtual Visit via Video Note  I connected with Melinda Cortez on 08/02/22 at 10:30 AM EST by a video enabled telemedicine application and verified that I am speaking with the correct person using two identifiers.  Location: Patient: Home  Provider: Office    I discussed the limitations of evaluation and management by telemedicine and the availability of in person appointments. The patient expressed understanding and agreed to proceed.  History of Present Illness: 41 year old female seen for sleep consult May 28, 2022 for snoring, restless sleep and daytime sleepiness found to have moderate obstructive sleep apnea  Today's virtual visit is a 43-monthfollow-up.  Patient was seen last visit for a sleep consult.  She had snoring, restless sleep and daytime sleepiness.  She was set up for home sleep study that was completed on July 12, 2022 that showed moderate sleep apnea with AHI 23.2/hour and SPO2 low at 83%.  We discussed her sleep study results in detail.  Patient education was given.  We discussed treatment options including weight loss oral appliance and CPAP therapy.  Patient will proceed with CPAP therapy  Past Medical History:  Diagnosis Date   Allergic rhinitis    Anxiety    Chronic headaches    HORMONAL    Complication of anesthesia    Depression    Elevated blood pressure    Elevated blood pressure affecting pregnancy in third trimester, antepartum 10/16/2017   GERD (gastroesophageal reflux disease)    Gestational hypertension 11/15/2017   Migraines    PONV (postoperative nausea and vomiting)     Current Outpatient Medications on File Prior to Visit  Medication Sig Dispense Refill   acetaminophen (TYLENOL) 500 MG tablet Take 500 mg by mouth every 6 (six) hours as needed.     Ascorbic Acid (VITAMIN C) 1000 MG tablet Take 1,000 mg by mouth daily.     buPROPion (WELLBUTRIN XL) 300 MG 24 hr tablet TAKE 1 TABLET BY MOUTH DAILY 90 tablet 3   cetirizine (ZYRTEC ALLERGY)  10 MG tablet Take 1 tablet (10 mg total) by mouth daily. 30 tablet 0   clobetasol cream (TEMOVATE) 01.61% Apply 1 application topically 2 (two) times daily. 60 each 1   diphenhydrAMINE (BENADRYL) 25 MG tablet Take 25 mg by mouth at bedtime as needed for sleep.     ibuprofen (ADVIL,MOTRIN) 600 MG tablet Take 1 tablet (600 mg total) by mouth every 6 (six) hours as needed for mild pain, moderate pain or cramping. 30 tablet 0   magnesium 30 MG tablet Take 30 mg by mouth 2 (two) times daily.     Melatonin-Pyridoxine (MELATIN PO) Take by mouth.     Multiple Vitamin (MULTIVITAMIN) tablet Take 1 tablet by mouth daily.     NURTEC 75 MG TBDP DISSOLVE 1 TABLET ON THE TONGUE  DAILY AS NEEDED 48 tablet 1   omeprazole (PRILOSEC) 40 MG capsule Take 1 capsule (40 mg total) by mouth daily. 90 capsule 3   ondansetron (ZOFRAN ODT) 4 MG disintegrating tablet Take 1 tablet (4 mg total) by mouth every 8 (eight) hours as needed for nausea or vomiting. 20 tablet 4   propranolol ER (INDERAL LA) 120 MG 24 hr capsule TAKE 1 CAPSULE BY MOUTH DAILY 90 capsule 1   Semaglutide,0.25 or 0.'5MG'$ /DOS, (OZEMPIC, 0.25 OR 0.5 MG/DOSE,) 2 MG/3ML SOPN Inject 0.5 mg into the skin once a week. 2 mL 1   Semaglutide-Weight Management (WEGOVY) 0.25 MG/0.5ML SOAJ Inject 0.25 mg into the skin once a week. (  Patient not taking: Reported on 07/22/2022) 2 mL 1   sertraline (ZOLOFT) 100 MG tablet TAKE 1 TABLET BY MOUTH  DAILY 90 tablet 3   sodium chloride (OCEAN) 0.65 % SOLN nasal spray Place 1 spray into both nostrils as needed for congestion.     SUMAtriptan (IMITREX) 100 MG tablet Take 1 tablet (100 mg total) by mouth as needed for up to 1 dose for migraine (may repeat after 2 hours.  Maximum 2 tablets in 24 hours.). May repeat in 2 hours if headache persists or recurs. 10 tablet 5   Zinc-Vitamin C (ZINC-A-COLD/VITAMIN C MT) Use as directed in the mouth or throat.     No current facility-administered medications on file prior to visit.       Observations/Objective: Home sleep study July 12, 2022 showed AHI 23.2/hour and SPO2 low at 83%  Assessment and Plan: Moderate obstructive sleep apnea.  Patient education was given on sleep apnea and treatment options including weight loss oral appliance and CPAP therapy.  Patient begin CPAP AutoSet 5 to 15 cm H2O.  - discussed how weight can impact sleep and risk for sleep disordered breathing - discussed options to assist with weight loss: combination of diet modification, cardiovascular and strength training exercises   - had an extensive discussion regarding the adverse health consequences related to untreated sleep disordered breathing - specifically discussed the risks for hypertension, coronary artery disease, cardiac dysrhythmias, cerebrovascular disease, and diabetes - lifestyle modification discussed   - discussed how sleep disruption can increase risk of accidents, particularly when driving - safe driving practices were discussed     Plan  Patient Instructions  Begin CPAP At bedtime  , wear all night long for 6hr or more each night  Work on healthy weight loss Do not drive if sleepy Healthy sleep regimen Follow-up in 3 months with Dr. Halford Chessman  or Ysabel Stankovich NP and As needed       Follow Up Instructions:    I discussed the assessment and treatment plan with the patient. The patient was provided an opportunity to ask questions and all were answered. The patient agreed with the plan and demonstrated an understanding of the instructions.   The patient was advised to call back or seek an in-person evaluation if the symptoms worsen or if the condition fails to improve as anticipated.  I provided 22  minutes of non-face-to-face time during this encounter.   Rexene Edison, NP

## 2022-08-02 NOTE — Addendum Note (Signed)
Addended by: Vanessa Barbara on: 08/02/2022 01:25 PM   Modules accepted: Orders

## 2022-08-19 ENCOUNTER — Ambulatory Visit (INDEPENDENT_AMBULATORY_CARE_PROVIDER_SITE_OTHER): Payer: 59 | Admitting: Psychology

## 2022-08-19 DIAGNOSIS — F4323 Adjustment disorder with mixed anxiety and depressed mood: Secondary | ICD-10-CM

## 2022-08-19 NOTE — Progress Notes (Signed)
[facility] Counselor Initial Adult Exam  Name: Melinda Cortez Date: 08/19/2022 MRN: [id number] DOB: [birth year] PCP: Teodora Medici, DO  Time spent: 4:00pm - 4:55pm    55 minutes  Guardian/Payee:  n/a    Paperwork requested: No   Reason for Visit /Presenting Problem: Pt present for face-to-face initial assessment update via video Webex.  Pt consents to telehealth video session due to COVID 19 pandemic. Location of pt: home Location of therapist: home office.  Pt states she has had a lot of stressors to include her 48 yo dog passing away and pt's father in law getting sick and ultimately passing away in August.  Addressed pt's grief and helped pt process her feelings.    Pt is having trouble with motivation regarding taking care of herself  and maintaining a healthy weight.   Pt struggles at times with family dynamics in her family of origin and concern about her alcoholic brother.   Reviewed pt's treatment plan for annual update.   Updated pt's treatment plan and IA.  Pt participated in setting treatment goals.   Plan to meet monthly.   Mental Status Exam: Appearance:   Casual     Behavior:  Appropriate  Motor:  Normal  Speech/Language:   Normal Rate  Affect:  Appropriate  Mood:  normal  Thought process:  normal  Thought content:    WNL  Sensory/Perceptual disturbances:    WNL  Orientation:  oriented to person, place, time/date, and situation  Attention:  Good  Concentration:  Good  Memory:  WNL  Fund of knowledge:   Good  Insight:    Good  Judgment:   Good  Impulse Control:  Good     Reported Symptoms:  stress, sadness  Risk Assessment: Danger to Self:  No Self-injurious Behavior: No Danger to Others: No Duty to Warn:no Physical Aggression / Violence:No  Access to Firearms a concern: No  Gang Involvement:No  Patient / guardian was educated about steps to take if suicide or homicide risk level increases between visits: n/a While future  psychiatric events cannot be accurately predicted, the patient does not currently require acute inpatient psychiatric care and does not currently meet [facility] involuntary commitment criteria.  Substance Abuse History: Current substance abuse: No     Past Psychiatric History:   Previous psychological history is significant for anxiety and depression Outpatient Providers:Pt has been in therapy in the past. History of Psych Hospitalization: No  Psychological Testing:  n/a    Abuse History:  Victim of: No.,  n/a    Report needed: No. Victim of Neglect:No. Perpetrator of  n/a   Witness / Exposure to Domestic Violence: No   Protective Services Involvement: No  Witness to [employer] Violence:  No   Family History:  Family History  Problem Relation Age of Onset   Hypertension Father    Heart disease Father    Hypertension Brother    Hypertension Brother        prediabetes   Breast cancer Maternal Aunt 17       Mothers aunt   Schizophrenia Maternal Uncle    Lymphoma Paternal Grandfather    Alcoholism Other    Lung cancer Other     Living situation: the patient lives with her husband and 2 children.    Family history of depression and anxiety and substance abuse. Pt's brother is alcoholic and plays victim role per pt.   Pt grew up with parents and three brothers.  Pt is youngest  and only girl .  Pt states she has always been labeled irrational bc she is a girl and more emotional.   Mental illness on mother's side of family.    Pt feels there was more pressure put on her growing up to be perfect and get good grades bc she was the only girl.   Double standard regarding brothers.    Sexual Orientation: Straight  Relationship Status: married  Name of spouse / other:Chris If a parent, number of children / ages:pt has 2 children ages 27 (daughter)  and 18 (son).  Support Systems: spouse parents  Financial Stress:  No   Income/Employment/Disability: Employment  Production manager: No   Educational History: Education: Scientist, product/process development: Protestant  Any cultural differences that may affect / interfere with treatment:  not applicable   Recreation/Hobbies: reading  Stressors: Loss of dog and father in law.   Marital or family conflict    Strengths: Supportive Relationships, Hopefulness, Self Advocate, and Able to Communicate Effectively  Barriers:  none   Legal History: Pending legal issue / charges: The patient has no significant history of legal issues. History of legal issue / charges:  n/a  Medical History/Surgical History: reviewed Past Medical History:  Diagnosis Date   Allergic rhinitis    Anxiety    Chronic headaches    HORMONAL    Complication of anesthesia    Depression    Elevated blood pressure    Elevated blood pressure affecting pregnancy in third trimester, antepartum 10/16/2017   GERD (gastroesophageal reflux disease)    Gestational hypertension 11/15/2017   Migraines    PONV (postoperative nausea and vomiting)     Past Surgical History:  Procedure Laterality Date   DILATION AND CURETTAGE OF UTERUS     DILATION AND EVACUATION N/A 01/07/2017   Procedure: DILATATION AND EVACUATION;  Surgeon: Malachy Mood, MD;  Location: [facility] ORS;  Service: Gynecology;  Laterality: N/A;   TONSILLECTOMY AND ADENOIDECTOMY  1994    Medications: Current Outpatient Medications  Medication Sig Dispense Refill   acetaminophen (TYLENOL) 500 MG tablet Take 500 mg by mouth every 6 (six) hours as needed.     Ascorbic Acid (VITAMIN C) 1000 MG tablet Take 1,000 mg by mouth daily.     buPROPion (WELLBUTRIN XL) 300 MG 24 hr tablet TAKE 1 TABLET BY MOUTH DAILY 90 tablet 3   cetirizine (ZYRTEC ALLERGY) 10 MG tablet Take 1 tablet (10 mg total) by mouth daily. 30 tablet 0   clobetasol cream (TEMOVATE) 7.16 % Apply 1 application topically 2 (two) times daily. 60 each 1   diphenhydrAMINE (BENADRYL) 25 MG tablet Take 25 mg  by mouth at bedtime as needed for sleep.     ibuprofen (ADVIL,MOTRIN) 600 MG tablet Take 1 tablet (600 mg total) by mouth every 6 (six) hours as needed for mild pain, moderate pain or cramping. 30 tablet 0   magnesium 30 MG tablet Take 30 mg by mouth 2 (two) times daily.     Melatonin-Pyridoxine (MELATIN PO) Take by mouth.     Multiple Vitamin (MULTIVITAMIN) tablet Take 1 tablet by mouth daily.     NURTEC 75 MG TBDP DISSOLVE 1 TABLET ON THE TONGUE  DAILY AS NEEDED 48 tablet 1   omeprazole (PRILOSEC) 40 MG capsule Take 1 capsule (40 mg total) by mouth daily. 90 capsule 3   ondansetron (ZOFRAN ODT) 4 MG disintegrating tablet Take 1 tablet (4 mg total) by mouth every 8 (eight) hours as needed for  nausea or vomiting. 20 tablet 4   propranolol ER (INDERAL LA) 120 MG 24 hr capsule TAKE 1 CAPSULE BY MOUTH DAILY 90 capsule 1   Semaglutide,0.25 or 0.'5MG'$ /DOS, (OZEMPIC, 0.25 OR 0.5 MG/DOSE,) 2 MG/3ML SOPN Inject 0.5 mg into the skin once a week. 2 mL 1   Semaglutide-Weight Management (WEGOVY) 0.25 MG/0.5ML SOAJ Inject 0.25 mg into the skin once a week. (Patient not taking: Reported on 07/22/2022) 2 mL 1   sertraline (ZOLOFT) 100 MG tablet TAKE 1 TABLET BY MOUTH  DAILY 90 tablet 3   sodium chloride (OCEAN) 0.65 % SOLN nasal spray Place 1 spray into both nostrils as needed for congestion.     SUMAtriptan (IMITREX) 100 MG tablet Take 1 tablet (100 mg total) by mouth as needed for up to 1 dose for migraine (may repeat after 2 hours.  Maximum 2 tablets in 24 hours.). May repeat in 2 hours if headache persists or recurs. 10 tablet 5   Zinc-Vitamin C (ZINC-A-COLD/VITAMIN C MT) Use as directed in the mouth or throat.     No current facility-administered medications for this visit.    No Known Allergies  Diagnoses:  F43.23  Plan of Care: Treatment Plan Client Abilities/Strengths  Pt is bright, engaging, and motivated for therapy.  Client Treatment Preferences  Individual therapy.  Client Statement of Needs   Improve copings skills and understand herself better. Improve self esteem.  Symptoms  Depressed or irritable mood. Excessive and/or unrealistic worry that is difficult to control occurring more days than not for at least 6 months about a number of events or activities. Hypervigilance (e.g., feeling constantly on edge, experiencing concentration difficulties, having trouble falling or staying asleep, exhibiting a general state of irritability). Low self-esteem. Problems Addressed  Unipolar Depression, Anxiety Goals 1. Alleviate depressive symptoms and return to previous level of effective functioning. 2. Appropriately grieve the loss in order to normalize mood and to return to previously adaptive level of functioning. Objective Learn and implement behavioral strategies to overcome depression. Target Date: 2023-08-20 Frequency: Monthly  Progress: 60 Modality: individual  Related Interventions Assist the client in developing skills that increase the likelihood of deriving pleasure from behavioral activation (e.g., assertiveness skills, developing an exercise plan, less internal/more external focus, increased social involvement); reinforce success. Engage the client in "behavioral activation," increasing his/her activity level and contact with sources of reward, while identifying processes that inhibit activation. use behavioral techniques such as instruction, rehearsal, role-playing, role reversal, as needed, to facilitate activity in the client's daily life; reinforce success. 3. Develop healthy interpersonal relationships that lead to the alleviation and help prevent the relapse of depression. 4. Develop healthy thinking patterns and beliefs about self, others, and the world that lead to the alleviation and help prevent the relapse of depression. 5. Enhance ability to effectively cope with the full variety of life's worries and anxieties. 6. Learn and implement coping skills that result in a  reduction of anxiety and worry, and improved daily functioning. Objective Learn and implement problem-solving strategies for realistically addressing worries. Target Date: 2023-08-20 Frequency: Monthly  Progress: 60 Modality: individual  Related Interventions Assign the client a homework exercise in which he/she problem-solves a current problem (see Mastery of Your Anxiety and Worry: Workbook by Adora Fridge and Eliot Ford or Generalized Anxiety Disorder by Eather Colas, and Eliot Ford); review, reinforce success, and provide corrective feedback toward improvement. Teach the client problem-solving strategies involving specifically defining a problem, generating options for addressing it, evaluating the pros and cons of each option,  selecting and implementing an optional action, and reevaluating and refining the action. Objective Learn and implement calming skills to reduce overall anxiety and manage anxiety symptoms. Target Date: 2023-08-20 Frequency: Monthly  Progress: 60 Modality: individual  Related Interventions Assign the client to read about progressive muscle relaxation and other calming strategies in relevant books or treatment manuals (e.g., Progressive Relaxation Training by Leroy Kennedy; Mastery of Your Anxiety and Worry: Workbook by Beckie Busing). Assign the client homework each session in which he/she practices relaxation exercises daily, gradually applying them progressively from non-anxiety-provoking to anxiety-provoking situations; review and reinforce success while providing corrective feedback toward improvement. Teach the client calming/relaxation skills (e.g., applied relaxation, progressive muscle relaxation, cue controlled relaxation; mindful breathing; biofeedback) and how to discriminate better between relaxation and tension; teach the client how to apply these skills to his/her daily life. 7. Recognize, accept, and cope with feelings of depression. 8. Reduce overall  frequency, intensity, and duration of the anxiety so that daily functioning is not impaired. 9. Resolve the core conflict that is the source of anxiety. 10. Stabilize anxiety level while increasing ability to function on a daily basis. Diagnosis F43.23 Conditions For Discharge Achievement of treatment goals and objectives    Clint Bolder, LCSW

## 2022-08-20 NOTE — Telephone Encounter (Signed)
Will forward to PCC[facility] to help with the order. Thanks.

## 2022-08-22 NOTE — Telephone Encounter (Signed)
Our log shows CMN was sent to [place] on 11/14 & we faxed it back on 11/15.  I called [facility] & speak to [place].  She found the CMN and states pt was already contacted today and has been scheduled.  Nothing further needed.  Will route back to triage to have message closed.

## 2022-08-22 NOTE — Progress Notes (Signed)
NEUROLOGY FOLLOW UP OFFICE NOTE  Melinda Cortez [id number]  Assessment/Plan:   Menstrually related migraine, without status migrainosus, not intractable     Migraine prevention:  propranolol ER to '120mg'$  daily.  Will see if migraines improve once OSA is treated. Migraine rescue:  sumatriptan '100mg'$ .  Zofran ODT '4mg'$  For perimenstrual prophylaxis, take Nurtec '75mg'$  daily for 14 days beginning first day of period. Limit use of pain relievers to no more than 2 days out of week to prevent risk of rebound or medication-overuse headache. Keep headache diary Follow up 6 months     Subjective:  Melinda Cortez is a 41 year old female with depression and anxiety who follows up for headache and bilateral leg pain/weakness.   UPDATE: Migraine: Change from rizatriptan to sumatriptan '100mg'$  Nurtec perimenstrual Diagnosed with OSA.  Will be getting an APAP on Wednesday.    Intensity:  mild to severe Duration:  an hour.  50% of time needs to repeat dose.   Frequency:  5 to 7 days in last 30 days (1 or 2 occur during week of period and 1 to 2 the following week)     Perimenstrual prophylaxis:  take Nurtec '75mg'$  daily for 14 days beginning first day of period.  Current NSAIDS/analgesics:  Tylenol Current triptans:  sumatriptan '100mg'$  Current ergotamine:  none Current anti-emetic:  Zofran ODT '4mg'$  Current muscle relaxants:  none Current Antihypertensive medications:  propranolol ER '120mg'$  daily Current Antidepressant medications:  sertraline '100mg'$  QD, Wellbutrin XL '300mg'$  QD Current Anticonvulsant medications:  none Current anti-CGRP:  Nurtec (perimenstrual prophylaxis) Current Vitamins/Herbal/Supplements:  none Current Antihistamines/Decongestants:  Zyrtec, Benadryl Other therapy:  heating pad Hormone/birth control:  none      Caffeine:  Diet soda on weekends.  Occasional coffee. Alcohol:  on occasion Smoker:  no Diet:  close to 64 oz water daily.  Skips breakfast Exercise:   yes Depression:  yes; Anxiety:  yes - treated Other pain: knee pain Sleep:  Recently diagnosed with OSA.  Plan to start APAP.     HISTORY:  Onset:  late 7s - early 32s, worse over past 2 years. Location:  left retro-orbital Quality:  pressure/stabbing Intensity:  Mild and severe.   Aura:  absent Prodrome:  absent Associated symptoms:  Nausea, vomiting, photophobia, phonophobia, osmophobia, feels like she may not have "full vision" but no blurred vision.  She denies associated  unilateral numbness or weakness. Duration:  severe pain lasts 1 day with lingering mild headache for 6 days - used to occur during week of menstrual period but more recently occurring week after or 2 weeks after Frequency:  once a month Frequency of abortive medication: 7 days a month Triggers:  hormonal Relieving factors:  heating pad on head, sleep in dark room Activity:  movement/activity aggravates She was seen in the ED in May 2022 for intractable migraine.  Due to nausea, wasn't able to keep her Relpax down.  Received headache cocktail which helped.   She had her son in 2019.  She received an epidural.  Since then, she has had intermittent shooting pains down both legs, typically anterior thigh.  She does report some low back pain.  No leg weakness or numbness.  No bowel or bladder dysfunction.  For low back and bilateral leg pain, she was referred to physical therapy.  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  She was referred to neurosurgery.  They stated there wasn't anything surgical but she may respond to an injection.  However, she exhibited hyperreflexes in the upper as well as lower extremities.   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  MRI of T-spine revealed central disc protrusion at T11-2 and small left paracentral disc protrusion at T10-11 both causing mild cord flattening but without cord signal changes.       Past NSAIDS/analgesics:  ibuprofen, meloxicam Past abortive triptans:  eletriptan, rizatriptan Past abortive ergotamine:  none Past muscle relaxants:  none Past anti-emetic:  Reglan, Compazine, promethazine Past antihypertensive medications:  labetolol Past antidepressant medications:  none Past anticonvulsant medications:  none Past anti-CGRP:  none Past vitamins/Herbal/Supplements:  none Past antihistamines/decongestants:  hydroxyzine Other past therapies:  prednisone     Family history of headache:  brother, father, paternal grandmother.  Reports that her brother was diagnosed with "brain atrophy".  He is an alcoholic.    PAST MEDICAL HISTORY: Past Medical History:  Diagnosis Date   Allergic rhinitis    Anxiety    Chronic headaches    HORMONAL    Complication of anesthesia    Depression    Elevated blood pressure    Elevated blood pressure affecting pregnancy in third trimester, antepartum 10/16/2017   GERD (gastroesophageal reflux disease)    Gestational hypertension 11/15/2017   Migraines    PONV (postoperative nausea and vomiting)     MEDICATIONS: Current Outpatient Medications on File Prior to Visit  Medication Sig Dispense Refill   acetaminophen (TYLENOL) 500 MG tablet Take 500 mg by mouth every 6 (six) hours as needed.     Ascorbic Acid (VITAMIN C) 1000 MG tablet Take 1,000 mg by mouth daily.     buPROPion (WELLBUTRIN XL) 300 MG 24 hr tablet TAKE 1 TABLET BY MOUTH DAILY 90 tablet 3   cetirizine (ZYRTEC ALLERGY) 10 MG tablet Take 1 tablet (10 mg total) by mouth daily. 30 tablet 0   clobetasol cream (TEMOVATE) 9.70 % Apply 1 application topically 2 (two)  times daily. 60 each 1   diphenhydrAMINE (BENADRYL) 25 MG tablet Take 25 mg by mouth at bedtime as needed for sleep.     ibuprofen (ADVIL,MOTRIN) 600 MG tablet Take 1 tablet (600 mg total) by mouth every 6 (six) hours as needed for mild pain, moderate pain or cramping. 30 tablet 0   magnesium 30 MG tablet Take 30 mg by mouth 2 (two) times daily.     Melatonin-Pyridoxine (MELATIN PO) Take by mouth.     Multiple Vitamin (MULTIVITAMIN) tablet Take 1 tablet by mouth daily.     NURTEC 75 MG TBDP DISSOLVE 1 TABLET ON THE TONGUE  DAILY AS NEEDED 48 tablet 1   omeprazole (PRILOSEC) 40 MG capsule Take 1 capsule (40 mg total) by mouth daily. 90 capsule 3   ondansetron (ZOFRAN ODT) 4 MG disintegrating tablet Take 1 tablet (4 mg total) by mouth every 8 (eight) hours as needed for nausea or vomiting. 20 tablet 4   propranolol ER (INDERAL LA) 120 MG 24 hr capsule TAKE 1 CAPSULE BY MOUTH DAILY 90 capsule 1   Semaglutide,0.25 or 0.'5MG'$ /DOS, (OZEMPIC, 0.25 OR 0.5 MG/DOSE,) 2 MG/3ML SOPN Inject 0.5 mg into the skin once a week. 2 mL  1   Semaglutide-Weight Management (WEGOVY) 0.25 MG/0.5ML SOAJ Inject 0.25 mg into the skin once a week. (Patient not taking: Reported on 07/22/2022) 2 mL 1   sertraline (ZOLOFT) 100 MG tablet TAKE 1 TABLET BY MOUTH  DAILY 90 tablet 3   sodium chloride (OCEAN) 0.65 % SOLN nasal spray Place 1 spray into both nostrils as needed for congestion.     SUMAtriptan (IMITREX) 100 MG tablet Take 1 tablet (100 mg total) by mouth as needed for up to 1 dose for migraine (may repeat after 2 hours.  Maximum 2 tablets in 24 hours.). May repeat in 2 hours if headache persists or recurs. 10 tablet 5   Zinc-Vitamin C (ZINC-A-COLD/VITAMIN C MT) Use as directed in the mouth or throat.     No current facility-administered medications on file prior to visit.    ALLERGIES: No Known Allergies  FAMILY HISTORY: Family History  Problem Relation Age of Onset   Hypertension Father    Heart disease Father     Hypertension Brother    Hypertension Brother        prediabetes   Breast cancer Maternal Aunt 40       Mothers aunt   Schizophrenia Maternal Uncle    Lymphoma Paternal Grandfather    Alcoholism Other    Lung cancer Other       Objective:  BP 110/80. Pulse 80, resp. rate 18, height '5\' 6"'$  (1.676 m), weight 252 lb (114.3 kg), peak flow 97 L/min. General: No acute distress.  Patient appears well-groomed.      Metta Clines, DO  CC: Wells Guiles, DO

## 2022-08-26 ENCOUNTER — Ambulatory Visit (INDEPENDENT_AMBULATORY_CARE_PROVIDER_SITE_OTHER): Payer: Managed Care, Other (non HMO) | Admitting: Neurology

## 2022-08-26 ENCOUNTER — Encounter: Payer: Self-pay | Admitting: Neurology

## 2022-08-26 VITALS — BP 110/80 | HR 80 | Resp 18 | Ht 66.0 in | Wt 252.0 lb

## 2022-08-26 DIAGNOSIS — G43829 Menstrual migraine, not intractable, without status migrainosus: Secondary | ICD-10-CM | POA: Diagnosis not present

## 2022-08-26 MED ORDER — NURTEC 75 MG PO TBDP: ORAL_TABLET | ORAL | 1 refills | Status: DC

## 2022-08-26 NOTE — Patient Instructions (Signed)
Take Nurtec '75mg'$  daily for 14 days beginning first day of period. Continue propranolol ER '120mg'$  daily Sumatriptan as needed. Limit use of pain relievers to no more than 2 days out of week to prevent risk of rebound or medication-overuse headache. Start treatment for sleep apnea Follow up 6 months.

## 2022-09-10 ENCOUNTER — Ambulatory Visit (INDEPENDENT_AMBULATORY_CARE_PROVIDER_SITE_OTHER): Payer: Managed Care, Other (non HMO) | Admitting: Dermatology

## 2022-09-10 ENCOUNTER — Encounter: Payer: Self-pay | Admitting: Dermatology

## 2022-09-10 VITALS — BP 104/74 | HR 80

## 2022-09-10 DIAGNOSIS — Z1283 Encounter for screening for malignant neoplasm of skin: Secondary | ICD-10-CM | POA: Diagnosis not present

## 2022-09-10 DIAGNOSIS — L700 Acne vulgaris: Secondary | ICD-10-CM | POA: Diagnosis not present

## 2022-09-10 DIAGNOSIS — L821 Other seborrheic keratosis: Secondary | ICD-10-CM

## 2022-09-10 DIAGNOSIS — D2261 Melanocytic nevi of right upper limb, including shoulder: Secondary | ICD-10-CM | POA: Diagnosis not present

## 2022-09-10 DIAGNOSIS — L900 Lichen sclerosus et atrophicus: Secondary | ICD-10-CM | POA: Diagnosis not present

## 2022-09-10 DIAGNOSIS — L304 Erythema intertrigo: Secondary | ICD-10-CM

## 2022-09-10 DIAGNOSIS — D229 Melanocytic nevi, unspecified: Secondary | ICD-10-CM

## 2022-09-10 DIAGNOSIS — L814 Other melanin hyperpigmentation: Secondary | ICD-10-CM

## 2022-09-10 DIAGNOSIS — L578 Other skin changes due to chronic exposure to nonionizing radiation: Secondary | ICD-10-CM

## 2022-09-10 MED ORDER — KETOCONAZOLE 2 % EX CREA: TOPICAL_CREAM | CUTANEOUS | 3 refills | Status: DC

## 2022-09-10 MED ORDER — HYDROCORTISONE 2.5 % EX CREA: TOPICAL_CREAM | CUTANEOUS | 1 refills | Status: DC

## 2022-09-10 NOTE — Patient Instructions (Addendum)
Intertrigo   Intertrigo is a chronic recurrent rash that occurs in skin fold areas that may be associated with friction; heat; moisture; yeast; fungus; and bacteria.  It is exacerbated by increased movement / activity; sweating; and higher atmospheric temperature.   Mix hydrocortisone with ketaconazole 2% twice a day. If improved, decrease to hydrocortisone and ketaconazole mixed once a day. If still clear, decrease to ketaconazole only.     Melanoma ABCDEs  Melanoma is the most dangerous type of skin cancer, and is the leading cause of death from skin disease.  You are more likely to develop melanoma if you: Have light-colored skin, light-colored eyes, or red or blond hair Spend a lot of time in the sun Tan regularly, either outdoors or in a tanning bed Have had blistering sunburns, especially during childhood Have a close family member who has had a melanoma Have atypical moles or large birthmarks  Early detection of melanoma is key since treatment is typically straightforward and cure rates are extremely high if we catch it early.   The first sign of melanoma is often a change in a mole or a new dark spot.  The ABCDE system is a way of remembering the signs of melanoma.  A for asymmetry:  The two halves do not match. B for border:  The edges of the growth are irregular. C for color:  A mixture of colors are present instead of an even brown color. D for diameter:  Melanomas are usually (but not always) greater than 81m - the size of a pencil eraser. E for evolution:  The spot keeps changing in size, shape, and color.  Please check your skin once per month between visits. You can use a small mirror in front and a large mirror behind you to keep an eye on the back side or your body.   If you see any new or changing lesions before your next follow-up, please call to schedule a visit.  Please continue daily skin protection including broad spectrum sunscreen SPF 30+ to sun-exposed  areas, reapplying every 2 hours as needed when you're outdoors.   Staying in the shade or wearing long sleeves, sun glasses (UVA+UVB protection) and wide brim hats (4-inch brim around the entire circumference of the hat) are also recommended for sun protection.    Due to recent changes in healthcare laws, you may see results of your pathology and/or laboratory studies on MyChart before the doctors have had a chance to review them. We understand that in some cases there may be results that are confusing or concerning to you. Please understand that not all results are received at the same time and often the doctors may need to interpret multiple results in order to provide you with the best plan of care or course of treatment. Therefore, we ask that you please give u[place]2 business days to thoroughly review all your results before contacting the office for clarification. Should we see a critical lab result, you will be contacted sooner.   If You Need Anything After Your Visit  If you have any questions or concerns for your doctor, please call our main line at [phone] press option 4 to reach your doctor's medical assistant. If no one answers, please leave a voicemail as directed and we will return your call as soon as possible. Messages left after 4 pm will be answered the following business day.   You may also send [place] message via [place] We typically respond to  MyChart messages within 1-2 business days.  For prescription refills, please ask your pharmacy to contact our office. Our fax number is [phone].  If you have an urgent issue when the clinic is closed that cannot wait until the next business day, you can page your doctor at the number below.    Please note that while we do our best to be available for urgent issues outside of office hours, we are not available 24/7.   If you have an urgent issue and are unable to reach [place], you may choose to seek medical care at your doctor's  office, retail clinic, urgent care center, or emergency room.  If you have a medical emergency, please immediately call 911 or go to the emergency department.  Pager Numbers  - Dr. Nehemiah Massed: [phone]  - Dr. Laurence Ferrari: [phone]  - Dr. Nicole Kindred: [phone]  In the event of inclement weather, please call our main line at [phone] for an update on the status of any delays or closures.  Dermatology Medication Tips: Please keep the boxes that topical medications come in in order to help keep track of the instructions about where and how to use these. Pharmacies typically print the medication instructions only on the boxes and not directly on the medication tubes.   If your medication is too expensive, please contact our office at [phone] option 4 or send [place] a message through [place].   We are unable to tell what your co-pay for medications will be in advance as this is different depending on your insurance coverage. However, we may be able to find a substitute medication at lower cost or fill out paperwork to get insurance to cover a needed medication.   If a prior authorization is required to get your medication covered by your insurance company, please allow [place] 1-2 business days to complete this process.  Drug prices often vary depending on where the prescription is filled and some pharmacies may offer cheaper prices.  The website www.goodrx.com contains coupons for medications through different pharmacies. The prices here do not account for what the cost may be with help from insurance (it may be cheaper with your insurance), but the website can give you the price if you did not use any insurance.  - You can print the associated coupon and take it with your prescription to the pharmacy.  - You may also stop by our office during regular business hours and pick up a GoodRx coupon card.  - If you need your prescription sent electronically to a different pharmacy, notify our office  through [facility] or by phone at [phone] option 4.     Si Usted Necesita Algo Despus de Su Visita  Tambin puede enviarnos un mensaje a travs de Pharmacist, community. Por lo general respondemos a los mensajes de MyChart en el transcurso de 1 a 2 das hbiles.  Para renovar recetas, por favor pida a su farmacia que se ponga en contacto con nuestra oficina. Harland Dingwall de fax es [place] [phone].  Si tiene un asunto urgente cuando la clnica est cerrada y que no puede esperar hasta el siguiente da hbil, puede llamar/localizar a su doctor(a) al nmero que aparece a continuacin.   Por favor, tenga en cuenta que aunque hacemos todo lo posible para estar disponibles para asuntos urgentes fuera del horario de [place], no estamos disponibles las 24 horas del da, los 7 das de la [place].   Si tiene un problema urgente y no puede comunicarse con nosotros, puede  optar por buscar atencin mdica  en el consultorio de su doctor(a), en una clnica privada, en un centro de atencin urgente o en una sala de emergencias.  Si tiene Engineering geologist, por favor llame inmediatamente al 911 o vaya a la sala de emergencias.  Nmeros de bper  - Dr. Nehemiah Massed: [phone]  - Dra. Moye: [phone]  - Dra. Nicole Kindred: [phone]  En caso de inclemencias del [place], por favor llame a Johnsie Kindred principal al [phone] para una actualizacin sobre el [place] de cualquier retraso o cierre.  Consejos para la medicacin en dermatologa: Por favor, guarde las cajas en las que vienen los medicamentos de uso tpico para ayudarle a seguir las instrucciones sobre dnde y cmo usarlos. Las farmacias generalmente imprimen las instrucciones del medicamento slo en las cajas y no directamente en los tubos del [place].   Si su medicamento es muy caro, por favor, pngase en contacto con Zigmund Daniel llamando al [phone] y presione la opcin 4 o envenos un mensaje a travs de Pharmacist, community.   No  podemos decirle cul ser su copago por los medicamentos por adelantado ya que esto es diferente dependiendo de la cobertura de su seguro. Sin embargo, es posible que podamos encontrar un medicamento sustituto a Electrical engineer un formulario para que el seguro cubra el medicamento que se considera necesario.   Si se requiere una autorizacin previa para que su compaa de seguros [place] su medicamento, por favor permtanos de 1 a 2 das hbiles para completar este proceso.  Los precios de los medicamentos varan con frecuencia dependiendo del Environmental consultant de dnde se surte la receta y alguna farmacias pueden ofrecer precios ms baratos.  El sitio web www.goodrx.com tiene cupones para medicamentos de Airline pilot. Los precios aqu no tienen en cuenta lo que podra costar con la ayuda del seguro (puede ser ms barato con su seguro), pero el sitio web puede darle el precio si no utiliz Research scientist (physical sciences).  - Puede imprimir el cupn correspondiente y llevarlo con su receta a la farmacia.  - Tambin puede pasar por nuestra oficina durante el horario de atencin regular y Charity fundraiser una tarjeta de cupones de GoodRx.  - Si necesita que su receta se enve electrnicamente a una farmacia diferente, informe a nuestra oficina a travs de MyChart de [place] o por telfono llamando al [phone] y presione la opcin 4.

## 2022-09-10 NOTE — Progress Notes (Signed)
Follow-Up Visit   Subjective  Melinda Cortez is a 41 y.o. female who presents for the following: Annual Exam (1 year tbse. Reports irritated spot at gluteal crease for the last couple of months. ). H/o LS&A- not active, only uses clobetasol sporadically, maybe once a month if that.  The patient presents for Total-Body Skin Exam (TBSE) for skin cancer screening and mole check.  The patient has spots, moles and lesions to be evaluated, some may be new or changing and the patient has concerns that these could be cancer.  The following portions of the chart were reviewed this encounter and updated as appropriate:      Review of Systems: No other skin or systemic complaints except as noted in HPI or Assessment and Plan.   Objective  Well appearing patient in no apparent distress; mood and affect are within normal limits.  A full examination was performed including scalp, head, eyes, ears, nose, lips, neck, chest, axillae, abdomen, back, buttocks, bilateral upper extremities, bilateral lower extremities, hands, feet, fingers, toes, fingernails, and toenails. All findings within normal limits unless otherwise noted below.  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 Currently well-controlled.   Symptoms controlled with topical clobetasol once monthly   Lichen sclerosus is a chronic inflammatory condition of unknown cause that frequently  involves the vaginal area and less commonly extragenital skin, and is NOT sexually transmitted. It frequently causes symptoms of pain and burning.  It requires regular monitoring and treatment with topical steroids to minimize inflammation and to reduce risk of scarring. There is also a risk of cancer in the vaginal area which is very low if inflammation is well controlled. Regular checks of the area are recommended. Please call if you notice any new or changing spots within this area.   Continue clobetasol prn symptom control, can use up to 2 times daily as needed for flares    Topical steroids (such as triamcinolone, fluocinolone, fluocinonide, mometasone, clobetasol, halobetasol, betamethasone, hydrocortisone) can cause thinning and lightening of the skin if they are used for too long in the same area. Your physician has selected the right strength medicine for your problem and area affected on the body. Please use your medication only as directed by your physician to prevent side effects.  Acne vulgaris face  With mild/mod flare. Discussed treatment.  Patient deferred treatment at this time.   Nevus Right Shoulder - Posterior  Benign-appearing.  Observation.  Call clinic for new or changing moles.  Recommend daily use of broad spectrum spf 30+ sunscreen to sun-exposed areas.    Seborrheic keratosis Left Shoulder - Posterior  Vs lentigo   Reassured benign age-related growth.  Recommend observation.  Discussed cryotherapy if spot(s) become irritated or inflamed.  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 Condition is bothersome/symptomatic for patient. Currently flared.   Intertrigo is a  chronic recurrent rash that occurs in skin fold areas that may be associated with friction; heat; moisture; yeast; fungus; and bacteria.  It is exacerbated by increased movement / activity;  sweating; and higher atmospheric temperature.  Start Ketoconazole 2 % cream qd/bid as directed Start Hydrocortisone 2.5 % cream qd/bid as directed  Mix hydrocortisone with ketaconazole 2% twice a day. If improved, decrease to hydrocortisone and ketaconazole mixed once a day. If still clear, decrease to ketaconazole only.  Can use vaseline, aquaphor, or zinc oxide paste as skin protectant during day if continues to flare   hydrocortisone 2.5 % cream - lower gluteal cleft superior perirectal area Mix with ketoconazole 2 % and apply topically to affected areas bid. If improved use cream once daily to aa. If clear use Ketoconazole cream only to affected area.  ketoconazole (NIZORAL) 2 % cream - lower gluteal cleft superior perirectal area Mix with hydrocortisone 2.5 % cream and use to aa bid. If improved use once daily. If still clear use ketoconazole cream to aa only.  Lentigo left mid cheek  Vs sk  Benign-appearing. Stable compared to previous visit. Observation.  Call clinic for new or changing moles.  Recommend daily use of broad spectrum spf 30+ sunscreen to sun-exposed areas.    Lentigines - Scattered tan macules - Due to sun exposure - Benign-appearing, observe - Recommend daily broad spectrum sunscreen SPF 30+ to sun-exposed areas, reapply every 2 hours as needed. - Call for any changes  Seborrheic Keratoses - Stuck-on, waxy, tan-brown papules and/or plaques  - Benign-appearing - Discussed benign etiology and prognosis. - Observe - Call for any changes  Melanocytic Nevi - Tan-brown and/or pink-flesh-colored symmetric macules and papules - Benign appearing on exam today - Observation - Call clinic for new or changing moles - Recommend daily use of broad spectrum spf 30+ sunscreen to sun-exposed areas.   Hemangiomas - Red papules - Discussed benign nature - Observe - Call for any changes  Actinic Damage - Chronic condition, secondary to cumulative UV/sun  exposure - diffuse scaly erythematous macules with underlying dyspigmentation - Recommend daily broad spectrum sunscreen SPF 30+ to sun-exposed areas, reapply every 2 hours as needed.  - Staying in the shade or wearing long sleeves, sun glasses (UVA+UVB protection) and wide brim hats (4-inch brim around the entire circumference of the hat) are also recommended for sun protection.  - Call for new or changing lesions.  Skin cancer screening performed today. Return in about 1 year (around 09/11/2023) for TBSE. I, Ruthell Rummage, CMA, am acting as scribe for Brendolyn Patty, MD.  Documentation: I have reviewed the above documentation for accuracy and completeness, and I agree with the above.  Brendolyn Patty MD

## 2022-09-25 ENCOUNTER — Other Ambulatory Visit: Payer: Self-pay | Admitting: Neurology

## 2022-10-24 ENCOUNTER — Ambulatory Visit (INDEPENDENT_AMBULATORY_CARE_PROVIDER_SITE_OTHER): Payer: 59 | Admitting: Psychology

## 2022-10-24 DIAGNOSIS — F4323 Adjustment disorder with mixed anxiety and depressed mood: Secondary | ICD-10-CM

## 2022-10-24 NOTE — Progress Notes (Signed)
[facility] Counselor/Therapist Progress Note  Patient ID: Melinda Cortez, MRN: [id number],    Date: 10/24/2022  Time Spent: 4:00pm-4:55pm    55 minutes   Treatment Type: Individual Therapy  Reported Symptoms: stress, worry  Mental Status Exam: Appearance:  Casual     Behavior: Appropriate  Motor: Normal  Speech/Language:  Normal Rate  Affect: Appropriate  Mood: normal  Thought process: normal  Thought content:   WNL  Sensory/Perceptual disturbances:   WNL  Orientation: oriented to person, place, time/date, and situation  Attention: Good  Concentration: Good  Memory: WNL  Fund of knowledge:  Good  Insight:   Good  Judgment:  Good  Impulse Control: Good   Risk Assessment: Danger to Self:  No Self-injurious Behavior: No Danger to Others: No Duty to Warn:no Physical Aggression / Violence:No  Access to Firearms a concern: No  Gang Involvement:No   Subjective: Pt present for face-to-face individual therapy via video Webex.  Pt consents to telehealth video session due to COVID 19 pandemic. Location of pt: home Location of therapist: home office.   Pt talked about issues with her brother.   He called their mother and admitted that he is an alcoholic.  Last week pt's brother called her and they talked for 3 hours.   Pt's brother had been drinking and gave pt the news that he and his wife are separating.   Pt's brother's daughters told their mother she needs to leave him bc of his anger and verbal abusiveness.   This is very upsetting to pt.   Pt and her parents and other brothers have talked about the situation and are trying to come up with what they can do to be supportive.   The family is thinking about doing an intervention with pt's brother.  Helped pt with resources for substance abuse treatment.  Helped pt process her feelings and family dynamics.  Recommend pt and family attend [facility].   Provided supportive therapy.     Interventions: Cognitive Behavioral  Therapy and Insight-Oriented  Diagnosis: F43.23  Plan of Care: Treatment Plan (target date: 08/20/2023) Client Abilities/Strengths  Pt is bright, engaging, and motivated for therapy.  Client Treatment Preferences  Individual therapy.  Client Statement of Needs  Improve copings skills and understand herself better. Improve self esteem.  Symptoms  Depressed or irritable mood. Excessive and/or unrealistic worry that is difficult to control occurring more days than not for at least 6 months about a number of events or activities. Hypervigilance (e.g., feeling constantly on edge, experiencing concentration difficulties, having trouble falling or staying asleep, exhibiting a general state of irritability). Low self-esteem. Problems Addressed  Unipolar Depression, Anxiety Goals 1. Alleviate depressive symptoms and return to previous level of effective functioning. 2. Appropriately grieve the loss in order to normalize mood and to return to previously adaptive level of functioning. Objective Learn and implement behavioral strategies to overcome depression. Target Date: 2023-08-20 Frequency: Monthly  Progress: 60 Modality: individual  Related Interventions Assist the client in developing skills that increase the likelihood of deriving pleasure from behavioral activation (e.g., assertiveness skills, developing an exercise plan, less internal/more external focus, increased social involvement); reinforce success. Engage the client in "behavioral activation," increasing his/her activity level and contact with sources of reward, while identifying processes that inhibit activation. use behavioral techniques such as instruction, rehearsal, role-playing, role reversal, as needed, to facilitate activity in the client's daily life; reinforce success. 3. Develop healthy interpersonal relationships that lead to the alleviation and help prevent  the relapse of depression. 4. Develop healthy thinking patterns and  beliefs about self, others, and the world that lead to the alleviation and help prevent the relapse of depression. 5. Enhance ability to effectively cope with the full variety of life's worries and anxieties. 6. Learn and implement coping skills that result in a reduction of anxiety and worry, and improved daily functioning. Objective Learn and implement problem-solving strategies for realistically addressing worries. Target Date: 2023-08-20 Frequency: Monthly  Progress: 60 Modality: individual  Related Interventions Assign the client a homework exercise in which he/she problem-solves a current problem (see Mastery of Your Anxiety and Worry: Workbook by Adora Fridge and Eliot Ford or Generalized Anxiety Disorder by Eather Colas, and Eliot Ford); review, reinforce success, and provide corrective feedback toward improvement. Teach the client problem-solving strategies involving specifically defining a problem, generating options for addressing it, evaluating the pros and cons of each option, selecting and implementing an optional action, and reevaluating and refining the action. Objective Learn and implement calming skills to reduce overall anxiety and manage anxiety symptoms. Target Date: 2023-08-20 Frequency: Monthly  Progress: 60 Modality: individual  Related Interventions Assign the client to read about progressive muscle relaxation and other calming strategies in relevant books or treatment manuals (e.g., Progressive Relaxation Training by Leroy Kennedy; Mastery of Your Anxiety and Worry: Workbook by Beckie Busing). Assign the client homework each session in which he/she practices relaxation exercises daily, gradually applying them progressively from non-anxiety-provoking to anxiety-provoking situations; review and reinforce success while providing corrective feedback toward improvement. Teach the client calming/relaxation skills (e.g., applied relaxation, progressive muscle relaxation, cue  controlled relaxation; mindful breathing; biofeedback) and how to discriminate better between relaxation and tension; teach the client how to apply these skills to his/her daily life. 7. Recognize, accept, and cope with feelings of depression. 8. Reduce overall frequency, intensity, and duration of the anxiety so that daily functioning is not impaired. 9. Resolve the core conflict that is the source of anxiety. 10. Stabilize anxiety level while increasing ability to function on a daily basis. Diagnosis F43.23 Conditions For Discharge Achievement of treatment goals and objectives   Clint Bolder, LCSW

## 2022-11-04 ENCOUNTER — Ambulatory Visit (INDEPENDENT_AMBULATORY_CARE_PROVIDER_SITE_OTHER): Payer: 59 | Admitting: Psychology

## 2022-11-04 DIAGNOSIS — F4323 Adjustment disorder with mixed anxiety and depressed mood: Secondary | ICD-10-CM | POA: Diagnosis not present

## 2022-11-04 NOTE — Progress Notes (Signed)
[place] Counselor/Therapist Progress Note  Patient ID: Melinda Cortez, MRN: [id number],    Date: 11/04/2022  Time Spent: 10:00am-10:55am     55 minutes   Treatment Type: Individual Therapy  Reported Symptoms: stress, worry  Mental Status Exam: Appearance:  Casual     Behavior: Appropriate  Motor: Normal  Speech/Language:  Normal Rate  Affect: Appropriate  Mood: normal  Thought process: normal  Thought content:   WNL  Sensory/Perceptual disturbances:   WNL  Orientation: oriented to person, place, time/date, and situation  Attention: Good  Concentration: Good  Memory: WNL  Fund of knowledge:  Good  Insight:   Good  Judgment:  Good  Impulse Control: Good   Risk Assessment: Danger to Self:  No Self-injurious Behavior: No Danger to Others: No Duty to Warn:no Physical Aggression / Violence:No  Access to Firearms a concern: No  Gang Involvement:No   Subjective: Pt present for face-to-face individual therapy via video Webex.  Pt consents to telehealth video session due to COVID 19 pandemic. Location of pt: home Location of therapist: home office.   Pt talked about the issues with her brother.   Pt and parents are working on doing an intervention with pt's brother.   Pt's brother's wife is moving out this week so pt is concerned about how her brother will react to that.   Addressed pt's concerns about her brother.  Pt is also concerned about how the stress of her brother's alcoholism affects her parents.   Pt has had 5 people in her life lose their father's this past year so pt is especially concerned about losing hers.   Helped pt process her feelings and worries.   Pt talked to her family about going to an alanon meeting and they plan to go in the future.  Pt has listened to a podcast on alanon that she found interesting and helpful.   Provided supportive therapy.     Interventions: Cognitive Behavioral Therapy and Insight-Oriented  Diagnosis:  F43.23  Plan of Care: Treatment Plan (target date: 08/20/2023) Client Abilities/Strengths  Pt is bright, engaging, and motivated for therapy.  Client Treatment Preferences  Individual therapy.  Client Statement of Needs  Improve copings skills and understand herself better. Improve self esteem.  Symptoms  Depressed or irritable mood. Excessive and/or unrealistic worry that is difficult to control occurring more days than not for at least 6 months about a number of events or activities. Hypervigilance (e.g., feeling constantly on edge, experiencing concentration difficulties, having trouble falling or staying asleep, exhibiting a general state of irritability). Low self-esteem. Problems Addressed  Unipolar Depression, Anxiety Goals 1. Alleviate depressive symptoms and return to previous level of effective functioning. 2. Appropriately grieve the loss in order to normalize mood and to return to previously adaptive level of functioning. Objective Learn and implement behavioral strategies to overcome depression. Target Date: 2023-08-20 Frequency: Monthly  Progress: 60 Modality: individual  Related Interventions Assist the client in developing skills that increase the likelihood of deriving pleasure from behavioral activation (e.g., assertiveness skills, developing an exercise plan, less internal/more external focus, increased social involvement); reinforce success. Engage the client in "behavioral activation," increasing his/her activity level and contact with sources of reward, while identifying processes that inhibit activation. use behavioral techniques such as instruction, rehearsal, role-playing, role reversal, as needed, to facilitate activity in the client's daily life; reinforce success. 3. Develop healthy interpersonal relationships that lead to the alleviation and help prevent the relapse of depression. 4. Develop healthy thinking  patterns and beliefs about self, others, and the world  that lead to the alleviation and help prevent the relapse of depression. 5. Enhance ability to effectively cope with the full variety of life's worries and anxieties. 6. Learn and implement coping skills that result in a reduction of anxiety and worry, and improved daily functioning. Objective Learn and implement problem-solving strategies for realistically addressing worries. Target Date: 2023-08-20 Frequency: Monthly  Progress: 60 Modality: individual  Related Interventions Assign the client a homework exercise in which he/she problem-solves a current problem (see Mastery of Your Anxiety and Worry: Workbook by Adora Fridge and Eliot Ford or Generalized Anxiety Disorder by Eather Colas, and Eliot Ford); review, reinforce success, and provide corrective feedback toward improvement. Teach the client problem-solving strategies involving specifically defining a problem, generating options for addressing it, evaluating the pros and cons of each option, selecting and implementing an optional action, and reevaluating and refining the action. Objective Learn and implement calming skills to reduce overall anxiety and manage anxiety symptoms. Target Date: 2023-08-20 Frequency: Monthly  Progress: 60 Modality: individual  Related Interventions Assign the client to read about progressive muscle relaxation and other calming strategies in relevant books or treatment manuals (e.g., Progressive Relaxation Training by Leroy Kennedy; Mastery of Your Anxiety and Worry: Workbook by Beckie Busing). Assign the client homework each session in which he/she practices relaxation exercises daily, gradually applying them progressively from non-anxiety-provoking to anxiety-provoking situations; review and reinforce success while providing corrective feedback toward improvement. Teach the client calming/relaxation skills (e.g., applied relaxation, progressive muscle relaxation, cue controlled relaxation; mindful breathing;  biofeedback) and how to discriminate better between relaxation and tension; teach the client how to apply these skills to his/her daily life. 7. Recognize, accept, and cope with feelings of depression. 8. Reduce overall frequency, intensity, and duration of the anxiety so that daily functioning is not impaired. 9. Resolve the core conflict that is the source of anxiety. 10. Stabilize anxiety level while increasing ability to function on a daily basis. Diagnosis F43.23 Conditions For Discharge Achievement of treatment goals and objectives   Clint Bolder, LCSW

## 2022-11-22 ENCOUNTER — Ambulatory Visit (INDEPENDENT_AMBULATORY_CARE_PROVIDER_SITE_OTHER): Payer: 59 | Admitting: Psychology

## 2022-11-22 DIAGNOSIS — F4323 Adjustment disorder with mixed anxiety and depressed mood: Secondary | ICD-10-CM

## 2022-11-22 NOTE — Progress Notes (Signed)
[place] Counselor/Therapist Progress Note  Patient ID: Melinda Cortez, MRN: [id number],    Date: 11/22/2022  Time Spent: 9:00am-9:55am     55 minutes   Treatment Type: Individual Therapy  Reported Symptoms: stress, worry  Mental Status Exam: Appearance:  Casual     Behavior: Appropriate  Motor: Normal  Speech/Language:  Normal Rate  Affect: Appropriate  Mood: normal  Thought process: normal  Thought content:   WNL  Sensory/Perceptual disturbances:   WNL  Orientation: oriented to person, place, time/date, and situation  Attention: Good  Concentration: Good  Memory: WNL  Fund of knowledge:  Good  Insight:   Good  Judgment:  Good  Impulse Control: Good   Risk Assessment: Danger to Self:  No Self-injurious Behavior: No Danger to Others: No Duty to Warn:no Physical Aggression / Violence:No  Access to Firearms a concern: No  Gang Involvement:No   Subjective: Pt present for face-to-face individual therapy via video Webex.  Pt consents to telehealth video session due to COVID 19 pandemic. Location of pt: home Location of therapist: home office.   Pt talked about the issues with her brother.   His wife moved out and her brother is doing ok with it so far.  Pt is still worried about her brother especially regarding his alcoholism.  Addressed pt's concerns. Helped pt process her feelings and worries.   Pt has listened to a podcast on alanon that she found interesting and helpful.   Pt talked about her frustrations with her weight.  She saw a provider at a weight loss program who was prescribed Ozempic for weight loss.  Pt has been on the medication since late October.  Pt states the medication is helping her and she has lost 30 lbs.   Pt comes from a family with addiction and eating disorder issues.  Pt's mother has bulimia.  Worked on self care strategies.   Provided supportive therapy.     Interventions: Cognitive Behavioral Therapy and  Insight-Oriented  Diagnosis: F43.23  Plan of Care: Treatment Plan (target date: 08/20/2023) Client Abilities/Strengths  Pt is bright, engaging, and motivated for therapy.  Client Treatment Preferences  Individual therapy.  Client Statement of Needs  Improve copings skills and understand herself better. Improve self esteem.  Symptoms  Depressed or irritable mood. Excessive and/or unrealistic worry that is difficult to control occurring more days than not for at least 6 months about a number of events or activities. Hypervigilance (e.g., feeling constantly on edge, experiencing concentration difficulties, having trouble falling or staying asleep, exhibiting a general state of irritability). Low self-esteem. Problems Addressed  Unipolar Depression, Anxiety Goals 1. Alleviate depressive symptoms and return to previous level of effective functioning. 2. Appropriately grieve the loss in order to normalize mood and to return to previously adaptive level of functioning. Objective Learn and implement behavioral strategies to overcome depression. Target Date: 2023-08-20 Frequency: Monthly  Progress: 60 Modality: individual  Related Interventions Assist the client in developing skills that increase the likelihood of deriving pleasure from behavioral activation (e.g., assertiveness skills, developing an exercise plan, less internal/more external focus, increased social involvement); reinforce success. Engage the client in "behavioral activation," increasing his/her activity level and contact with sources of reward, while identifying processes that inhibit activation. use behavioral techniques such as instruction, rehearsal, role-playing, role reversal, as needed, to facilitate activity in the client's daily life; reinforce success. 3. Develop healthy interpersonal relationships that lead to the alleviation and help prevent the relapse of depression. 4. Develop healthy  thinking patterns and beliefs  about self, others, and the world that lead to the alleviation and help prevent the relapse of depression. 5. Enhance ability to effectively cope with the full variety of life's worries and anxieties. 6. Learn and implement coping skills that result in a reduction of anxiety and worry, and improved daily functioning. Objective Learn and implement problem-solving strategies for realistically addressing worries. Target Date: 2023-08-20 Frequency: Monthly  Progress: 60 Modality: individual  Related Interventions Assign the client a homework exercise in which he/she problem-solves a current problem (see Mastery of Your Anxiety and Worry: Workbook by Adora Fridge and Eliot Ford or Generalized Anxiety Disorder by Eather Colas, and Eliot Ford); review, reinforce success, and provide corrective feedback toward improvement. Teach the client problem-solving strategies involving specifically defining a problem, generating options for addressing it, evaluating the pros and cons of each option, selecting and implementing an optional action, and reevaluating and refining the action. Objective Learn and implement calming skills to reduce overall anxiety and manage anxiety symptoms. Target Date: 2023-08-20 Frequency: Monthly  Progress: 60 Modality: individual  Related Interventions Assign the client to read about progressive muscle relaxation and other calming strategies in relevant books or treatment manuals (e.g., Progressive Relaxation Training by Leroy Kennedy; Mastery of Your Anxiety and Worry: Workbook by Beckie Busing). Assign the client homework each session in which he/she practices relaxation exercises daily, gradually applying them progressively from non-anxiety-provoking to anxiety-provoking situations; review and reinforce success while providing corrective feedback toward improvement. Teach the client calming/relaxation skills (e.g., applied relaxation, progressive muscle relaxation, cue controlled  relaxation; mindful breathing; biofeedback) and how to discriminate better between relaxation and tension; teach the client how to apply these skills to his/her daily life. 7. Recognize, accept, and cope with feelings of depression. 8. Reduce overall frequency, intensity, and duration of the anxiety so that daily functioning is not impaired. 9. Resolve the core conflict that is the source of anxiety. 10. Stabilize anxiety level while increasing ability to function on a daily basis. Diagnosis F43.23 Conditions For Discharge Achievement of treatment goals and objectives   Clint Bolder, LCSW

## 2022-12-10 ENCOUNTER — Ambulatory Visit (INDEPENDENT_AMBULATORY_CARE_PROVIDER_SITE_OTHER): Payer: 59 | Admitting: Psychology

## 2022-12-10 DIAGNOSIS — F4323 Adjustment disorder with mixed anxiety and depressed mood: Secondary | ICD-10-CM | POA: Diagnosis not present

## 2022-12-10 NOTE — Progress Notes (Signed)
[place] Counselor/Therapist Progress Note  Patient ID: Melinda Cortez, MRN: [id number],    Date: 12/10/2022  Time Spent: 10:00am-10:55am     55 minutes   Treatment Type: Individual Therapy  Reported Symptoms: stress, worry  Mental Status Exam: Appearance:  Casual     Behavior: Appropriate  Motor: Normal  Speech/Language:  Normal Rate  Affect: Appropriate  Mood: normal  Thought process: normal  Thought content:   WNL  Sensory/Perceptual disturbances:   WNL  Orientation: oriented to person, place, time/date, and situation  Attention: Good  Concentration: Good  Memory: WNL  Fund of knowledge:  Good  Insight:   Good  Judgment:  Good  Impulse Control: Good   Risk Assessment: Danger to Self:  No Self-injurious Behavior: No Danger to Others: No Duty to Warn:no Physical Aggression / Violence:No  Access to Firearms a concern: No  Gang Involvement:No   Subjective: Pt present for face-to-face individual therapy via video Webex.  Pt consents to telehealth video session due to COVID 19 pandemic. Location of pt: home Location of therapist: home office.   Pt talked about her brother Aaron Edelman.  He has been drinking and the family is concerned about him.  Pt went to talk with her brother and encourage him to go to rehab.  Pt's husband Gerald Stabs and father were there as well.   Pt had to mediate things between Aaron Edelman and their father.   [name]'s wife and kids showed up when pt and family were trying to get Aaron Edelman to detox/rehab and there was a lot of drama.   Addressed the interactions.  Helped pt process her feelings and family dynamics.  Aaron Edelman made it to detox.  This is his first time in detox but decided to not go to rehab.   Pt and family are very disappointed that Aaron Edelman did not go to rehab.   Pt thinks her brother has started to drink again.   Worked on self care strategies.   Provided supportive therapy.     Interventions: Cognitive Behavioral Therapy and  Insight-Oriented  Diagnosis: F43.23  Plan of Care: Treatment Plan (target date: 08/20/2023) Client Abilities/Strengths  Pt is bright, engaging, and motivated for therapy.  Client Treatment Preferences  Individual therapy.  Client Statement of Needs  Improve copings skills and understand herself better. Improve self esteem.  Symptoms  Depressed or irritable mood. Excessive and/or unrealistic worry that is difficult to control occurring more days than not for at least 6 months about a number of events or activities. Hypervigilance (e.g., feeling constantly on edge, experiencing concentration difficulties, having trouble falling or staying asleep, exhibiting a general state of irritability). Low self-esteem. Problems Addressed  Unipolar Depression, Anxiety Goals 1. Alleviate depressive symptoms and return to previous level of effective functioning. 2. Appropriately grieve the loss in order to normalize mood and to return to previously adaptive level of functioning. Objective Learn and implement behavioral strategies to overcome depression. Target Date: 2023-08-20 Frequency: Monthly  Progress: 60 Modality: individual  Related Interventions Assist the client in developing skills that increase the likelihood of deriving pleasure from behavioral activation (e.g., assertiveness skills, developing an exercise plan, less internal/more external focus, increased social involvement); reinforce success. Engage the client in "behavioral activation," increasing his/her activity level and contact with sources of reward, while identifying processes that inhibit activation. use behavioral techniques such as instruction, rehearsal, role-playing, role reversal, as needed, to facilitate activity in the client's daily life; reinforce success. 3. Develop healthy interpersonal relationships that lead to the  alleviation and help prevent the relapse of depression. 4. Develop healthy thinking patterns and beliefs  about self, others, and the world that lead to the alleviation and help prevent the relapse of depression. 5. Enhance ability to effectively cope with the full variety of life's worries and anxieties. 6. Learn and implement coping skills that result in a reduction of anxiety and worry, and improved daily functioning. Objective Learn and implement problem-solving strategies for realistically addressing worries. Target Date: 2023-08-20 Frequency: Monthly  Progress: 60 Modality: individual  Related Interventions Assign the client a homework exercise in which he/she problem-solves a current problem (see Mastery of Your Anxiety and Worry: Workbook by Adora Fridge and Eliot Ford or Generalized Anxiety Disorder by Eather Colas, and Eliot Ford); review, reinforce success, and provide corrective feedback toward improvement. Teach the client problem-solving strategies involving specifically defining a problem, generating options for addressing it, evaluating the pros and cons of each option, selecting and implementing an optional action, and reevaluating and refining the action. Objective Learn and implement calming skills to reduce overall anxiety and manage anxiety symptoms. Target Date: 2023-08-20 Frequency: Monthly  Progress: 60 Modality: individual  Related Interventions Assign the client to read about progressive muscle relaxation and other calming strategies in relevant books or treatment manuals (e.g., Progressive Relaxation Training by Leroy Kennedy; Mastery of Your Anxiety and Worry: Workbook by Beckie Busing). Assign the client homework each session in which he/she practices relaxation exercises daily, gradually applying them progressively from non-anxiety-provoking to anxiety-provoking situations; review and reinforce success while providing corrective feedback toward improvement. Teach the client calming/relaxation skills (e.g., applied relaxation, progressive muscle relaxation, cue controlled  relaxation; mindful breathing; biofeedback) and how to discriminate better between relaxation and tension; teach the client how to apply these skills to his/her daily life. 7. Recognize, accept, and cope with feelings of depression. 8. Reduce overall frequency, intensity, and duration of the anxiety so that daily functioning is not impaired. 9. Resolve the core conflict that is the source of anxiety. 10. Stabilize anxiety level while increasing ability to function on a daily basis. Diagnosis F43.23 Conditions For Discharge Achievement of treatment goals and objectives   Clint Bolder, LCSW

## 2023-01-07 ENCOUNTER — Ambulatory Visit (INDEPENDENT_AMBULATORY_CARE_PROVIDER_SITE_OTHER): Payer: 59 | Admitting: Psychology

## 2023-01-07 DIAGNOSIS — F4323 Adjustment disorder with mixed anxiety and depressed mood: Secondary | ICD-10-CM

## 2023-01-07 NOTE — Progress Notes (Signed)
[facility] Counselor/Therapist Progress Note  Patient ID: Melinda Cortez, MRN: [id number],    Date: 01/07/2023  Time Spent: 11:00am-11:55am     55 minutes   Treatment Type: Individual Therapy  Reported Symptoms: stress, worry  Mental Status Exam: Appearance:  Casual     Behavior: Appropriate  Motor: Normal  Speech/Language:  Normal Rate  Affect: Appropriate  Mood: normal  Thought process: normal  Thought content:   WNL  Sensory/Perceptual disturbances:   WNL  Orientation: oriented to person, place, time/date, and situation  Attention: Good  Concentration: Good  Memory: WNL  Fund of knowledge:  Good  Insight:   Good  Judgment:  Good  Impulse Control: Good   Risk Assessment: Danger to Self:  No Self-injurious Behavior: No Danger to Others: No Duty to Warn:no Physical Aggression / Violence:No  Access to Firearms a concern: No  Gang Involvement:No   Subjective: Pt present for face-to-face individual therapy via video Webex.  Pt consents to telehealth video session due to COVID 19 pandemic. Location of pt: home Location of therapist: home office.   Pt talked about her brother [name].   He was drinking again but went back to detox after he moved his things out of his house.  [name] lost his job and is staying at a hotel but reached out to family stating that his funds are drying up and he may end up homeless.  This was very upsetting to pt.  The family helped [name] access a residential rehab facility.  Addressed pt's concerns about her brother. Helped pt process her feelings.  Pt feels some relief that her brother is in rehab.  Encouraged pt to stay connected with [facility]. Worked on self care strategies.   Provided supportive therapy.     Interventions: Cognitive Behavioral Therapy and Insight-Oriented  Diagnosis: F43.23  Plan of Care: Treatment Plan (target date: 08/20/2023) Client Abilities/Strengths  Pt is bright, engaging, and motivated for therapy.   Client Treatment Preferences  Individual therapy.  Client Statement of Needs  Improve copings skills and understand herself better. Improve self esteem.  Symptoms  Depressed or irritable mood. Excessive and/or unrealistic worry that is difficult to control occurring more days than not for at least 6 months about a number of events or activities. Hypervigilance (e.g., feeling constantly on edge, experiencing concentration difficulties, having trouble falling or staying asleep, exhibiting a general state of irritability). Low self-esteem. Problems Addressed  Unipolar Depression, Anxiety Goals 1. Alleviate depressive symptoms and return to previous level of effective functioning. 2. Appropriately grieve the loss in order to normalize mood and to return to previously adaptive level of functioning. Objective Learn and implement behavioral strategies to overcome depression. Target Date: 2023-08-20 Frequency: Monthly  Progress: 60 Modality: individual  Related Interventions Assist the client in developing skills that increase the likelihood of deriving pleasure from behavioral activation (e.g., assertiveness skills, developing an exercise plan, less internal/more external focus, increased social involvement); reinforce success. Engage the client in "behavioral activation," increasing his/her activity level and contact with sources of reward, while identifying processes that inhibit activation. use behavioral techniques such as instruction, rehearsal, role-playing, role reversal, as needed, to facilitate activity in the client's daily life; reinforce success. 3. Develop healthy interpersonal relationships that lead to the alleviation and help prevent the relapse of depression. 4. Develop healthy thinking patterns and beliefs about self, others, and the world that lead to the alleviation and help prevent the relapse of depression. 5. Enhance ability to effectively cope with the  full variety of life's  worries and anxieties. 6. Learn and implement coping skills that result in a reduction of anxiety and worry, and improved daily functioning. Objective Learn and implement problem-solving strategies for realistically addressing worries. Target Date: 2023-08-20 Frequency: Monthly  Progress: 60 Modality: individual  Related Interventions Assign the client a homework exercise in which he/she problem-solves a current problem (see Mastery of Your Anxiety and Worry: Workbook by Elenora Fender and Filbert Schilder or Generalized Anxiety Disorder by Elesa Hacker, and Filbert Schilder); review, reinforce success, and provide corrective feedback toward improvement. Teach the client problem-solving strategies involving specifically defining a problem, generating options for addressing it, evaluating the pros and cons of each option, selecting and implementing an optional action, and reevaluating and refining the action. Objective Learn and implement calming skills to reduce overall anxiety and manage anxiety symptoms. Target Date: 2023-08-20 Frequency: Monthly  Progress: 60 Modality: individual  Related Interventions Assign the client to read about progressive muscle relaxation and other calming strategies in relevant books or treatment manuals (e.g., Progressive Relaxation Training by Twana First; Mastery of Your Anxiety and Worry: Workbook by Earlie Counts). Assign the client homework each session in which he/she practices relaxation exercises daily, gradually applying them progressively from non-anxiety-provoking to anxiety-provoking situations; review and reinforce success while providing corrective feedback toward improvement. Teach the client calming/relaxation skills (e.g., applied relaxation, progressive muscle relaxation, cue controlled relaxation; mindful breathing; biofeedback) and how to discriminate better between relaxation and tension; teach the client how to apply these skills to his/her daily life. 7.  Recognize, accept, and cope with feelings of depression. 8. Reduce overall frequency, intensity, and duration of the anxiety so that daily functioning is not impaired. 9. Resolve the core conflict that is the source of anxiety. 10. Stabilize anxiety level while increasing ability to function on a daily basis. Diagnosis F43.23 Conditions For Discharge Achievement of treatment goals and objectives   Salomon Fick, LCSW

## 2023-01-20 NOTE — Progress Notes (Signed)
Established Patient Office Visit  Subjective    Patient ID: KYRENE LONGAN, female    DOB: 01/25/1981  Age: 42 y.o. MRN: [id number]  CC:  No chief complaint on file.   HPI KAMILLA HANDS presents to for follow up on chronic medical conditions.  OSA: -Patient had sleep study 07/12/2022 showing moderate OSA -CPAP?  Weight Gain: -Had been working out and eating health in 2009, lost some weight but then started the Zoloft and gained 90 pounds  -Tried [facility], [employer] and other programs that have not been successful -Exercises some - takes yoga classes weekly  -Last A1c 7/23 5.3% -No longer on Wegovy due to supply issues had been up to 0.5 mg once weekly  -Now on Ozempic 0.25 mg through direct primary care - had one injection last week.  She states overall she did tolerate this well, however she did have a migraine the next day.  Migraine: -Following with Neurology last seen 08/26/2022 -Currently on Nurtec 1 week before and after cycle, Imitrex as needed. Propanolol 120 mg daily for ppx as well.  Seasonal Allergies: -Currently on Zyrtec daily for 5 years, symptoms well controlled.   GERD: -Currently on Prilosec 40 mg daily  -Symptoms well controlled  -EGD 03/21/21 showing 2 cm hiatal hernia   MDD: -Mood status: stable  -Current treatment: Wellbutrin 300 mg XL (started in 2017), Zoloft 100 mg (started in 2015) -Satisfied with current treatment?: yes -Duration of current treatment : years -Side effects: no Medication compliance: excellent compliance Psychotherapy/counseling: yes current     07/22/2022    8:07 AM 06/24/2022    8:26 AM 05/13/2022    3:06 PM 04/24/2022   11:41 AM 01/19/2021    3:21 PM  Depression screen PHQ 2/9  Decreased Interest 0 0 0 1 0  Down, Depressed, Hopeless 0 0 0 0 0  PHQ - 2 Score 0 0 0 1 0  Altered sleeping 1 0 0    Tired, decreased energy 1 0 0    Change in appetite 1 0 0    Feeling bad or failure about yourself  1 0 0    Trouble  concentrating 0 0 0    Moving slowly or fidgety/restless 0 0 0    Suicidal thoughts 0 0 0    PHQ-9 Score 4 0 0    Difficult doing work/chores Not difficult at all Not difficult at all Not difficult at all     Health Maintenance: -Blood work due -Mammogram 8/23 Birads-1 -Pap 6/21 negative   Outpatient Encounter Medications as of 01/21/2023  Medication Sig   acetaminophen (TYLENOL) 500 MG tablet Take 500 mg by mouth every 6 (six) hours as needed.   Ascorbic Acid (VITAMIN C) 1000 MG tablet Take 1,000 mg by mouth daily.   buPROPion (WELLBUTRIN XL) 300 MG 24 hr tablet TAKE 1 TABLET BY MOUTH DAILY   cetirizine (ZYRTEC ALLERGY) 10 MG tablet Take 1 tablet (10 mg total) by mouth daily.   clobetasol cream (TEMOVATE) 0.05 % Apply 1 application topically 2 (two) times daily.   diphenhydrAMINE (BENADRYL) 25 MG tablet Take 25 mg by mouth at bedtime as needed for sleep.   hydrocortisone 2.5 % cream Mix with ketoconazole 2 % and apply topically to affected areas bid. If improved use cream once daily to aa. If clear use Ketoconazole cream only to affected area.   ibuprofen (ADVIL,MOTRIN) 600 MG tablet Take 1 tablet (600 mg total) by mouth every 6 (six) hours as  needed for mild pain, moderate pain or cramping.   ketoconazole (NIZORAL) 2 % cream Mix with hydrocortisone 2.5 % cream and use to aa bid. If improved use once daily. If still clear use ketoconazole cream to aa only.   magnesium 30 MG tablet Take 30 mg by mouth 2 (two) times daily.   Melatonin-Pyridoxine (MELATIN PO) Take by mouth.   Multiple Vitamin (MULTIVITAMIN) tablet Take 1 tablet by mouth daily.   omeprazole (PRILOSEC) 40 MG capsule Take 1 capsule (40 mg total) by mouth daily.   ondansetron (ZOFRAN-ODT) 4 MG disintegrating tablet DISSOLVE 1 TABLET ON THE TONGUE  EVERY 8 HOURS AS NEEDED FOR  NAUSEA OR VOMITING   propranolol ER (INDERAL LA) 120 MG 24 hr capsule TAKE 1 CAPSULE BY MOUTH DAILY   Rimegepant Sulfate (NURTEC) 75 MG TBDP DISSOLVE 1  TABLET ON THE TONGUE  DAILY AS NEEDED   Semaglutide,0.25 or 0.5MG /DOS, (OZEMPIC, 0.25 OR 0.5 MG/DOSE,) 2 MG/3ML SOPN Inject 0.5 mg into the skin once a week.   Semaglutide-Weight Management (WEGOVY) 0.25 MG/0.5ML SOAJ Inject 0.25 mg into the skin once a week. (Patient not taking: Reported on 08/26/2022)   sertraline (ZOLOFT) 100 MG tablet TAKE 1 TABLET BY MOUTH  DAILY   sodium chloride (OCEAN) 0.65 % SOLN nasal spray Place 1 spray into both nostrils as needed for congestion.   SUMAtriptan (IMITREX) 100 MG tablet TAKE 1 TABLET BY MOUTH AS NEEDED FOR UP TO 1 DOSE FOR MIGRAINE .  MAY REPEAT IN 2 HOURS IF  HEADACHE PERSISTS OR RECURS. MAX 2 TABLETS IN 24 HOURS   Zinc-Vitamin C (ZINC-A-COLD/VITAMIN C MT) Use as directed in the mouth or throat.   No facility-administered encounter medications on file as of 01/21/2023.    Past Medical History:  Diagnosis Date   Allergic rhinitis    Anxiety    Chronic headaches    HORMONAL    Complication of anesthesia    Depression    Elevated blood pressure    Elevated blood pressure affecting pregnancy in third trimester, antepartum 10/16/2017   GERD (gastroesophageal reflux disease)    Gestational hypertension 11/15/2017   Migraines    PONV (postoperative nausea and vomiting)     Past Surgical History:  Procedure Laterality Date   DILATION AND CURETTAGE OF UTERUS     DILATION AND EVACUATION N/A 01/07/2017   Procedure: DILATATION AND EVACUATION;  Surgeon: Vena Austria, MD;  Location: [facility] ORS;  Service: Gynecology;  Laterality: N/A;   TONSILLECTOMY AND ADENOIDECTOMY  1994    Family History  Problem Relation Age of Onset   Hypertension Father    Heart disease Father    Hypertension Brother    Hypertension Brother        prediabetes   Breast cancer Maternal Aunt 48       Mothers aunt   Schizophrenia Maternal Uncle    Lymphoma Paternal Grandfather    Alcoholism Other    Lung cancer Other     Social History   Socioeconomic History    Marital status: Married    Spouse name: Not on file   Number of children: 1   Years of education: Not on file   Highest education level: Not on file  Occupational History   Not on file  Tobacco Use   Smoking status: Former    Packs/day: 0.25    Years: 16.00    Additional pack years: 0.00    Total pack years: 4.00    Types: Cigarettes  Quit date: 01/06/2014    Years since quitting: 9.0   Smokeless tobacco: Never  Vaping Use   Vaping Use: Never used  Substance and Sexual Activity   Alcohol use: Yes    Alcohol/week: 0.0 standard drinks of alcohol    Comment: rare   Drug use: No   Sexual activity: Yes    Birth control/protection: None  Other Topics Concern   Not on file  Social History Narrative   Not on file   Social Determinants of Health   Financial Resource Strain: Not on file  Food Insecurity: Not on file  Transportation Needs: Not on file  Physical Activity: Not on file  Stress: Not on file  Social Connections: Not on file  Intimate Partner Violence: Not on file    Review of Systems  Constitutional:  Negative for chills and fever.  Eyes:  Negative for blurred vision.  Respiratory:  Negative for shortness of breath.   Cardiovascular:  Negative for chest pain.  Gastrointestinal:  Negative for abdominal pain, constipation, diarrhea, heartburn, nausea and vomiting.  Neurological:  Positive for headaches.      Objective    There were no vitals taken for this visit.  Physical Exam Constitutional:      Appearance: Normal appearance. She is obese.  HENT:     Head: Normocephalic and atraumatic.  Eyes:     Conjunctiva/sclera: Conjunctivae normal.  Cardiovascular:     Rate and Rhythm: Normal rate and regular rhythm.  Pulmonary:     Effort: Pulmonary effort is normal.     Breath sounds: Normal breath sounds.  Skin:    General: Skin is warm and dry.  Neurological:     General: No focal deficit present.     Mental Status: She is alert. Mental status is at  baseline.  Psychiatric:        Mood and Affect: Mood normal.        Behavior: Behavior normal.       Assessment & Plan:   1. Class 3 severe obesity due to excess calories with body mass index (BMI) of 40.0 to 44.9 in adult, unspecified whether serious comorbidity present [facility]): Patient no longer on Wegovy due to supply issues, she is now getting Ozempic through direct primary care.  She is currently on the first dose but only had 1 injection so far.  She is planning on going back to that clinic tomorrow for her second injection.  As direct primary care is now taking over her weight loss treatment, I will no longer be prescribing the [facility].  She will continue to work on diet and exercise and will follow-up here in 6 months for her routine medical follow-up.  2. OSA (obstructive sleep apnea): Reviewed sleep study results, patient diagnosed with moderate OSA with plans to discuss treatment options with sleep medicine.   No follow-ups on file.  - plan for fasting labs at that time.   Margarita Mail, DO

## 2023-01-21 ENCOUNTER — Ambulatory Visit: Payer: Managed Care, Other (non HMO) | Admitting: Internal Medicine

## 2023-01-21 ENCOUNTER — Encounter: Payer: Self-pay | Admitting: Internal Medicine

## 2023-01-21 VITALS — BP 118/78 | HR 87 | Temp 97.6°F | Resp 18 | Ht 66.0 in | Wt 224.9 lb

## 2023-01-21 DIAGNOSIS — Z8349 Family history of other endocrine, nutritional and metabolic diseases: Secondary | ICD-10-CM

## 2023-01-21 DIAGNOSIS — G4733 Obstructive sleep apnea (adult) (pediatric): Secondary | ICD-10-CM | POA: Diagnosis not present

## 2023-01-21 DIAGNOSIS — F419 Anxiety disorder, unspecified: Secondary | ICD-10-CM

## 2023-01-21 DIAGNOSIS — Z1159 Encounter for screening for other viral diseases: Secondary | ICD-10-CM

## 2023-01-21 DIAGNOSIS — F32A Depression, unspecified: Secondary | ICD-10-CM

## 2023-01-21 DIAGNOSIS — Z13220 Encounter for screening for lipoid disorders: Secondary | ICD-10-CM | POA: Diagnosis not present

## 2023-01-21 DIAGNOSIS — Z6836 Body mass index (BMI) 36.0-36.9, adult: Secondary | ICD-10-CM

## 2023-01-21 MED ORDER — WEGOVY 2.4 MG/0.75ML ~~LOC~~ SOAJ
2.4000 mg | SUBCUTANEOUS | 2 refills | Status: DC
Start: 2023-01-21 — End: 2023-01-23

## 2023-01-21 MED ORDER — SERTRALINE HCL 100 MG PO TABS: 100.0000 mg | ORAL_TABLET | Freq: Every day | ORAL | 3 refills | Status: DC

## 2023-01-22 ENCOUNTER — Encounter: Payer: Self-pay | Admitting: Internal Medicine

## 2023-01-22 LAB — CBC WITH DIFFERENTIAL/PLATELET
Basophils Absolute: 0 10*3/uL (ref 0.0–0.2)
Basos: 1 %
EOS (ABSOLUTE): 0.1 10*3/uL (ref 0.0–0.4)
Eos: 2 %
Hematocrit: 39 % (ref 34.0–46.6)
Hemoglobin: 12.7 g/dL (ref 11.1–15.9)
Immature Grans (Abs): 0 10*3/uL (ref 0.0–0.1)
Immature Granulocytes: 0 %
Lymphocytes Absolute: 1.9 10*3/uL (ref 0.7–3.1)
Lymphs: 38 %
MCH: 30.5 pg (ref 26.6–33.0)
MCHC: 32.6 g/dL (ref 31.5–35.7)
MCV: 94 fL (ref 79–97)
Monocytes Absolute: 0.4 10*3/uL (ref 0.1–0.9)
Monocytes: 7 %
Neutrophils Absolute: 2.6 10*3/uL (ref 1.4–7.0)
Neutrophils: 52 %
Platelets: 171 10*3/uL (ref 150–450)
RBC: 4.17 x10E6/uL (ref 3.77–5.28)
RDW: 12.6 % (ref 11.7–15.4)
WBC: 4.9 10*3/uL (ref 3.4–10.8)

## 2023-01-22 LAB — HEMOGLOBIN A1C
Est. average glucose Bld gHb Est-mCnc: 97 mg/dL
Hgb A1c MFr Bld: 5 % (ref 4.8–5.6)

## 2023-01-22 LAB — LIPID PANEL
Chol/HDL Ratio: 3.5 ratio (ref 0.0–4.4)
Cholesterol, Total: 160 mg/dL (ref 100–199)
HDL: 46 mg/dL (ref >39)
LDL Chol Calc (NIH): 86 mg/dL (ref 0–99)
Triglycerides: 164 mg/dL — ABNORMAL HIGH (ref 0–149)
VLDL Cholesterol Cal: 28 mg/dL (ref 5–40)

## 2023-01-22 LAB — HEPATITIS C ANTIBODY: Hep C Virus Ab: NONREACTIVE

## 2023-01-23 ENCOUNTER — Other Ambulatory Visit: Payer: Self-pay | Admitting: Internal Medicine

## 2023-01-23 DIAGNOSIS — E669 Obesity, unspecified: Secondary | ICD-10-CM

## 2023-01-23 MED ORDER — WEGOVY 0.5 MG/0.5ML ~~LOC~~ SOAJ
0.5000 mg | SUBCUTANEOUS | 0 refills | Status: DC
Start: 2023-01-23 — End: 2023-10-17

## 2023-01-24 ENCOUNTER — Other Ambulatory Visit: Payer: Self-pay | Admitting: Internal Medicine

## 2023-01-24 NOTE — Telephone Encounter (Unsigned)
Copied from CRM [id number]. Topic: General - Other >> Jan 23, 2023  4:14 PM Turkey B wrote: Reason for CRM: [place] from [facility] [employer] [facility], calling about if pt has lost 5% of her base line weight, in order to get [facility]

## 2023-01-26 ENCOUNTER — Encounter: Payer: Self-pay | Admitting: Internal Medicine

## 2023-01-27 ENCOUNTER — Other Ambulatory Visit: Payer: Self-pay | Admitting: Internal Medicine

## 2023-01-27 DIAGNOSIS — Z8349 Family history of other endocrine, nutritional and metabolic diseases: Secondary | ICD-10-CM

## 2023-01-27 DIAGNOSIS — G4733 Obstructive sleep apnea (adult) (pediatric): Secondary | ICD-10-CM

## 2023-01-29 LAB — COMPREHENSIVE METABOLIC PANEL
ALT: 13 IU/L (ref 0–32)
AST: 18 IU/L (ref 0–40)
Albumin/Globulin Ratio: 2 (ref 1.2–2.2)
Albumin: 4.2 g/dL (ref 3.9–4.9)
Alkaline Phosphatase: 67 IU/L (ref 44–121)
BUN/Creatinine Ratio: 11 (ref 9–23)
BUN: 9 mg/dL (ref 6–24)
Bilirubin Total: 0.2 mg/dL (ref 0.0–1.2)
CO2: 23 mmol/L (ref 20–29)
Calcium: 9.1 mg/dL (ref 8.7–10.2)
Chloride: 105 mmol/L (ref 96–106)
Creatinine, Ser: 0.79 mg/dL (ref 0.57–1.00)
Globulin, Total: 2.1 g/dL (ref 1.5–4.5)
Glucose: 77 mg/dL (ref 70–99)
Potassium: 4.3 mmol/L (ref 3.5–5.2)
Sodium: 140 mmol/L (ref 134–144)
Total Protein: 6.3 g/dL (ref 6.0–8.5)
eGFR: 96 mL/min/{1.73_m2} (ref >59)

## 2023-01-29 LAB — ALPHA-1-ANTITRYPSIN: A-1 Antitrypsin: 143 mg/dL (ref 101–187)

## 2023-02-04 ENCOUNTER — Ambulatory Visit (INDEPENDENT_AMBULATORY_CARE_PROVIDER_SITE_OTHER): Payer: 59 | Admitting: Psychology

## 2023-02-04 DIAGNOSIS — F4323 Adjustment disorder with mixed anxiety and depressed mood: Secondary | ICD-10-CM

## 2023-02-04 NOTE — Progress Notes (Signed)
[facility] Counselor/Therapist Progress Note  Patient ID: Melinda Cortez, MRN: [id number],    Date: 02/04/2023  Time Spent: 11:00am-11:55am     55 minutes   Treatment Type: Individual Therapy  Reported Symptoms: stress, worry  Mental Status Exam: Appearance:  Casual     Behavior: Appropriate  Motor: Normal  Speech/Language:  Normal Rate  Affect: Appropriate  Mood: normal  Thought process: normal  Thought content:   WNL  Sensory/Perceptual disturbances:   WNL  Orientation: oriented to person, place, time/date, and situation  Attention: Good  Concentration: Good  Memory: WNL  Fund of knowledge:  Good  Insight:   Good  Judgment:  Good  Impulse Control: Good   Risk Assessment: Danger to Self:  No Self-injurious Behavior: No Danger to Others: No Duty to Warn:no Physical Aggression / Violence:No  Access to Firearms a concern: No  Gang Involvement:No   Subjective: Pt present for face-to-face individual therapy via video.  Pt consents to telehealth video session due to COVID 19 pandemic. Location of pt: home Location of therapist: home office.   Pt talked about her brother Melinda Cortez.   Melinda Cortez is still in rehab in [place].  Melinda Cortez seems to be invested in treatment and doing well.  This is a big relief for pt.  Encouraged pt to stay connected with [facility]. Pt talked about her daughter who has been engaging into self harm through scratching herself using her finger nail.   Her daughter has been having anxiety and self esteem issues.   Addressed pt's concerns about her daughter.   Pt got her daughter into counseling.   Worked on self care strategies.   Provided supportive therapy.     Interventions: Cognitive Behavioral Therapy and Insight-Oriented  Diagnosis: F43.23  Plan of Care: Treatment Plan (target date: 08/20/2023) Client Abilities/Strengths  Pt is bright, engaging, and motivated for therapy.  Client Treatment Preferences  Individual therapy.  Client  Statement of Needs  Improve copings skills and understand herself better. Improve self esteem.  Symptoms  Depressed or irritable mood. Excessive and/or unrealistic worry that is difficult to control occurring more days than not for at least 6 months about a number of events or activities. Hypervigilance (e.g., feeling constantly on edge, experiencing concentration difficulties, having trouble falling or staying asleep, exhibiting a general state of irritability). Low self-esteem. Problems Addressed  Unipolar Depression, Anxiety Goals 1. Alleviate depressive symptoms and return to previous level of effective functioning. 2. Appropriately grieve the loss in order to normalize mood and to return to previously adaptive level of functioning. Objective Learn and implement behavioral strategies to overcome depression. Target Date: 2023-08-20 Frequency: Monthly  Progress: 60 Modality: individual  Related Interventions Assist the client in developing skills that increase the likelihood of deriving pleasure from behavioral activation (e.g., assertiveness skills, developing an exercise plan, less internal/more external focus, increased social involvement); reinforce success. Engage the client in "behavioral activation," increasing his/her activity level and contact with sources of reward, while identifying processes that inhibit activation. use behavioral techniques such as instruction, rehearsal, role-playing, role reversal, as needed, to facilitate activity in the client's daily life; reinforce success. 3. Develop healthy interpersonal relationships that lead to the alleviation and help prevent the relapse of depression. 4. Develop healthy thinking patterns and beliefs about self, others, and the world that lead to the alleviation and help prevent the relapse of depression. 5. Enhance ability to effectively cope with the full variety of life's worries and anxieties. 6. Learn and implement coping skills  that result in a reduction of anxiety and worry, and improved daily functioning. Objective Learn and implement problem-solving strategies for realistically addressing worries. Target Date: 2023-08-20 Frequency: Monthly  Progress: 60 Modality: individual  Related Interventions Assign the client a homework exercise in which he/she problem-solves a current problem (see Mastery of Your Anxiety and Worry: Workbook by Elenora Fender and Filbert Schilder or Generalized Anxiety Disorder by Elesa Hacker, and Filbert Schilder); review, reinforce success, and provide corrective feedback toward improvement. Teach the client problem-solving strategies involving specifically defining a problem, generating options for addressing it, evaluating the pros and cons of each option, selecting and implementing an optional action, and reevaluating and refining the action. Objective Learn and implement calming skills to reduce overall anxiety and manage anxiety symptoms. Target Date: 2023-08-20 Frequency: Monthly  Progress: 60 Modality: individual  Related Interventions Assign the client to read about progressive muscle relaxation and other calming strategies in relevant books or treatment manuals (e.g., Progressive Relaxation Training by Twana First; Mastery of Your Anxiety and Worry: Workbook by Earlie Counts). Assign the client homework each session in which he/she practices relaxation exercises daily, gradually applying them progressively from non-anxiety-provoking to anxiety-provoking situations; review and reinforce success while providing corrective feedback toward improvement. Teach the client calming/relaxation skills (e.g., applied relaxation, progressive muscle relaxation, cue controlled relaxation; mindful breathing; biofeedback) and how to discriminate better between relaxation and tension; teach the client how to apply these skills to his/her daily life. 7. Recognize, accept, and cope with feelings of depression. 8.  Reduce overall frequency, intensity, and duration of the anxiety so that daily functioning is not impaired. 9. Resolve the core conflict that is the source of anxiety. 10. Stabilize anxiety level while increasing ability to function on a daily basis. Diagnosis F43.23 Conditions For Discharge Achievement of treatment goals and objectives   Salomon Fick, LCSW

## 2023-02-24 NOTE — Progress Notes (Unsigned)
NEUROLOGY FOLLOW UP OFFICE NOTE  Melinda Cortez [id number]  Assessment/Plan:   Menstrually related migraine, without status migrainosus, not intractable       Migraine prevention:  propranolol ER to 120mg  daily.  Migraine rescue:  sumatriptan 100mg .  Zofran ODT 4mg   For perimenstrual prophylaxis, continue Nurtec 75mg  daily for 14 days beginning first day of period (refilled).  Limit use of pain relievers to no more than 2 days out of week to prevent risk of rebound or medication-overuse headache. Keep headache diary Follow up 9 months     Subjective:  Melinda Cortez is a 42 year old female with depression and anxiety who follows up for headache and bilateral leg pain/weakness.   UPDATE: Migraine: Intensity:  mild to severe Duration:  an hour.  50% of time needs to repeat dose.   Frequency:  3-4 a month  However, she wasn't able to get Nurtec for the month of May.  She had 9 headache days in May.        Perimenstrual prophylaxis:  take Nurtec 75mg  daily for 14 days beginning first day of period.  Current NSAIDS/analgesics:  Tylenol Current triptans:  sumatriptan 100mg  Current ergotamine:  none Current anti-emetic:  Zofran ODT 4mg  Current muscle relaxants:  none Current Antihypertensive medications:  propranolol ER 120mg  daily Current Antidepressant medications:  sertraline 100mg  QD, Wellbutrin XL 300mg  QD Current Anticonvulsant medications:  none Current anti-CGRP:  Nurtec (perimenstrual prophylaxis) Current Vitamins/Herbal/Supplements:  none Current Antihistamines/Decongestants:  Zyrtec, Benadryl Other therapy:  heating pad Hormone/birth control:  none       Caffeine:  Diet soda on weekends.  Occasional coffee. Alcohol:  on occasion Smoker:  no Diet:  close to 64 oz water daily.  Skips breakfast Exercise:  yes Depression:  yes; Anxiety:  yes - treated Other pain: knee pain Sleep:  Started APAP for OSA.     HISTORY:  Onset:  late 48s - early 30s, worse  over past 2 years. Location:  left retro-orbital Quality:  pressure/stabbing Intensity:  Mild and severe.   Aura:  absent Prodrome:  absent Associated symptoms:  Nausea, vomiting, photophobia, phonophobia, osmophobia, feels like she may not have "full vision" but no blurred vision.  She denies associated  unilateral numbness or weakness. Duration:  severe pain lasts 1 day with lingering mild headache for 6 days - used to occur during week of menstrual period but more recently occurring week after or 2 weeks after Frequency:  once a month Frequency of abortive medication: 7 days a month Triggers:  hormonal Relieving factors:  heating pad on head, sleep in dark room Activity:  movement/activity aggravates She was seen in the ED in May 2022 for intractable migraine.  Due to nausea, wasn't able to keep her Relpax down.  Received headache cocktail which helped.   She had her son in 2019.  She received an epidural.  Since then, she has had intermittent shooting pains down both legs, typically anterior thigh.  She does report some low back pain.  No leg weakness or numbness.  No bowel or bladder dysfunction.  For low back and bilateral leg pain, she was referred to physical therapy.  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  She was referred to neurosurgery.  They stated there wasn't anything surgical but she  may respond to an injection.  However, she exhibited hyperreflexes in the upper as well as lower extremities.   MRI of C-spine on 10/23/2021 showed mild spinal stenosis at C3-4 and C5-6 with moderate right foraminal narrowing and small central disc protrusion at C6-7 without significant stenosis but no compressive or non-compressive myelopathy.  MRI of T-spine revealed central disc protrusion at  T11-2 and small left paracentral disc protrusion at T10-11 both causing mild cord flattening but without cord signal changes.       Past NSAIDS/analgesics:  ibuprofen, meloxicam Past abortive triptans:  eletriptan, rizatriptan Past abortive ergotamine:  none Past muscle relaxants:  none Past anti-emetic:  Reglan, Compazine, promethazine Past antihypertensive medications:  labetolol Past antidepressant medications:  none Past anticonvulsant medications:  none Past anti-CGRP:  none Past vitamins/Herbal/Supplements:  none Past antihistamines/decongestants:  hydroxyzine Other past therapies:  prednisone     Family history of headache:  brother, father, paternal grandmother.  Reports that her brother was diagnosed with "brain atrophy".  He is an alcoholic.    PAST MEDICAL HISTORY: Past Medical History:  Diagnosis Date   Allergic rhinitis    Anxiety    Chronic headaches    HORMONAL    Complication of anesthesia    Depression    Elevated blood pressure    Elevated blood pressure affecting pregnancy in third trimester, antepartum 10/16/2017   GERD (gastroesophageal reflux disease)    Gestational hypertension 11/15/2017   Migraines    PONV (postoperative nausea and vomiting)     MEDICATIONS: Current Outpatient Medications on File Prior to Visit  Medication Sig Dispense Refill   acetaminophen (TYLENOL) 500 MG tablet Take 500 mg by mouth every 6 (six) hours as needed.     Ascorbic Acid (VITAMIN C) 1000 MG tablet Take 1,000 mg by mouth daily.     buPROPion (WELLBUTRIN XL) 300 MG 24 hr tablet TAKE 1 TABLET BY MOUTH DAILY 90 tablet 3   cetirizine (ZYRTEC ALLERGY) 10 MG tablet Take 1 tablet (10 mg total) by mouth daily. 30 tablet 0   clobetasol cream (TEMOVATE) 0.05 % Apply 1 application topically 2 (two) times daily. 60 each 1   diphenhydrAMINE (BENADRYL) 25 MG tablet Take 25 mg by mouth at bedtime as needed for sleep.     hydrocortisone 2.5 % cream Mix with ketoconazole 2 % and apply  topically to affected areas bid. If improved use cream once daily to aa. If clear use Ketoconazole cream only to affected area. 28 g 1   ibuprofen (ADVIL,MOTRIN) 600 MG tablet Take 1 tablet (600 mg total) by mouth every 6 (six) hours as needed for mild pain, moderate pain or cramping. 30 tablet 0   ketoconazole (NIZORAL) 2 % cream Mix with hydrocortisone 2.5 % cream and use to aa bid. If improved use once daily. If still clear use ketoconazole cream to aa only. 30 g 3   magnesium 30 MG tablet Take 30 mg by mouth 2 (two) times daily.     Melatonin-Pyridoxine (MELATIN PO) Take by mouth.     Multiple Vitamin (MULTIVITAMIN) tablet Take 1 tablet by mouth daily.     omeprazole (PRILOSEC) 40 MG capsule Take 1 capsule (40 mg total) by mouth daily. 90 capsule 3   ondansetron (ZOFRAN-ODT) 4 MG disintegrating tablet DISSOLVE 1 TABLET ON THE TONGUE  EVERY 8 HOURS AS NEEDED FOR  NAUSEA OR VOMITING 30 tablet 5   propranolol ER (INDERAL LA) 120 MG 24 hr capsule TAKE 1 CAPSULE BY MOUTH DAILY 90  capsule 3   Rimegepant Sulfate (NURTEC) 75 MG TBDP DISSOLVE 1 TABLET ON THE TONGUE  DAILY AS NEEDED 48 tablet 1   Semaglutide-Weight Management (WEGOVY) 0.5 MG/0.5ML SOAJ Inject 0.5 mg into the skin once a week. 2 mL 0   sertraline (ZOLOFT) 100 MG tablet Take 1 tablet (100 mg total) by mouth daily. 90 tablet 3   sodium chloride (OCEAN) 0.65 % SOLN nasal spray Place 1 spray into both nostrils as needed for congestion.     SUMAtriptan (IMITREX) 100 MG tablet TAKE 1 TABLET BY MOUTH AS NEEDED FOR UP TO 1 DOSE FOR MIGRAINE .  MAY REPEAT IN 2 HOURS IF  HEADACHE PERSISTS OR RECURS. MAX 2 TABLETS IN 24 HOURS 10 tablet 5   Zinc-Vitamin C (ZINC-A-COLD/VITAMIN C MT) Use as directed in the mouth or throat.     No current facility-administered medications on file prior to visit.    ALLERGIES: No Known Allergies  FAMILY HISTORY: Family History  Problem Relation Age of Onset   Hypertension Father    Heart disease Father     Hypertension Brother    Hypertension Brother        prediabetes   Breast cancer Maternal Aunt 27       Mothers aunt   Schizophrenia Maternal Uncle    Lymphoma Paternal Grandfather    Alcoholism Other    Lung cancer Other       Objective:  Blood pressure 138/71, pulse 81, height 5\' 6"  (1.676 m), weight 224 lb 3.2 oz (101.7 kg), SpO2 98 %. General: No acute distress.  Patient appears well-groomed.   Head:  Normocephalic/atraumatic Eyes:  Fundi examined but not visualized Neck: supple, no paraspinal tenderness, full range of motion Heart:  Regular rate and rhythm Lungs:  Clear to auscultation bilaterally Back: No paraspinal tenderness Neurological Exam: alert and oriented.  Speech fluent and not dysarthric, language intact.  CN II-XII intact. Bulk and tone normal, muscle strength 5/5 throughout.  Sensation to light touch intact.  Deep tendon reflexes 2+ throughout, toes downgoing.  Finger to nose testing intact.  Gait normal, Romberg negative.   Shon Millet, DO  CC: Margarita Mail, DO

## 2023-02-25 ENCOUNTER — Encounter: Payer: Self-pay | Admitting: Neurology

## 2023-02-25 ENCOUNTER — Ambulatory Visit: Payer: Managed Care, Other (non HMO) | Admitting: Neurology

## 2023-02-25 VITALS — BP 138/71 | HR 81 | Ht 66.0 in | Wt 224.2 lb

## 2023-02-25 DIAGNOSIS — G43829 Menstrual migraine, not intractable, without status migrainosus: Secondary | ICD-10-CM

## 2023-02-25 MED ORDER — SUMATRIPTAN SUCCINATE 100 MG PO TABS: ORAL_TABLET | ORAL | 3 refills | Status: DC

## 2023-02-25 MED ORDER — NURTEC 75 MG PO TBDP: ORAL_TABLET | ORAL | 3 refills | Status: DC

## 2023-02-25 MED ORDER — PROPRANOLOL HCL ER 120 MG PO CP24: 120.0000 mg | ORAL_CAPSULE | Freq: Every day | ORAL | 3 refills | Status: DC

## 2023-02-25 NOTE — Progress Notes (Signed)
Medication Samples have been provided to the patient.  Drug name: Nurtec       Strength: 75 mg        Qty: 8  LOT: [id number]  Exp.Date: 3/26  Dosing instructions: as needed  The patient has been instructed regarding the correct time, dose, and frequency of taking this medication, including desired effects and most common side effects.   Leida Lauth 3:01 PM 02/25/2023

## 2023-02-25 NOTE — Patient Instructions (Signed)
Propranolol ER 120mg  daily Sumatriptan with Zofran as needed.  Limit use of pain relievers to no more than 2 days out of week to prevent risk of rebound or medication-overuse headache. Nurtec daily for 14 days beginning first day of period Keep headache diary Follow up 9 months.

## 2023-03-04 ENCOUNTER — Encounter: Payer: Self-pay | Admitting: Neurology

## 2023-03-04 ENCOUNTER — Telehealth: Payer: Self-pay

## 2023-03-04 NOTE — Telephone Encounter (Signed)
Per patient, I received a message from [facility] saying they need info from the doctor's office before they can fill my Nurtec Rx. They said to ask the office to call them to discuss. So, can you please reach out to them to see what information they need from your office? Thank you so much!

## 2023-03-06 ENCOUNTER — Telehealth: Payer: Self-pay

## 2023-03-06 NOTE — Telephone Encounter (Signed)
PA has been APPROVED from 03/06/2023-03/05/2024

## 2023-03-06 NOTE — Telephone Encounter (Signed)
*  LBN   PA request received for Nurtec 75MG  dispersible tablets  PA submitted to [facility] via CMM and is pending additional questions/determination  Key: [id number]

## 2023-03-06 NOTE — Telephone Encounter (Signed)
PA for Nurtec submitted. Will be updated in additional encounter created.

## 2023-03-12 ENCOUNTER — Other Ambulatory Visit: Payer: Self-pay

## 2023-03-12 ENCOUNTER — Other Ambulatory Visit: Payer: Self-pay | Admitting: Internal Medicine

## 2023-03-12 DIAGNOSIS — K219 Gastro-esophageal reflux disease without esophagitis: Secondary | ICD-10-CM

## 2023-03-12 MED ORDER — NURTEC 75 MG PO TBDP: ORAL_TABLET | ORAL | 3 refills | Status: DC

## 2023-03-13 NOTE — Telephone Encounter (Signed)
Requested Prescriptions  Pending Prescriptions Disp Refills   omeprazole (PRILOSEC) 40 MG capsule [Pharmacy Med Name: Omeprazole 40 MG Oral Capsule Delayed Release] 90 capsule 3    Sig: TAKE 1 CAPSULE BY MOUTH DAILY     Gastroenterology: Proton Pump Inhibitors Passed - 03/12/2023 10:02 PM      Passed - Valid encounter within last 12 months    Recent Outpatient Visits           1 month ago Class 2 severe obesity with serious comorbidity and body mass index (BMI) of 36.0 to 36.9 in adult, unspecified obesity type [facility])   [facility] Margarita Mail, DO   7 months ago Class 3 severe obesity due to excess calories with body mass index (BMI) of 40.0 to 44.9 in adult, unspecified whether serious comorbidity present [facility])   [facility] Margarita Mail, DO   8 months ago Class 3 severe obesity due to excess calories with body mass index (BMI) of 40.0 to 44.9 in adult, unspecified whether serious comorbidity present [facility])   [facility] Margarita Mail, DO   9 months ago [place]   [facility] Margarita Mail, DO   10 months ago Class 3 severe obesity due to excess calories with body mass index (BMI) of 40.0 to 44.9 in adult, unspecified whether serious comorbidity present [facility])   [facility] Margarita Mail, [place]

## 2023-04-21 ENCOUNTER — Ambulatory Visit (INDEPENDENT_AMBULATORY_CARE_PROVIDER_SITE_OTHER): Payer: 59 | Admitting: Psychology

## 2023-04-21 DIAGNOSIS — F4323 Adjustment disorder with mixed anxiety and depressed mood: Secondary | ICD-10-CM | POA: Diagnosis not present

## 2023-04-21 NOTE — Progress Notes (Signed)
[facility] Counselor/Therapist Progress Note  Patient ID: ABRIAH LIAKOS, MRN: [id number],    Date: 04/21/2023  Time Spent: 11:00am-11:55am     55 minutes   Treatment Type: Individual Therapy  Reported Symptoms: stress, worry  Mental Status Exam: Appearance:  Casual     Behavior: Appropriate  Motor: Normal  Speech/Language:  Normal Rate  Affect: Appropriate  Mood: normal  Thought process: normal  Thought content:   WNL  Sensory/Perceptual disturbances:   WNL  Orientation: oriented to person, place, time/date, and situation  Attention: Good  Concentration: Good  Memory: WNL  Fund of knowledge:  Good  Insight:   Good  Judgment:  Good  Impulse Control: Good   Risk Assessment: Danger to Self:  No Self-injurious Behavior: No Danger to Others: No Duty to Warn:no Physical Aggression / Violence:No  Access to Firearms a concern: No  Gang Involvement:No   Subjective: Pt present for face-to-face individual therapy via video.  Pt consents to telehealth video session and is aware of limitations of virtual sessions. Location of pt: home Location of therapist: home office.   Pt talked about her brother [name].   He is out of rehab and back in .   He has been angry and had relapsed within 24 hours of being out of rehab.  His children pleaded with him to return to rehab and he did.  He is now still in [place] and doing intensive outpatient program.   Pt talked to [place] last week and he seems to be sober and doing much better.  Addressed how the issues with her brother impact pt.  Helped pt process her feelings nad relationship dynamics.  Encouraged pt to stay connected with [facility]. Pt talked about her daughter who  has been having anxiety and self esteem issues.   Addressed pt's concerns about her daughter.     Worked on self care strategies.   Provided supportive therapy.     Interventions: Cognitive Behavioral Therapy and Insight-Oriented  Diagnosis:  F43.23  Plan of Care: Treatment Plan (target date: 08/20/2023) Client Abilities/Strengths  Pt is bright, engaging, and motivated for therapy.  Client Treatment Preferences  Individual therapy.  Client Statement of Needs  Improve copings skills and understand herself better. Improve self esteem.  Symptoms  Depressed or irritable mood. Excessive and/or unrealistic worry that is difficult to control occurring more days than not for at least 6 months about a number of events or activities. Hypervigilance (e.g., feeling constantly on edge, experiencing concentration difficulties, having trouble falling or staying asleep, exhibiting a general state of irritability). Low self-esteem. Problems Addressed  Unipolar Depression, Anxiety Goals 1. Alleviate depressive symptoms and return to previous level of effective functioning. 2. Appropriately grieve the loss in order to normalize mood and to return to previously adaptive level of functioning. Objective Learn and implement behavioral strategies to overcome depression. Target Date: 2023-08-20 Frequency: Monthly  Progress: 60 Modality: individual  Related Interventions Assist the client in developing skills that increase the likelihood of deriving pleasure from behavioral activation (e.g., assertiveness skills, developing an exercise plan, less internal/more external focus, increased social involvement); reinforce success. Engage the client in "behavioral activation," increasing his/her activity level and contact with sources of reward, while identifying processes that inhibit activation. use behavioral techniques such as instruction, rehearsal, role-playing, role reversal, as needed, to facilitate activity in the client's daily life; reinforce success. 3. Develop healthy interpersonal relationships that lead to the alleviation and help prevent the relapse of depression. 4. Develop healthy  thinking patterns and beliefs about self, others, and the world  that lead to the alleviation and help prevent the relapse of depression. 5. Enhance ability to effectively cope with the full variety of life's worries and anxieties. 6. Learn and implement coping skills that result in a reduction of anxiety and worry, and improved daily functioning. Objective Learn and implement problem-solving strategies for realistically addressing worries. Target Date: 2023-08-20 Frequency: Monthly  Progress: 60 Modality: individual  Related Interventions Assign the client a homework exercise in which he/she problem-solves a current problem (see Mastery of Your Anxiety and Worry: Workbook by Elenora Fender and Filbert Schilder or Generalized Anxiety Disorder by Elesa Hacker, and Filbert Schilder); review, reinforce success, and provide corrective feedback toward improvement. Teach the client problem-solving strategies involving specifically defining a problem, generating options for addressing it, evaluating the pros and cons of each option, selecting and implementing an optional action, and reevaluating and refining the action. Objective Learn and implement calming skills to reduce overall anxiety and manage anxiety symptoms. Target Date: 2023-08-20 Frequency: Monthly  Progress: 60 Modality: individual  Related Interventions Assign the client to read about progressive muscle relaxation and other calming strategies in relevant books or treatment manuals (e.g., Progressive Relaxation Training by Twana First; Mastery of Your Anxiety and Worry: Workbook by Earlie Counts). Assign the client homework each session in which he/she practices relaxation exercises daily, gradually applying them progressively from non-anxiety-provoking to anxiety-provoking situations; review and reinforce success while providing corrective feedback toward improvement. Teach the client calming/relaxation skills (e.g., applied relaxation, progressive muscle relaxation, cue controlled relaxation; mindful breathing;  biofeedback) and how to discriminate better between relaxation and tension; teach the client how to apply these skills to his/her daily life. 7. Recognize, accept, and cope with feelings of depression. 8. Reduce overall frequency, intensity, and duration of the anxiety so that daily functioning is not impaired. 9. Resolve the core conflict that is the source of anxiety. 10. Stabilize anxiety level while increasing ability to function on a daily basis. Diagnosis F43.23 Conditions For Discharge Achievement of treatment goals and objectives   Salomon Fick, LCSW

## 2023-04-26 ENCOUNTER — Other Ambulatory Visit: Payer: Self-pay | Admitting: Family Medicine

## 2023-06-02 ENCOUNTER — Other Ambulatory Visit: Payer: Self-pay | Admitting: Internal Medicine

## 2023-06-02 DIAGNOSIS — Z1231 Encounter for screening mammogram for malignant neoplasm of breast: Secondary | ICD-10-CM

## 2023-06-11 ENCOUNTER — Ambulatory Visit
Admission: RE | Admit: 2023-06-11 | Discharge: 2023-06-11 | Disposition: A | Discharge status: Home / Self Care | Payer: Managed Care, Other (non HMO) | Source: Ambulatory Visit | Attending: Internal Medicine | Admitting: Internal Medicine

## 2023-06-11 DIAGNOSIS — Z1231 Encounter for screening mammogram for malignant neoplasm of breast: Secondary | ICD-10-CM | POA: Insufficient documentation

## 2023-06-19 ENCOUNTER — Other Ambulatory Visit (HOSPITAL_COMMUNITY): Payer: Self-pay

## 2023-07-06 ENCOUNTER — Encounter: Payer: Self-pay | Admitting: Internal Medicine

## 2023-07-07 ENCOUNTER — Ambulatory Visit (INDEPENDENT_AMBULATORY_CARE_PROVIDER_SITE_OTHER): Payer: 59 | Admitting: Psychology

## 2023-07-07 ENCOUNTER — Other Ambulatory Visit: Payer: Self-pay | Admitting: Internal Medicine

## 2023-07-07 DIAGNOSIS — K219 Gastro-esophageal reflux disease without esophagitis: Secondary | ICD-10-CM

## 2023-07-07 DIAGNOSIS — F4323 Adjustment disorder with mixed anxiety and depressed mood: Secondary | ICD-10-CM | POA: Diagnosis not present

## 2023-07-07 MED ORDER — OMEPRAZOLE 40 MG PO CPDR: 40.0000 mg | DELAYED_RELEASE_CAPSULE | Freq: Every day | ORAL | 3 refills | Status: DC

## 2023-07-07 NOTE — Progress Notes (Signed)
[facility] Counselor/Therapist Progress Note  Patient ID: Melinda Cortez, MRN: [id number],    Date: 07/07/2023  Time Spent: 11:00am-11:55am     55 minutes   Treatment Type: Individual Therapy  Reported Symptoms: stress, worry  Mental Status Exam: Appearance:  Casual     Behavior: Appropriate  Motor: Normal  Speech/Language:  Normal Rate  Affect: Appropriate  Mood: normal  Thought process: normal  Thought content:   WNL  Sensory/Perceptual disturbances:   WNL  Orientation: oriented to person, place, time/date, and situation  Attention: Good  Concentration: Good  Memory: WNL  Fund of knowledge:  Good  Insight:   Good  Judgment:  Good  Impulse Control: Good   Risk Assessment: Danger to Self:  No Self-injurious Behavior: No Danger to Others: No Duty to Warn:no Physical Aggression / Violence:No  Access to Firearms a concern: No  Gang Involvement:No   Subjective: Pt present for face-to-face individual therapy via video.  Pt consents to telehealth video session and is aware of limitations and benefits of virtual sessions. Location of pt: home Location of therapist: home office.   Pt talked about her brother [name].    He is still in [place] doing outpt substance abuse treatment and living in a halfway house.  He got a job at [employer] and seems to be doing better.  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  Pt's brother has over 100 days sober.   Addressed how the issues with her brother impact pt.  Helped pt process her feelings nad relationship dynamics.  Encouraged pt to stay connected with [place]. Pt talked about her 64 yo daughter.  Pt's daughter had thoughts of self harm (cutting) a few weeks ago and asked to go to counseling.   Pt recently found a counseling resource for her daughter.    Pt was tearful as she talked about her worries about her daughter.   Addressed pt's concerns about her daughter.   Pt talked about her 40 yo son who is  having some anxiety.  Worked on parenting strategies.  Worked on self care strategies.   Provided supportive therapy.     Interventions: Cognitive Behavioral Therapy and Insight-Oriented  Diagnosis: F43.23  Plan of Care: Treatment Plan (target date: 08/20/2023) Client Abilities/Strengths  Pt is bright, engaging, and motivated for therapy.  Client Treatment Preferences  Individual therapy.  Client Statement of Needs  Improve copings skills and understand herself better. Improve self esteem.  Symptoms  Depressed or irritable mood. Excessive and/or unrealistic worry that is difficult to control occurring more days than not for at least 6 months about a number of events or activities. Hypervigilance (e.g., feeling constantly on edge, experiencing concentration difficulties, having trouble falling or staying asleep, exhibiting a general state of irritability). Low self-esteem. Problems Addressed  Unipolar Depression, Anxiety Goals 1. Alleviate depressive symptoms and return to previous level of effective functioning. 2. Appropriately grieve the loss in order to normalize mood and to return to previously adaptive level of functioning. Objective Learn and implement behavioral strategies to overcome depression. Target Date: 2023-08-20 Frequency: Monthly  Progress: 60 Modality: individual  Related Interventions Assist the client in developing skills that increase the likelihood of deriving pleasure from behavioral activation (e.g., assertiveness skills, developing an exercise plan, less internal/more external focus, increased social involvement); reinforce success. Engage the client in "behavioral activation," increasing his/her activity level and contact with sources of reward, while identifying processes that inhibit activation. use behavioral techniques such as instruction, rehearsal,  role-playing, role reversal, as needed, to facilitate activity in the client's daily life; reinforce  success. 3. Develop healthy interpersonal relationships that lead to the alleviation and help prevent the relapse of depression. 4. Develop healthy thinking patterns and beliefs about self, others, and the world that lead to the alleviation and help prevent the relapse of depression. 5. Enhance ability to effectively cope with the full variety of life's worries and anxieties. 6. Learn and implement coping skills that result in a reduction of anxiety and worry, and improved daily functioning. Objective Learn and implement problem-solving strategies for realistically addressing worries. Target Date: 2023-08-20 Frequency: Monthly  Progress: 60 Modality: individual  Related Interventions Assign the client a homework exercise in which he/she problem-solves a current problem (see Mastery of Your Anxiety and Worry: Workbook by Elenora Fender and Filbert Schilder or Generalized Anxiety Disorder by Elesa Hacker, and Filbert Schilder); review, reinforce success, and provide corrective feedback toward improvement. Teach the client problem-solving strategies involving specifically defining a problem, generating options for addressing it, evaluating the pros and cons of each option, selecting and implementing an optional action, and reevaluating and refining the action. Objective Learn and implement calming skills to reduce overall anxiety and manage anxiety symptoms. Target Date: 2023-08-20 Frequency: Monthly  Progress: 60 Modality: individual  Related Interventions Assign the client to read about progressive muscle relaxation and other calming strategies in relevant books or treatment manuals (e.g., Progressive Relaxation Training by Twana First; Mastery of Your Anxiety and Worry: Workbook by Earlie Counts). Assign the client homework each session in which he/she practices relaxation exercises daily, gradually applying them progressively from non-anxiety-provoking to anxiety-provoking situations; review and reinforce  success while providing corrective feedback toward improvement. Teach the client calming/relaxation skills (e.g., applied relaxation, progressive muscle relaxation, cue controlled relaxation; mindful breathing; biofeedback) and how to discriminate better between relaxation and tension; teach the client how to apply these skills to his/her daily life. 7. Recognize, accept, and cope with feelings of depression. 8. Reduce overall frequency, intensity, and duration of the anxiety so that daily functioning is not impaired. 9. Resolve the core conflict that is the source of anxiety. 10. Stabilize anxiety level while increasing ability to function on a daily basis. Diagnosis F43.23 Conditions For Discharge Achievement of treatment goals and objectives   Salomon Fick, LCSW

## 2023-07-18 ENCOUNTER — Other Ambulatory Visit: Payer: Self-pay | Admitting: Medical Genetics

## 2023-07-18 DIAGNOSIS — Z006 Encounter for examination for normal comparison and control in clinical research program: Secondary | ICD-10-CM

## 2023-07-30 ENCOUNTER — Other Ambulatory Visit
Admission: RE | Admit: 2023-07-30 | Discharge: 2023-07-30 | Disposition: A | Discharge status: Home / Self Care | Payer: Self-pay | Source: Ambulatory Visit | Attending: Medical Genetics | Admitting: Medical Genetics

## 2023-07-30 DIAGNOSIS — Z006 Encounter for examination for normal comparison and control in clinical research program: Secondary | ICD-10-CM | POA: Insufficient documentation

## 2023-08-08 LAB — HELIX MOLECULAR SCREEN: Genetic Analysis Overall Interpretation: NEGATIVE

## 2023-08-08 LAB — GENECONNECT MOLECULAR SCREEN

## 2023-09-05 ENCOUNTER — Ambulatory Visit: Payer: 59 | Admitting: Psychology

## 2023-09-05 DIAGNOSIS — F4323 Adjustment disorder with mixed anxiety and depressed mood: Secondary | ICD-10-CM | POA: Diagnosis not present

## 2023-09-05 NOTE — Progress Notes (Signed)
[facility] Counselor Initial Adult Exam  Name: IZABELLA HANSEN Date: 09/05/2023 MRN: [id number] DOB: 12-May-1981 PCP: Margarita Mail, DO  Time spent: 11:00am-11:55am   55 minutes  Guardian/Payee:  n/a    Paperwork requested: No   Reason for Visit /Presenting Problem: Pt present for face-to-face initial assessment update via video.  Pt consents to telehealth video session and is aware of limitations and benefits of virtual sessions. Location of pt: home Location of therapist: home office.  Pt talked about it being a busy time of year bc of her kids' school events and the holidays.   Addressed how pt is handling the stress.   Pt at times has trouble with motivation regarding taking care of herself however this past year she has lost 60 lbs with the help of weight loss medication.   Pt struggles at times with family dynamics in her family of origin and concern about her alcoholic brother.  Pt also worries about her kids and being the best parent she can be.   Reviewed pt's treatment plan for annual update.   Updated pt's treatment plan and IA.  Pt participated in setting treatment goals.   Plan to meet monthly.   Mental Status Exam: Appearance:   Casual     Behavior:  Appropriate  Motor:  Normal  Speech/Language:   Normal Rate  Affect:  Appropriate  Mood:  normal  Thought process:  normal  Thought content:    WNL  Sensory/Perceptual disturbances:    WNL  Orientation:  oriented to person, place, time/date, and situation  Attention:  Good  Concentration:  Good  Memory:  WNL  Fund of knowledge:   Good  Insight:    Good  Judgment:   Good  Impulse Control:  Good     Reported Symptoms:  stress  Risk Assessment: Danger to Self:  No Self-injurious Behavior: No Danger to Others: No Duty to Warn:no Physical Aggression / Violence:No  Access to Firearms a concern: No  Gang Involvement:No  Patient / guardian was educated about steps to take if suicide or  homicide risk level increases between visits: n/a While future psychiatric events cannot be accurately predicted, the patient does not currently require acute inpatient psychiatric care and does not currently meet [facility] involuntary commitment criteria.  Substance Abuse History: Current substance abuse: No     Past Psychiatric History:   Previous psychological history is significant for anxiety and depression Outpatient Providers:Pt has been in therapy in the past. History of Psych Hospitalization: No  Psychological Testing:  n/a    Abuse History:  Victim of: No.,  n/a    Report needed: No. Victim of Neglect:No. Perpetrator of  n/a   Witness / Exposure to Domestic Violence: No   Protective Services Involvement: No  Witness to [employer] Violence:  No   Family History:  Family History  Problem Relation Age of Onset   Hypertension Father    Heart disease Father    Hypertension Brother    Hypertension Brother        prediabetes   Breast cancer Maternal Aunt 17       Mothers aunt   Schizophrenia Maternal Uncle    Lymphoma Paternal Grandfather    Alcoholism Other    Lung cancer Other     Living situation: the patient lives with her husband and 2 children.    Family history of depression and anxiety and substance abuse. Pt's brother is alcoholic and plays victim role per pt.  Pt grew up with parents and three brothers.  Pt is youngest and only girl .  Pt states she has always been labeled irrational bc she is a girl and more emotional.   Mental illness on mother's side of family.    Pt feels there was more pressure put on her growing up to be perfect and get good grades bc she was the only girl.   Double standard regarding brothers.    Sexual Orientation: Straight  Relationship Status: married  Name of spouse / other:Chris If a parent, number of children / ages:pt has 2 children ages 4 (daughter)  and 71 (son).  Support Systems: spouse parents  Financial Stress:   No   Income/Employment/Disability: Employment  Financial planner: No   Educational History: Education: Risk manager: Protestant  Any cultural differences that may affect / interfere with treatment:  not applicable   Recreation/Hobbies: reading  Stressors: Loss of dog and father in law.   Marital or family conflict    Strengths: Supportive Relationships, Hopefulness, Self Advocate, and Able to Communicate Effectively  Barriers:  none   Legal History: Pending legal issue / charges: The patient has no significant history of legal issues. History of legal issue / charges:  n/a  Medical History/Surgical History: reviewed Past Medical History:  Diagnosis Date   Allergic rhinitis    Anxiety    Chronic headaches    HORMONAL    Complication of anesthesia    Depression    Elevated blood pressure    Elevated blood pressure affecting pregnancy in third trimester, antepartum 10/16/2017   GERD (gastroesophageal reflux disease)    Gestational hypertension 11/15/2017   Migraines    PONV (postoperative nausea and vomiting)     Past Surgical History:  Procedure Laterality Date   DILATION AND CURETTAGE OF UTERUS     DILATION AND EVACUATION N/A 01/07/2017   Procedure: DILATATION AND EVACUATION;  Surgeon: Vena Austria, MD;  Location: [facility] ORS;  Service: Gynecology;  Laterality: N/A;   TONSILLECTOMY AND ADENOIDECTOMY  1994    Medications: Current Outpatient Medications  Medication Sig Dispense Refill   acetaminophen (TYLENOL) 500 MG tablet Take 500 mg by mouth every 6 (six) hours as needed.     Ascorbic Acid (VITAMIN C) 1000 MG tablet Take 1,000 mg by mouth daily.     buPROPion (WELLBUTRIN XL) 300 MG 24 hr tablet TAKE 1 TABLET BY MOUTH DAILY 90 tablet 1   cetirizine (ZYRTEC ALLERGY) 10 MG tablet Take 1 tablet (10 mg total) by mouth daily. 30 tablet 0   clobetasol cream (TEMOVATE) 0.05 % Apply 1 application topically 2 (two) times daily. 60  each 1   diphenhydrAMINE (BENADRYL) 25 MG tablet Take 25 mg by mouth at bedtime as needed for sleep.     hydrocortisone 2.5 % cream Mix with ketoconazole 2 % and apply topically to affected areas bid. If improved use cream once daily to aa. If clear use Ketoconazole cream only to affected area. 28 g 1   ibuprofen (ADVIL,MOTRIN) 600 MG tablet Take 1 tablet (600 mg total) by mouth every 6 (six) hours as needed for mild pain, moderate pain or cramping. 30 tablet 0   ketoconazole (NIZORAL) 2 % cream Mix with hydrocortisone 2.5 % cream and use to aa bid. If improved use once daily. If still clear use ketoconazole cream to aa only. 30 g 3   magnesium 30 MG tablet Take 30 mg by mouth 2 (two) times daily.  Melatonin-Pyridoxine (MELATIN PO) Take by mouth.     Multiple Vitamin (MULTIVITAMIN) tablet Take 1 tablet by mouth daily.     omeprazole (PRILOSEC) 40 MG capsule Take 1 capsule (40 mg total) by mouth daily. 90 capsule 3   ondansetron (ZOFRAN-ODT) 4 MG disintegrating tablet DISSOLVE 1 TABLET ON THE TONGUE  EVERY 8 HOURS AS NEEDED FOR  NAUSEA OR VOMITING 30 tablet 5   propranolol ER (INDERAL LA) 120 MG 24 hr capsule Take 1 capsule (120 mg total) by mouth daily. 90 capsule 3   Rimegepant Sulfate (NURTEC) 75 MG TBDP DISSOLVE 1 TABLET ON THE TONGUE  DAILY AS NEEDED 48 tablet 3   Semaglutide-Weight Management (WEGOVY) 0.5 MG/0.5ML SOAJ Inject 0.5 mg into the skin once a week. 2 mL 0   sertraline (ZOLOFT) 100 MG tablet Take 1 tablet (100 mg total) by mouth daily. 90 tablet 3   sodium chloride (OCEAN) 0.65 % SOLN nasal spray Place 1 spray into both nostrils as needed for congestion.     SUMAtriptan (IMITREX) 100 MG tablet TAKE 1 TABLET BY MOUTH AS NEEDED FOR UP TO 1 DOSE FOR MIGRAINE .  MAY REPEAT IN 2 HOURS IF  HEADACHE PERSISTS OR RECURS. MAX 2 TABLETS IN 24 HOURS 30 tablet 3   Zinc-Vitamin C (ZINC-A-COLD/VITAMIN C MT) Use as directed in the mouth or throat.     No current facility-administered medications  for this visit.    No Known Allergies  Diagnoses:  F43.23  Plan of Care: Treatment Plan Client Abilities/Strengths  Pt is bright, engaging, and motivated for therapy.  Client Treatment Preferences  Individual therapy.  Client Statement of Needs  Improve copings skills and understand herself better. Improve self esteem.  Symptoms  Depressed or irritable mood. Excessive and/or unrealistic worry that is difficult to control occurring more days than not for at least 6 months about a number of events or activities. Hypervigilance (e.g., feeling constantly on edge, experiencing concentration difficulties, having trouble falling or staying asleep, exhibiting a general state of irritability). Low self-esteem. Problems Addressed  Unipolar Depression, Anxiety Goals 1. Alleviate depressive symptoms and return to previous level of effective functioning. 2. Appropriately grieve the loss in order to normalize mood and to return to previously adaptive level of functioning. Objective Learn and implement behavioral strategies to overcome depression. Target Date: 2024-09-04 Frequency: Monthly  Progress: 53 Modality: individual  Related Interventions Assist the client in developing skills that increase the likelihood of deriving pleasure from behavioral activation (e.g., assertiveness skills, developing an exercise plan, less internal/more external focus, increased social involvement); reinforce success. Engage the client in "behavioral activation," increasing his/her activity level and contact with sources of reward, while identifying processes that inhibit activation. use behavioral techniques such as instruction, rehearsal, role-playing, role reversal, as needed, to facilitate activity in the client's daily life; reinforce success. 3. Develop healthy interpersonal relationships that lead to the alleviation and help prevent the relapse of depression. 4. Develop healthy thinking patterns and beliefs  about self, others, and the world that lead to the alleviation and help prevent the relapse of depression. 5. Enhance ability to effectively cope with the full variety of life's worries and anxieties. 6. Learn and implement coping skills that result in a reduction of anxiety and worry, and improved daily functioning. Objective Learn and implement problem-solving strategies for realistically addressing worries. Target Date: 2024-09-04 Frequency: Monthly  Progress: 51 Modality: individual  Related Interventions Assign the client a homework exercise in which he/she problem-solves a current problem (see  Mastery of Your Anxiety and Worry: Workbook by Elenora Fender and Filbert Schilder or Generalized Anxiety Disorder by Gayleen Orem); review, reinforce success, and provide corrective feedback toward improvement. Teach the client problem-solving strategies involving specifically defining a problem, generating options for addressing it, evaluating the pros and cons of each option, selecting and implementing an optional action, and reevaluating and refining the action. Objective Learn and implement calming skills to reduce overall anxiety and manage anxiety symptoms. Target Date: 2024-09-04 Frequency: Monthly  Progress: 71 Modality: individual  Related Interventions Assign the client to read about progressive muscle relaxation and other calming strategies in relevant books or treatment manuals (e.g., Progressive Relaxation Training by Twana First; Mastery of Your Anxiety and Worry: Workbook by Earlie Counts). Assign the client homework each session in which he/she practices relaxation exercises daily, gradually applying them progressively from non-anxiety-provoking to anxiety-provoking situations; review and reinforce success while providing corrective feedback toward improvement. Teach the client calming/relaxation skills (e.g., applied relaxation, progressive muscle relaxation, cue controlled  relaxation; mindful breathing; biofeedback) and how to discriminate better between relaxation and tension; teach the client how to apply these skills to his/her daily life. 7. Recognize, accept, and cope with feelings of depression. 8. Reduce overall frequency, intensity, and duration of the anxiety so that daily functioning is not impaired. 9. Resolve the core conflict that is the source of anxiety. 10. Stabilize anxiety level while increasing ability to function on a daily basis. Diagnosis F43.23 Conditions For Discharge Achievement of treatment goals and objectives    Salomon Fick, LCSW

## 2023-09-09 ENCOUNTER — Ambulatory Visit: Payer: Managed Care, Other (non HMO) | Admitting: Dermatology

## 2023-09-09 ENCOUNTER — Encounter: Payer: Self-pay | Admitting: Dermatology

## 2023-09-09 DIAGNOSIS — D2261 Melanocytic nevi of right upper limb, including shoulder: Secondary | ICD-10-CM

## 2023-09-09 DIAGNOSIS — D1801 Hemangioma of skin and subcutaneous tissue: Secondary | ICD-10-CM

## 2023-09-09 DIAGNOSIS — W908XXA Exposure to other nonionizing radiation, initial encounter: Secondary | ICD-10-CM

## 2023-09-09 DIAGNOSIS — L578 Other skin changes due to chronic exposure to nonionizing radiation: Secondary | ICD-10-CM | POA: Diagnosis not present

## 2023-09-09 DIAGNOSIS — L603 Nail dystrophy: Secondary | ICD-10-CM

## 2023-09-09 DIAGNOSIS — L814 Other melanin hyperpigmentation: Secondary | ICD-10-CM

## 2023-09-09 DIAGNOSIS — L700 Acne vulgaris: Secondary | ICD-10-CM | POA: Diagnosis not present

## 2023-09-09 DIAGNOSIS — L900 Lichen sclerosus et atrophicus: Secondary | ICD-10-CM

## 2023-09-09 DIAGNOSIS — Z1283 Encounter for screening for malignant neoplasm of skin: Secondary | ICD-10-CM

## 2023-09-09 DIAGNOSIS — D225 Melanocytic nevi of trunk: Secondary | ICD-10-CM

## 2023-09-09 DIAGNOSIS — D229 Melanocytic nevi, unspecified: Secondary | ICD-10-CM

## 2023-09-09 DIAGNOSIS — L821 Other seborrheic keratosis: Secondary | ICD-10-CM

## 2023-09-09 MED ORDER — CLINDAMYCIN PHOSPHATE 1 % EX LOTN: TOPICAL_LOTION | CUTANEOUS | 5 refills | Status: DC

## 2023-09-09 NOTE — Patient Instructions (Addendum)
Nails Recommend applying OTC DermaNail conditioner to cuticle area of fingernails twice daily to help with chipping/breaking.  May take 3-6 months of regular use to get best result.  Could also add OTC Biotin supplement 2.5 mg daily to help strengthen nails.  Recommend mild soap with handwashing followed by a thick moisturizing cream to fingernails.  May use Cutemol cream,which comes with DermaNail, Neutrogena [place] hand cream, or Eucerin cream original.   Avoid using nail strips and polishing nails through the winter. Avoid pushing cuticles back.  Recommend OTC biotin supplement 2.5 -5 mg daily to help strengthen dry brittle nails and hair.  Biotin does not promote hair growth except in the setting of underlying biotin deficiency.  Sometimes biotin can cause false results in thyroid function tests, so let your doctor know if you are taking it on a regular basis.    Acne: Start Clindamycin lotion once or twice daily to face as needed for acne flares.    Recommend daily broad spectrum sunscreen SPF 30+ to sun-exposed areas, reapply every 2 hours as needed. Call for new or changing lesions.  Staying in the shade or wearing long sleeves, sun glasses (UVA+UVB protection) and wide brim hats (4-inch brim around the entire circumference of the hat) are also recommended for sun protection.      Melanoma ABCDEs  Melanoma is the most dangerous type of skin cancer, and is the leading cause of death from skin disease.  You are more likely to develop melanoma if you: Have light-colored skin, light-colored eyes, or red or blond hair Spend a lot of time in the sun Tan regularly, either outdoors or in a tanning bed Have had blistering sunburns, especially during childhood Have a close family member who has had a melanoma Have atypical moles or large birthmarks  Early detection of melanoma is key since treatment is typically straightforward and cure rates are extremely high if we catch it early.    The first sign of melanoma is often a change in a mole or a new dark spot.  The ABCDE system is a way of remembering the signs of melanoma.  A for asymmetry:  The two halves do not match. B for border:  The edges of the growth are irregular. C for color:  A mixture of colors are present instead of an even brown color. D for diameter:  Melanomas are usually (but not always) greater than 6mm - the size of a pencil eraser. E for evolution:  The spot keeps changing in size, shape, and color.  Please check your skin once per month between visits. You can use a small mirror in front and a large mirror behind you to keep an eye on the back side or your body.   If you see any new or changing lesions before your next follow-up, please call to schedule a visit.  Please continue daily skin protection including broad spectrum sunscreen SPF 30+ to sun-exposed areas, reapplying every 2 hours as needed when you're outdoors.   Staying in the shade or wearing long sleeves, sun glasses (UVA+UVB protection) and wide brim hats (4-inch brim around the entire circumference of the hat) are also recommended for sun protection.      Due to recent changes in healthcare laws, you may see results of your pathology and/or laboratory studies on MyChart before the doctors have had a chance to review them. We understand that in some cases there may be results that are confusing or concerning to you. Please understand  that not all results are received at the same time and often the doctors may need to interpret multiple results in order to provide you with the best plan of care or course of treatment. Therefore, we ask that you please give [place] 2 business days to thoroughly review all your results before contacting the office for clarification. Should we see a critical lab result, you will be contacted sooner.   If You Need Anything After Your Visit  If you have any questions or concerns for your doctor, please call our  main line at [phone] and press option 4 to reach your doctor's medical assistant. If no one answers, please leave a voicemail as directed and we will return your call as soon as possible. Messages left after 4 pm will be answered the following business day.   You may also send [place] a message via MyChart. We typically respond to MyChart messages within 1-2 business days.  For prescription refills, please ask your pharmacy to contact our office. Our fax number is [phone].  If you have an urgent issue when the clinic is closed that cannot wait until the next business day, you can page your doctor at the number below.    Please note that while we do our best to be available for urgent issues outside of office hours, we are not available 24/7.   If you have an urgent issue and are unable to reach [place], you may choose to seek medical care at your doctor's office, retail clinic, urgent care center, or emergency room.  If you have a medical emergency, please immediately call 911 or go to the emergency department.  Pager Numbers  - Dr. Gwen Pounds: [phone]  - Dr. Roseanne Reno: [phone]  - Dr. Katrinka Blazing: [phone]   In the event of inclement weather, please call our main line at [phone] for an update on the status of any delays or closures.  Dermatology Medication Tips: Please keep the boxes that topical medications come in in order to help keep track of the instructions about where and how to use these. Pharmacies typically print the medication instructions only on the boxes and not directly on the medication tubes.   If your medication is too expensive, please contact our office at [phone] option 4 or send [place] a message through MyChart.   We are unable to tell what your co-pay for medications will be in advance as this is different depending on your insurance coverage. However, we may be able to find a substitute medication at lower cost or fill out paperwork to get insurance to  cover a needed medication.   If a prior authorization is required to get your medication covered by your insurance company, please allow [place] 1-2 business days to complete this process.  Drug prices often vary depending on where the prescription is filled and some pharmacies may offer cheaper prices.  The website www.goodrx.com contains coupons for medications through different pharmacies. The prices here do not account for what the cost may be with help from insurance (it may be cheaper with your insurance), but the website can give you the price if you did not use any insurance.  - You can print the associated coupon and take it with your prescription to the pharmacy.  - You may also stop by our office during regular business hours and pick up a GoodRx coupon card.  - If you need your prescription sent electronically to a different pharmacy, notify our office through [facility] or  by phone at [phone] option 4.     Si Usted Necesita Algo Despus de Su Visita  Tambin puede enviarnos un mensaje a travs de Clinical cytogeneticist. Por lo general respondemos a los mensajes de MyChart en el transcurso de 1 a 2 das hbiles.  Para renovar recetas, por favor pida a su farmacia que se ponga en contacto con nuestra oficina. Annie Sable de fax es [place] [phone].  Si tiene un asunto urgente cuando la clnica est cerrada y que no puede esperar hasta el siguiente da hbil, puede llamar/localizar a su doctor(a) al nmero que aparece a continuacin.   Por favor, tenga en cuenta que aunque hacemos todo lo posible para estar disponibles para asuntos urgentes fuera del horario de [place], no estamos disponibles las 24 horas del da, los 7 [address]  [address] de la [place].   Si tiene un problema urgente y no puede comunicarse con nosotros, puede optar por buscar atencin mdica  en el consultorio de su doctor(a), en una clnica privada, en un centro de atencin urgente o en una sala de emergencias.  Si tiene Wellsite geologist, por favor llame inmediatamente al 911 o vaya a la sala de emergencias.  Nmeros de bper  - Dr. Gwen Pounds: [phone]  - Dra. Roseanne Reno: [phone]  - Dr. Katrinka Blazing: [phone]   En caso de inclemencias del tiempo, por favor llame a Lacy Duverney principal al [phone] para una actualizacin sobre el [place] de cualquier retraso o cierre.  Consejos para la medicacin en dermatologa: Por favor, guarde las cajas en las que vienen los medicamentos de uso tpico para ayudarle a seguir las instrucciones sobre dnde y cmo usarlos. Las farmacias generalmente imprimen las instrucciones del medicamento slo en las cajas y no directamente en los tubos del [place].   Si su medicamento es muy caro, por favor, pngase en contacto con Rolm Gala llamando al [phone] y presione la opcin 4 o envenos un mensaje a travs de Clinical cytogeneticist.   No podemos decirle cul ser su copago por los medicamentos por adelantado ya que esto es diferente dependiendo de la cobertura de su seguro. Sin embargo, es posible que podamos encontrar un medicamento sustituto a Audiological scientist un formulario para que el seguro cubra el medicamento que se considera necesario.   Si se requiere una autorizacin previa para que su compaa de seguros [place] su medicamento, por favor permtanos de 1 a 2 das hbiles para completar [address]  Los precios de los medicamentos varan con frecuencia dependiendo del Environmental consultant de dnde se surte la receta y alguna farmacias pueden ofrecer precios ms baratos.  El sitio web www.goodrx.com tiene cupones para medicamentos de Health and safety inspector. Los precios aqu no tienen en cuenta lo que podra costar con la ayuda del seguro (puede ser ms barato con su seguro), pero el sitio web puede darle el precio si no utiliz Tourist information centre manager.  - Puede imprimir el cupn correspondiente y llevarlo con su receta a la farmacia.  - Tambin puede pasar por nuestra oficina durante el  horario de atencin regular y Education officer, museum una tarjeta de cupones de GoodRx.  - Si necesita que su receta se enve electrnicamente a una farmacia diferente, informe a nuestra oficina a travs de MyChart de [place] o por telfono llamando al [phone] y presione la opcin 4.

## 2023-09-09 NOTE — Progress Notes (Signed)
Follow-Up Visit   Subjective  Melinda Cortez is a 42 y.o. female who presents for the following: Skin Cancer Screening and Full Body Skin Exam. No personal Hx of skin cancer or dysplastic nevi.   Check thumbnails and toenail. C/O splitting. Thumb x2 years, toenail x1 year. Denies Hx of eczema. Denies rubbing or picking at cuticles. Denies Hx of trauma. No h/o rash on hands.  She has h/o thin brittle nails and uses nail polish and nail strips.  The patient presents for Total-Body Skin Exam (TBSE) for skin cancer screening and mole check. The patient has spots, moles and lesions to be evaluated, some may be new or changing and the patient may have concern these could be cancer.    The following portions of the chart were reviewed this encounter and updated as appropriate: medications, allergies, medical history  Review of Systems:  No other skin or systemic complaints except as noted in HPI or Assessment and Plan.  Objective  Well appearing patient in no apparent distress; mood and affect are within normal limits.  A full examination was performed including scalp, head, eyes, ears, nose, lips, neck, chest, axillae, abdomen, back, buttocks, bilateral upper extremities, bilateral lower extremities, hands, feet, fingers, toes, fingernails, and toenails. All findings within normal limits unless otherwise noted below.   Relevant physical exam findings are noted in the Assessment and Plan.  B/L thumbs, R great toenail B/l thumb medial horizontal ridging and absent cuticle. Proximal nail fold is clear. Nail plate thinning with horizontal ridge at base and absent cuticle at right great toenail.   Assessment & Plan   SKIN CANCER SCREENING PERFORMED TODAY.  ACTINIC DAMAGE - Chronic condition, secondary to cumulative UV/sun exposure - diffuse scaly erythematous macules with underlying dyspigmentation - Recommend daily broad spectrum sunscreen SPF 30+ to sun-exposed areas, reapply every 2  hours as needed.  - Staying in the shade or wearing long sleeves, sun glasses (UVA+UVB protection) and wide brim hats (4-inch brim around the entire circumference of the hat) are also recommended for sun protection.  - Call for new or changing lesions.  LENTIGINES, SEBORRHEIC KERATOSES, HEMANGIOMAS - Benign normal skin lesions - Benign-appearing - Call for any changes  MELANOCYTIC NEVI - Tan-brown and/or pink-flesh-colored symmetric macules and papules - 3 x 2 mm medium dark brown macule at right posterior shoulder/upper back. - Benign appearing on exam today, Stable. - Observation - Call clinic for new or changing moles - Recommend daily use of broad spectrum spf 30+ sunscreen to sun-exposed areas.    LENTIGO vs SK Exam: 0.9 x 0.5 cm light tan macule slightly waxy left mid cheek Due to sun exposure Treatment Plan: Benign-appearing, observe. Recommend daily broad spectrum sunscreen SPF 30+ to sun-exposed areas, reapply every 2 hours as needed.  Call for any changes Benign-appearing. Stable compared to previous visit. Observation.  Call clinic for new or changing moles.  Recommend daily use of broad spectrum spf 30+ sunscreen to sun-exposed areas.   SEBORRHEIC KERATOSIS -  5 mm waxy tan macule at left posterior shoulder - Benign-appearing - Discussed benign etiology and prognosis. - Observe - Call for any changes Benign-appearing. Stable compared to previous visit. Observation.  Call clinic for new or changing moles.  Recommend daily use of broad spectrum spf 30+ sunscreen to sun-exposed areas.   LICHEN SCLEROSUS ET ATROPHICUS Exam: not examined today, patient states is inactive, not using any Rx, no symptoms  Chronic condition with duration or expected duration over one year. Currently  well-controlled.  Lichen sclerosus is a chronic inflammatory condition of unknown cause that frequently involves the vaginal area and less commonly extragenital skin, and is NOT sexually  transmitted. It frequently causes symptoms of pain and burning.  It requires regular monitoring and treatment with topical steroids to minimize inflammation and to reduce risk of scarring. There is also a risk of cancer in the vaginal area which is very low if inflammation is well controlled. Regular checks of the area are recommended. Please call if you notice any new or changing spots within this area.  Treatment Plan: Continue clobetasol as needed for symptom control, can use up to 2 times daily as needed for flares    ACNE VULGARIS Exam: inflammatory papules at right cheek, chin, and forehead  Chronic and persistent condition with duration or expected duration over one year. Condition is symptomatic/ bothersome to patient. Not currently at goal.   Treatment Plan: Start Clindamycin lotion once or twice daily to face as needed for acne flares.    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 Condition is bothersome/symptomatic for patient. Currently flared.   Recommend applying OTC DermaNail conditioner to cuticle area of fingernails twice daily to help with chipping/breaking.  May take 3-6 months of regular use to get best result.  Could also add OTC Biotin supplement 2.5 mg daily to help strengthen nails.  Recommend mild soap with handwashing followed by a thick moisturizing cream to fingernails.  May use Cutemol cream,which comes with DermaNail, Neutrogena [place] hand cream, or Eucerin cream original.   Avoid using nail strips and polishing nails through the winter. Avoid pushing cuticles back  Recommend OTC biotin supplement 2.5 -5 mg daily to help strengthen dry brittle nails and hair.  Biotin does not promote hair growth except in the setting of underlying biotin deficiency.  Sometimes biotin can cause false results in thyroid function tests, so let your doctor know if you are taking it on a regular  basis.   Return in about 1 year (around 09/08/2024) for TBSE.  I, Lawson Radar, CMA, am acting as scribe for Willeen Niece, MD.   Documentation: I have reviewed the above documentation for accuracy and completeness, and I agree with the above.  Willeen Niece, MD

## 2023-09-21 ENCOUNTER — Other Ambulatory Visit: Payer: Self-pay | Admitting: Internal Medicine

## 2023-09-25 NOTE — Telephone Encounter (Signed)
 Requested medication (s) are due for refill today:   Not sure  Requested medication (s) are on the active medication list:   Yes  Future visit scheduled:   No    LOV 01/21/2023 with Dr. Bernardo   Last ordered: No protocol information attached.   Provider to review for refills.   Requested Prescriptions  Pending Prescriptions Disp Refills   buPROPion  (WELLBUTRIN  XL) 300 MG 24 hr tablet [Pharmacy Med Name: buPROPion  HCl ER (XL) 300 MG Oral Tablet Extended Release 24 Hour] 90 tablet 3    Sig: TAKE 1 TABLET BY MOUTH DAILY     There is no refill protocol information for this order

## 2023-10-11 ENCOUNTER — Other Ambulatory Visit: Payer: Self-pay | Admitting: Internal Medicine

## 2023-10-13 ENCOUNTER — Other Ambulatory Visit: Payer: Self-pay

## 2023-10-13 MED ORDER — CLINDAMYCIN PHOSPHATE 1 % EX LOTN: TOPICAL_LOTION | CUTANEOUS | 5 refills | Status: AC

## 2023-10-13 NOTE — Telephone Encounter (Signed)
Requested medication (s) are due for refill today: no  Requested medication (s) are on the active medication list:yes  Last refill:  04/28/23 #90 1 RF  Future visit scheduled: no  Notes to clinic:  called pt to make appt for upcoming refills- Requested too soon   Requested Prescriptions  Pending Prescriptions Disp Refills   buPROPion (WELLBUTRIN XL) 300 MG 24 hr tablet [Pharmacy Med Name: buPROPion HCl ER (XL) 300 MG Oral Tablet Extended Release 24 Hour] 90 tablet 3    Sig: TAKE 1 TABLET BY MOUTH DAILY     Psychiatry: Antidepressants - bupropion Failed - 10/13/2023  9:22 AM      Failed - Valid encounter within last 6 months    Recent Outpatient Visits           8 months ago Class 2 severe obesity with serious comorbidity and body mass index (BMI) of 36.0 to 36.9 in adult, unspecified obesity type [facility])   [facility] Margarita Mail, DO   1 year ago Class 3 severe obesity due to excess calories with body mass index (BMI) of 40.0 to 44.9 in adult, unspecified whether serious comorbidity present [facility])   [facility] Margarita Mail, DO   1 year ago Class 3 severe obesity due to excess calories with body mass index (BMI) of 40.0 to 44.9 in adult, unspecified whether serious comorbidity present [facility])   [facility] Margarita Mail, DO   1 year ago [facility]   [facility] Margarita Mail, DO   1 year ago Class 3 severe obesity due to excess calories with body mass index (BMI) of 40.0 to 44.9 in adult, unspecified whether serious comorbidity present [facility])   [facility] Margarita Mail, DO       Future Appointments             In 11 months Willeen Niece, MD [facility]            Passed - Cr in normal range and within 360 days    Creatinine, Ser  Date Value Ref Range Status  01/28/2023 0.79 0.57 - 1.00 mg/dL Final    Creatinine, Urine  Date Value Ref Range Status  11/15/2017 298 mg/dL Final         Passed - AST in normal range and within 360 days    AST  Date Value Ref Range Status  01/28/2023 18 0 - 40 IU/L Final         Passed - ALT in normal range and within 360 days    ALT  Date Value Ref Range Status  01/28/2023 13 0 - 32 IU/L Final         Passed - Completed PHQ-2 or PHQ-9 in the last 360 days      Passed - Last BP in normal range    BP Readings from Last 1 Encounters:  02/25/23 138/71

## 2023-10-13 NOTE — Progress Notes (Signed)
[employer] requesting refills.

## 2023-10-17 ENCOUNTER — Encounter: Payer: Self-pay | Admitting: Internal Medicine

## 2023-10-17 ENCOUNTER — Ambulatory Visit: Payer: Managed Care, Other (non HMO) | Admitting: Internal Medicine

## 2023-10-17 ENCOUNTER — Other Ambulatory Visit: Payer: Self-pay

## 2023-10-17 VITALS — BP 120/80 | HR 90 | Temp 98.0°F | Resp 16 | Ht 68.0 in | Wt 200.1 lb

## 2023-10-17 DIAGNOSIS — F32A Depression, unspecified: Secondary | ICD-10-CM

## 2023-10-17 DIAGNOSIS — G43009 Migraine without aura, not intractable, without status migrainosus: Secondary | ICD-10-CM | POA: Diagnosis not present

## 2023-10-17 DIAGNOSIS — G4733 Obstructive sleep apnea (adult) (pediatric): Secondary | ICD-10-CM | POA: Diagnosis not present

## 2023-10-17 DIAGNOSIS — F419 Anxiety disorder, unspecified: Secondary | ICD-10-CM | POA: Diagnosis not present

## 2023-10-17 DIAGNOSIS — J309 Allergic rhinitis, unspecified: Secondary | ICD-10-CM

## 2023-10-17 DIAGNOSIS — Z23 Encounter for immunization: Secondary | ICD-10-CM

## 2023-10-17 DIAGNOSIS — E66811 Obesity, class 1: Secondary | ICD-10-CM

## 2023-10-17 DIAGNOSIS — K219 Gastro-esophageal reflux disease without esophagitis: Secondary | ICD-10-CM

## 2023-10-17 MED ORDER — SERTRALINE HCL 100 MG PO TABS: 100.0000 mg | ORAL_TABLET | Freq: Every day | ORAL | 1 refills | Status: DC

## 2023-10-17 NOTE — Assessment & Plan Note (Signed)
Stable on over-the-counter Zyrtec.

## 2023-10-17 NOTE — Assessment & Plan Note (Signed)
Patient is currently on Zepbound 12.5 mg weekly through weight watchers and has done very well, she has lost 60 pounds in total.

## 2023-10-17 NOTE — Assessment & Plan Note (Signed)
Symptoms well-controlled on Prilosec 40 mg daily.

## 2023-10-17 NOTE — Assessment & Plan Note (Signed)
Following with neurology, taking propranolol daily prophylactically and Nurtec or Imitrex as needed.

## 2023-10-17 NOTE — Assessment & Plan Note (Signed)
Moods controlled and stable, continue and refill Wellbutrin 300 mg XL and Zoloft 100 mg daily.

## 2023-10-17 NOTE — Progress Notes (Signed)
Established Patient Office Visit  Subjective   Patient ID: Melinda Cortez, female    DOB: 12-10-1980  Age: 43 y.o. MRN: [id number]  Chief Complaint  Patient presents with   Medical Management of Chronic Issues   Medication Refill    HPI   Melinda Cortez presents to for follow up on chronic medical conditions.   OSA: -Patient had sleep study 07/12/2022 showing moderate OSA -Patient is compliant with CPAP and doing well   Weight Gain: -Now on Zepbound 12.5 mg currently through [facility] clinic, lost 60 pounds overall, tolerating well   Migraine: -Following with Neurology last seen 02/25/23 -Currently on Nurtec 1 week before and after cycle, Imitrex as needed. Propanolol 120 mg daily for ppx as well.  Seasonal Allergies: -Currently on Zyrtec daily for 5 years, symptoms well controlled.   GERD: -Currently on Prilosec 40 mg daily  -Symptoms well controlled  -EGD 03/21/21 showing 2 cm hiatal hernia   MDD: -Mood status: stable  -Current treatment: Wellbutrin 300 mg XL (started in 2017), Zoloft 100 mg (started in 2015) -Satisfied with current treatment?: yes -Duration of current treatment : years -Side effects: no Medication compliance: excellent compliance Psychotherapy/counseling: yes current     10/17/2023    1:25 PM 01/21/2023    8:18 AM 07/22/2022    8:07 AM 06/24/2022    8:26 AM 05/13/2022    3:06 PM  Depression screen PHQ 2/9  Decreased Interest 0 1 0 0 0  Down, Depressed, Hopeless 0 1 0 0 0  PHQ - 2 Score 0 2 0 0 0  Altered sleeping 0 0 1 0 0  Tired, decreased energy 0 0 1 0 0  Change in appetite 0 0 1 0 0  Feeling bad or failure about yourself  0 0 1 0 0  Trouble concentrating 0 0 0 0 0  Moving slowly or fidgety/restless 0 0 0 0 0  Suicidal thoughts 0 0 0 0 0  PHQ-9 Score 0 2 4 0 0  Difficult doing work/chores Not difficult at all Not difficult at all Not difficult at all Not difficult at all Not difficult at all   Health Maintenance: -Blood work  UTD -Mammogram 9/24 Birads-1 -Pap 10/24 negative  Patient Active Problem List   Diagnosis Date Noted   Snoring 05/28/2022   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   Vomiting 01/19/2021   GERD (gastroesophageal reflux disease) 05/17/2019   Late period 05/12/2018   Obesity (BMI 30.0-34.9) 09/09/2017   Migraine 03/04/2017   Elevated BP without diagnosis of hypertension 01/14/2017   Allergic rhinitis 09/12/2016   Anxiety and depression 01/30/2016   Past Medical History:  Diagnosis Date   Allergic rhinitis    Anxiety    Chronic headaches    HORMONAL    Complication of anesthesia    Depression    Elevated blood pressure    Elevated blood pressure affecting pregnancy in third trimester, antepartum 10/16/2017   GERD (gastroesophageal reflux disease)    Gestational hypertension 11/15/2017   Migraines    PONV (postoperative nausea and vomiting)    Past Surgical History:  Procedure Laterality Date   DILATION AND CURETTAGE OF UTERUS     DILATION AND EVACUATION N/A 01/07/2017   Procedure: DILATATION AND EVACUATION;  Surgeon: Vena Austria, MD;  Location: [facility] ORS;  Service: Gynecology;  Laterality: N/A;   TONSILLECTOMY AND ADENOIDECTOMY  1994   Social History   Tobacco Use   Smoking status: Former    Current packs/day: 0.00  Average packs/day: 0.3 packs/day for 16.0 years (4.0 ttl pk-yrs)    Types: Cigarettes    Start date: 01/06/1998    Quit date: 01/06/2014    Years since quitting: 9.7   Smokeless tobacco: Never  Vaping Use   Vaping status: Never Used  Substance Use Topics   Alcohol use: Yes    Alcohol/week: 0.0 standard drinks of alcohol    Comment: rare   Drug use: No   Social History   Socioeconomic History   Marital status: Married    Spouse name: Not on file   Number of children: 1   Years of education: Not on file   Highest education level: Not on file  Occupational History   Not on file  Tobacco Use   Smoking status: Former    Current packs/day: 0.00     Average packs/day: 0.3 packs/day for 16.0 years (4.0 ttl pk-yrs)    Types: Cigarettes    Start date: 01/06/1998    Quit date: 01/06/2014    Years since quitting: 9.7   Smokeless tobacco: Never  Vaping Use   Vaping status: Never Used  Substance and Sexual Activity   Alcohol use: Yes    Alcohol/week: 0.0 standard drinks of alcohol    Comment: rare   Drug use: No   Sexual activity: Yes    Birth control/protection: None  Other Topics Concern   Not on file  Social History Narrative   Not on file   Social Drivers of Health   Financial Resource Strain: Low Risk  (07/22/2023)   Received from [facility]   Overall Financial Resource Strain (CARDIA)    Difficulty of Paying Living Expenses: Not very hard  Food Insecurity: No Food Insecurity (07/22/2023)   [facility] from [facility]   Hunger Vital Sign    Worried About Running Out of Food in the Last Year: Never true    Ran Out of Food in the Last Year: Never true  Transportation Needs: No Transportation Needs (07/22/2023)   Received from [facility]    In the past 12 months, has lack of transportation kept you from medical appointments or from getting medications?: No    Lack of Transportation (Non-Medical): No  Physical Activity: Not on file  Stress: Not on file  Social Connections: Not on file  Intimate Partner Violence: Not on file   Family Status  Relation Name Status   Mother  Alive   Father  Alive   Brother [name] Alive   Brother Wes Alive   Mat Aunt mat great aunt (Not Specified)   Nurse, mental health  (Not Specified)   PGF  (Not Specified)   Other  (Not Specified)   Other  (Not Specified)  No partnership data on file   Family History  Problem Relation Age of Onset   Hypertension Father    Heart disease Father    Hypertension Brother    Hypertension Brother        prediabetes   Breast cancer Maternal Aunt 35       Mothers aunt   Schizophrenia  Maternal Uncle    Lymphoma Paternal Grandfather    Alcoholism Other    Lung cancer Other    No Known Allergies    Review of Systems  All other systems reviewed and are negative.     Objective:     BP 120/80 (Cuff Size: Large)   Pulse 90   Temp 98  F (36.7 C) (Oral)   Resp 16   Ht 5\' 8"  (1.727 m)   Wt 200 lb 1.6 oz (90.8 kg)   BMI 30.43 kg/m  BP Readings from Last 3 Encounters:  10/17/23 120/80  02/25/23 138/71  01/21/23 118/78   Wt Readings from Last 3 Encounters:  10/17/23 200 lb 1.6 oz (90.8 kg)  02/25/23 224 lb 3.2 oz (101.7 kg)  01/21/23 224 lb 14.4 oz (102 kg)      Physical Exam Constitutional:      Appearance: Normal appearance.  HENT:     Head: Normocephalic and atraumatic.     Mouth/Throat:     Mouth: Mucous membranes are moist.     Pharynx: Oropharynx is clear.  Eyes:     Extraocular Movements: Extraocular movements intact.     Conjunctiva/sclera: Conjunctivae normal.     Pupils: Pupils are equal, round, and reactive to light.  Cardiovascular:     Rate and Rhythm: Normal rate and regular rhythm.  Pulmonary:     Effort: Pulmonary effort is normal.     Breath sounds: Normal breath sounds.  Skin:    General: Skin is warm and dry.  Neurological:     General: No focal deficit present.     Mental Status: She is alert. Mental status is at baseline.  Psychiatric:        Mood and Affect: Mood normal.        Behavior: Behavior normal.      No results found for any visits on 10/17/23.  Last CBC Lab Results  Component Value Date   WBC 4.9 01/21/2023   HGB 12.7 01/21/2023   HCT 39.0 01/21/2023   MCV 94 01/21/2023   MCH 30.5 01/21/2023   RDW 12.6 01/21/2023   PLT 171 01/21/2023   Last metabolic panel Lab Results  Component Value Date   GLUCOSE 77 01/28/2023   NA 140 01/28/2023   K 4.3 01/28/2023   CL 105 01/28/2023   CO2 23 01/28/2023   BUN 9 01/28/2023   CREATININE 0.79 01/28/2023   EGFR 96 01/28/2023   CALCIUM 9.1 01/28/2023    PROT 6.3 01/28/2023   ALBUMIN 4.2 01/28/2023   LABGLOB 2.1 01/28/2023   AGRATIO 2.0 01/28/2023   BILITOT 0.2 01/28/2023   ALKPHOS 67 01/28/2023   AST 18 01/28/2023   ALT 13 01/28/2023   ANIONGAP 10 11/15/2017   Last lipids Lab Results  Component Value Date   CHOL 160 01/21/2023   HDL 46 01/21/2023   LDLCALC 86 01/21/2023   TRIG 164 (H) 01/21/2023   CHOLHDL 3.5 01/21/2023   Last hemoglobin A1c Lab Results  Component Value Date   HGBA1C 5.0 01/21/2023   Last thyroid functions No results found for: "TSH", "T3TOTAL", "T4TOTAL", "THYROIDAB" Last vitamin D No results found for: "25OHVITD2", "25OHVITD3", "VD25OH" Last vitamin B12 and Folate Lab Results  Component Value Date   VITAMINB12 560 06/19/2021   FOLATE >20.0 01/30/2016      The 10-year ASCVD risk score (Arnett DK, et al., 2019) is: 0.5%    Assessment & Plan:  Anxiety and depression Assessment & Plan: Moods controlled and stable, continue and refill Wellbutrin 300 mg XL and Zoloft 100 mg daily.  Orders: -     Sertraline HCl; Take 1 tablet (100 mg total) by mouth daily.  Dispense: 90 tablet; Refill: 1  Need for influenza vaccination -     Flu vaccine trivalent PF, 6mos and older(Flulaval,Afluria,Fluarix,Fluzone)  OSA (obstructive sleep apnea)  Migraine without  aura and without status migrainosus, not intractable Assessment & Plan: Following with neurology, taking propranolol daily prophylactically and Nurtec or Imitrex as needed.   Allergic rhinitis, unspecified seasonality, unspecified trigger Assessment & Plan: Stable on over-the-counter Zyrtec.   Gastroesophageal reflux disease, unspecified whether esophagitis present Assessment & Plan: Symptoms well-controlled on Prilosec 40 mg daily.   Obesity (BMI 30.0-34.9) Assessment & Plan: Patient is currently on Zepbound 12.5 mg weekly through [facility] and has done very well, she has lost 60 pounds in total.      Return in about 6 months  (around 04/15/2024).    Margarita Mail, DO

## 2023-11-10 ENCOUNTER — Ambulatory Visit: Payer: 59 | Admitting: Psychology

## 2023-11-10 DIAGNOSIS — F4323 Adjustment disorder with mixed anxiety and depressed mood: Secondary | ICD-10-CM

## 2023-11-10 NOTE — Progress Notes (Signed)
 [facility] Counselor/Therapist Progress Note  Patient ID: MADA SADIK, MRN: [id number],    Date: 11/10/2023  Time Spent: 10:00am-10:55am   55 minutes   Treatment Type: Individual Therapy  Reported Symptoms: stress, worry  Mental Status Exam: Appearance:  Casual     Behavior: Appropriate  Motor: Normal  Speech/Language:  Normal Rate  Affect: Appropriate  Mood: normal  Thought process: normal  Thought content:   WNL  Sensory/Perceptual disturbances:   WNL  Orientation: oriented to person, place, time/date, and situation  Attention: Good  Concentration: Good  Memory: WNL  Fund of knowledge:  Good  Insight:   Good  Judgment:  Good  Impulse Control: Good   Risk Assessment: Danger to Self:  No Self-injurious Behavior: No Danger to Others: No Duty to Warn:no Physical Aggression / Violence:No  Access to Firearms a concern: No  Gang Involvement:No   Subjective: Pt present for face-to-face individual therapy via video.  Pt consents to telehealth video session and is aware of limitations and benefits of virtual sessions.   Location of pt: home Location of therapist: home office.   Pt states there is a lot going on with her family.   Pt's father is having health issues.  He has an aortic anorysm and will need surgery on his heart valve.  He may need open heart surgery which is worrisome to pt.   Pt talked about her brother [name].  He is moving back to [place] and is staying with pt's parents temporarily.   [name] does have a job and is sober.  He will be able to help pt's parents out especially when pt's father has surgery.   Pt has continued to lose weight and is 20 lbs from her goal weight.  She has lost 70 lbs and is feeling much better and healthier.   Pt states her mood has been stable overall.  There have been moments that she has felt anxiety bc of family issues.  Worked on calming strategies.   Worked on self care strategies.   Provided supportive therapy.    Interventions: Cognitive Behavioral Therapy and Insight-Oriented  Diagnosis:  F43.23  Plan of Care: Recommend ongoing therapy.   Pt participated in setting treatment goals.  Pt agrees with treatment plan.   Treatment Plan Client Abilities/Strengths  Pt is bright, engaging, and motivated for therapy.  Client Treatment Preferences  Individual therapy.  Client Statement of Needs  Improve copings skills and understand herself better. Improve self esteem.  Symptoms  Depressed or irritable mood. Excessive and/or unrealistic worry that is difficult to control occurring more days than not for at least 6 months about a number of events or activities. Hypervigilance (e.g., feeling constantly on edge, experiencing concentration difficulties, having trouble falling or staying asleep, exhibiting a general state of irritability). Low self-esteem. Problems Addressed  Unipolar Depression, Anxiety Goals 1. Alleviate depressive symptoms and return to previous level of effective functioning. 2. Appropriately grieve the loss in order to normalize mood and to return to previously adaptive level of functioning. Objective Learn and implement behavioral strategies to overcome depression. Target Date: 2024-09-04 Frequency: Monthly  Progress: 38 Modality: individual  Related Interventions Assist the client in developing skills that increase the likelihood of deriving pleasure from behavioral activation (e.g., assertiveness skills, developing an exercise plan, less internal/more external focus, increased social involvement); reinforce success. Engage the client in "behavioral activation," increasing his/her activity level and contact with sources of reward, while identifying processes that inhibit activation. use behavioral techniques  such as instruction, rehearsal, role-playing, role reversal, as needed, to facilitate activity in the client's daily life; reinforce success. 3. Develop healthy interpersonal  relationships that lead to the alleviation and help prevent the relapse of depression. 4. Develop healthy thinking patterns and beliefs about self, others, and the world that lead to the alleviation and help prevent the relapse of depression. 5. Enhance ability to effectively cope with the full variety of life's worries and anxieties. 6. Learn and implement coping skills that result in a reduction of anxiety and worry, and improved daily functioning. Objective Learn and implement problem-solving strategies for realistically addressing worries. Target Date: 2024-09-04 Frequency: Monthly  Progress: 62 Modality: individual  Related Interventions Assign the client a homework exercise in which he/she problem-solves a current problem (see Mastery of Your Anxiety and Worry: Workbook by Elenora Fender and Filbert Schilder or Generalized Anxiety Disorder by Elesa Hacker, and Filbert Schilder); review, reinforce success, and provide corrective feedback toward improvement. Teach the client problem-solving strategies involving specifically defining a problem, generating options for addressing it, evaluating the pros and cons of each option, selecting and implementing an optional action, and reevaluating and refining the action. Objective Learn and implement calming skills to reduce overall anxiety and manage anxiety symptoms. Target Date: 2024-09-04 Frequency: Monthly  Progress: 61 Modality: individual  Related Interventions Assign the client to read about progressive muscle relaxation and other calming strategies in relevant books or treatment manuals (e.g., Progressive Relaxation Training by Twana First; Mastery of Your Anxiety and Worry: Workbook by Earlie Counts). Assign the client homework each session in which he/she practices relaxation exercises daily, gradually applying them progressively from non-anxiety-provoking to anxiety-provoking situations; review and reinforce success while providing corrective feedback  toward improvement. Teach the client calming/relaxation skills (e.g., applied relaxation, progressive muscle relaxation, cue controlled relaxation; mindful breathing; biofeedback) and how to discriminate better between relaxation and tension; teach the client how to apply these skills to his/her daily life. 7. Recognize, accept, and cope with feelings of depression. 8. Reduce overall frequency, intensity, and duration of the anxiety so that daily functioning is not impaired. 9. Resolve the core conflict that is the source of anxiety. 10. Stabilize anxiety level while increasing ability to function on a daily basis. Diagnosis F43.23 Conditions For Discharge Achievement of treatment goals and objectives   Salomon Fick, LCSW

## 2023-11-21 NOTE — Progress Notes (Signed)
 NEUROLOGY FOLLOW UP OFFICE NOTE  Melinda Cortez [id number]  Assessment/Plan:   Menstrually related migraine, without status migrainosus, not intractable       Migraine prevention:  propranolol 120mg  daily.  Migraine rescue:  sumatriptan 100mg .  Zofran ODT 4mg   For perimenstrual prophylaxis,Nurtec 75mg  daily for 14 days beginning first day of period  For neck pain, cyclobenzaprine 10mg  as needed.  Caution for drowsiness. Limit use of pain relievers to no more than 2 days out of week to prevent risk of rebound or medication-overuse headache. Keep headache diary Follow up 9 months.     Subjective:  Melinda Cortez is a 43 year old female with depression and anxiety who follows up for headache and bilateral leg pain/weakness.   UPDATE: Migraine: Intensity:  mild to severe Duration:  an hour with sumatriptan  50% of time needs to repeat dose.   Frequency:  5 a month Notes right posterior neck tightness about 2 weeks after menses.  This may trigger a migraine.         Perimenstrual prophylaxis:  take Nurtec 75mg  daily for 14 days beginning first day of period.  Current NSAIDS/analgesics:  Tylenol Current triptans:  sumatriptan 100mg  Current ergotamine:  none Current anti-emetic:  Zofran ODT 4mg  Current muscle relaxants:  none Current Antihypertensive medications:  propranolol ER 120mg  daily Current Antidepressant medications:  sertraline 100mg  QD, Wellbutrin XL 300mg  QD Current Anticonvulsant medications:  none Current anti-CGRP:  Nurtec (perimenstrual prophylaxis) Current Vitamins/Herbal/Supplements:  none Current Antihistamines/Decongestants:  Zyrtec, Benadryl Other therapy:  heating pad Hormone/birth control:  none       Caffeine:  Diet soda on weekends.  Occasional coffee. Alcohol:  on occasion Smoker:  no Diet:  close to 64 oz water daily.  Skips breakfast Exercise:  yes Depression:  yes; Anxiety:  yes - treated Other pain: knee pain Sleep:  Started APAP  for OSA.     HISTORY:  Onset:  late 48s - early 30s, worse over past 2 years. Location:  left retro-orbital Quality:  pressure/stabbing Intensity:  Mild and severe.   Aura:  absent Prodrome:  absent Associated symptoms:  Nausea, vomiting, photophobia, phonophobia, osmophobia, feels like she may not have "full vision" but no blurred vision.  She denies associated  unilateral numbness or weakness. Duration:  severe pain lasts 1 day with lingering mild headache for 6 days - used to occur during week of menstrual period but more recently occurring week after or 2 weeks after Frequency:  once a month Frequency of abortive medication: 7 days a month Triggers:  hormonal Relieving factors:  heating pad on head, sleep in dark room Activity:  movement/activity aggravates She was seen in the ED in May 2022 for intractable migraine.  Due to nausea, wasn't able to keep her Relpax down.  Received headache cocktail which helped.   She had her son in 2019.  She received an epidural.  Since then, she has had intermittent shooting pains down both legs, typically anterior thigh.  She does report some low back pain.  No leg weakness or numbness.  No bowel or bladder dysfunction.  For low back and bilateral leg pain, she was referred to physical therapy.  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  She was referred to neurosurgery.  They stated there wasn't  anything surgical but she may respond to an injection.  However, she exhibited hyperreflexes in the upper as well as lower extremities.   MRI of C-spine on 10/23/2021 showed mild spinal stenosis at C3-4 and C5-6 with moderate right foraminal narrowing and small central disc protrusion at C6-7 without significant stenosis but no compressive or non-compressive  myelopathy.  MRI of T-spine revealed central disc protrusion at T11-2 and small left paracentral disc protrusion at T10-11 both causing mild cord flattening but without cord signal changes.       Past NSAIDS/analgesics:  ibuprofen, meloxicam Past abortive triptans:  eletriptan, rizatriptan Past abortive ergotamine:  none Past muscle relaxants:  none Past anti-emetic:  Reglan, Compazine, promethazine Past antihypertensive medications:  labetolol Past antidepressant medications:  none Past anticonvulsant medications:  none Past anti-CGRP:  none Past vitamins/Herbal/Supplements:  none Past antihistamines/decongestants:  hydroxyzine Other past therapies:  prednisone     Family history of headache:  brother, father, paternal grandmother.  Reports that her brother was diagnosed with "brain atrophy".  He is an alcoholic.    PAST MEDICAL HISTORY: Past Medical History:  Diagnosis Date   Allergic rhinitis    Anxiety    Chronic headaches    HORMONAL    Complication of anesthesia    Depression    Elevated blood pressure    Elevated blood pressure affecting pregnancy in third trimester, antepartum 10/16/2017   GERD (gastroesophageal reflux disease)    Gestational hypertension 11/15/2017   Migraines    PONV (postoperative nausea and vomiting)     MEDICATIONS: Current Outpatient Medications on File Prior to Visit  Medication Sig Dispense Refill   acetaminophen (TYLENOL) 500 MG tablet Take 500 mg by mouth every 6 (six) hours as needed.     Ascorbic Acid (VITAMIN C) 1000 MG tablet Take 1,000 mg by mouth daily.     buPROPion (WELLBUTRIN XL) 300 MG 24 hr tablet TAKE 1 TABLET BY MOUTH DAILY 90 tablet 1   cetirizine (ZYRTEC ALLERGY) 10 MG tablet Take 1 tablet (10 mg total) by mouth daily. 30 tablet 0   clindamycin (CLEOCIN-T) 1 % lotion Apply once or twice daily to face as needed for acne 60 mL 5   clobetasol cream (TEMOVATE) 0.05 % Apply 1 application topically 2 (two) times daily. 60 each 1    diphenhydrAMINE (BENADRYL) 25 MG tablet Take 25 mg by mouth at bedtime as needed for sleep.     hydrocortisone 2.5 % cream Mix with ketoconazole 2 % and apply topically to affected areas bid. If improved use cream once daily to aa. If clear use Ketoconazole cream only to affected area. 28 g 1   ibuprofen (ADVIL,MOTRIN) 600 MG tablet Take 1 tablet (600 mg total) by mouth every 6 (six) hours as needed for mild pain, moderate pain or cramping. 30 tablet 0   ketoconazole (NIZORAL) 2 % cream Mix with hydrocortisone 2.5 % cream and use to aa bid. If improved use once daily. If still clear use ketoconazole cream to aa only. 30 g 3   magnesium 30 MG tablet Take 30 mg by mouth 2 (two) times daily.     Melatonin-Pyridoxine (MELATIN PO) Take by mouth.     Multiple Vitamin (MULTIVITAMIN) tablet Take 1 tablet by mouth daily.     omeprazole (PRILOSEC) 40 MG capsule Take 1 capsule (40 mg total) by mouth daily. 90 capsule 3   ondansetron (ZOFRAN-ODT) 4 MG disintegrating tablet DISSOLVE 1 TABLET ON THE TONGUE  EVERY 8 HOURS AS NEEDED FOR  NAUSEA OR VOMITING 30 tablet 5   propranolol ER (INDERAL LA) 120 MG 24 hr capsule Take 1 capsule (120 mg total) by mouth daily. 90 capsule 3   Rimegepant Sulfate (NURTEC) 75 MG TBDP DISSOLVE 1 TABLET ON THE TONGUE  DAILY AS NEEDED 48 tablet 3   sertraline (ZOLOFT) 100 MG tablet Take 1 tablet (100 mg total) by mouth daily. 90 tablet 1   sodium chloride (OCEAN) 0.65 % SOLN nasal spray Place 1 spray into both nostrils as needed for congestion.     SUMAtriptan (IMITREX) 100 MG tablet TAKE 1 TABLET BY MOUTH AS NEEDED FOR UP TO 1 DOSE FOR MIGRAINE .  MAY REPEAT IN 2 HOURS IF  HEADACHE PERSISTS OR RECURS. MAX 2 TABLETS IN 24 HOURS 30 tablet 3   tirzepatide (ZEPBOUND) 10 MG/0.5ML Pen Inject into the skin.     Zinc-Vitamin C (ZINC-A-COLD/VITAMIN C MT) Use as directed in the mouth or throat.     No current facility-administered medications on file prior to visit.    ALLERGIES: No  Known Allergies  FAMILY HISTORY: Family History  Problem Relation Age of Onset   Hypertension Father    Heart disease Father    Hypertension Brother    Hypertension Brother        prediabetes   Breast cancer Maternal Aunt 6       Mothers aunt   Schizophrenia Maternal Uncle    Lymphoma Paternal Grandfather    Alcoholism Other    Lung cancer Other       Objective:  Blood pressure 103/69, pulse 79, height 5\' 6"  (1.676 m), weight 194 lb (88 kg). General: No acute distress.  Patient appears well-groomed.   Head:  Normocephalic/atraumatic Eyes:  Fundi examined but not visualized Neck: supple, no paraspinal tenderness, full range of motion Heart:  Regular rate and rhythm Neurological Exam: alert and oriented.  Speech fluent and not dysarthric, language intact.  CN II-XII intact. Bulk and tone normal, muscle strength 5/5 throughout.  Sensation to light touch intact.  Deep tendon reflexes 2+ throughout.  Finger to nose testing intact.  Gait normal, Romberg negative.   Shon Millet, DO  CC: Margarita Mail, DO

## 2023-11-25 ENCOUNTER — Ambulatory Visit: Payer: Managed Care, Other (non HMO) | Admitting: Neurology

## 2023-11-25 DIAGNOSIS — F419 Anxiety disorder, unspecified: Secondary | ICD-10-CM

## 2023-11-25 DIAGNOSIS — F32A Depression, unspecified: Secondary | ICD-10-CM

## 2023-11-25 MED ORDER — SUMATRIPTAN SUCCINATE 100 MG PO TABS: ORAL_TABLET | ORAL | 3 refills | Status: DC

## 2023-11-25 MED ORDER — ONDANSETRON 4 MG PO TBDP: 4.0000 mg | ORAL_TABLET | ORAL | 5 refills | Status: DC | PRN

## 2023-11-25 MED ORDER — CYCLOBENZAPRINE HCL 10 MG PO TABS: 10.0000 mg | ORAL_TABLET | Freq: Three times a day (TID) | ORAL | 3 refills | Status: DC | PRN

## 2023-11-25 MED ORDER — PROPRANOLOL HCL ER 120 MG PO CP24: 120.0000 mg | ORAL_CAPSULE | Freq: Every day | ORAL | 3 refills | Status: AC

## 2023-11-25 MED ORDER — NURTEC 75 MG PO TBDP: ORAL_TABLET | ORAL | 3 refills | Status: DC

## 2023-11-27 ENCOUNTER — Telehealth: Payer: Self-pay

## 2023-11-27 ENCOUNTER — Encounter: Payer: Self-pay | Admitting: Neurology

## 2023-11-27 MED ORDER — ONDANSETRON 4 MG PO TBDP: 4.0000 mg | ORAL_TABLET | ORAL | 5 refills | Status: DC | PRN

## 2023-11-27 NOTE — Telephone Encounter (Signed)
 Per patient,  Hi! I spoke with a representative at [facility] yesterday regarding the Ondansetron prescription. For reasons I don't fully understand (I think it had something to do with the availability of the generic form of the medication), they were going to charge me over $500 for the refill through them. So they found that our local [facility] has the generic in stock for approximately $20 for a 90 day supply. Can you please send my Rx for this medication to [facility] pharmacy at [address] in [place] instead? Preferably for a 90 day supply. Thank you!

## 2023-12-19 ENCOUNTER — Ambulatory Visit: Payer: 59 | Admitting: Psychology

## 2023-12-19 DIAGNOSIS — F4323 Adjustment disorder with mixed anxiety and depressed mood: Secondary | ICD-10-CM
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
## 2024-02-05 ENCOUNTER — Other Ambulatory Visit (HOSPITAL_COMMUNITY): Payer: Self-pay

## 2024-02-05 ENCOUNTER — Telehealth: Payer: Self-pay | Admitting: Pharmacist

## 2024-02-05 NOTE — Telephone Encounter (Signed)
 Pharmacy Patient Advocate Encounter   Received notification from CoverMyMeds that prior authorization for Nurtec 75MG  dispersible tablets is required/requested.   Insurance verification completed.   The patient is insured through [facility] .   Per test claim: Refill too soon. PA is not needed at this time. Medication was filled 01/19/2024. Next eligible fill date is 03/27/2024.

## 2024-02-20 ENCOUNTER — Ambulatory Visit: Admitting: Psychology

## 2024-02-20 DIAGNOSIS — F4323 Adjustment disorder with mixed anxiety and depressed mood: Secondary | ICD-10-CM
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
## 2024-03-15 ENCOUNTER — Other Ambulatory Visit: Payer: Self-pay | Admitting: Internal Medicine

## 2024-03-16 NOTE — Telephone Encounter (Signed)
 Requested medication (s) are due for refill today: na   Requested medication (s) are on the active medication list: yes   Last refill:  10/14/23 #90 1 refills  Future visit scheduled: yes in 1 month  Notes to clinic:  last OV 10/17/23 protocol failed last labs 01/28/23. Do you want to refill Rx?     Requested Prescriptions  Pending Prescriptions Disp Refills   buPROPion  (WELLBUTRIN  XL) 300 MG 24 hr tablet [Pharmacy Med Name: buPROPion  HCl ER (XL) 300 MG Oral Tablet Extended Release 24 Hour] 90 tablet 3    Sig: TAKE 1 TABLET BY MOUTH DAILY     Psychiatry: Antidepressants - bupropion  Failed - 03/16/2024  3:31 PM      Failed - Cr in normal range and within 360 days    Creatinine, Ser  Date Value Ref Range Status  01/28/2023 0.79 0.57 - 1.00 mg/dL Final   Creatinine, Urine  Date Value Ref Range Status  11/15/2017 298 mg/dL Final         Failed - AST in normal range and within 360 days    AST  Date Value Ref Range Status  01/28/2023 18 0 - 40 IU/L Final         Failed - ALT in normal range and within 360 days    ALT  Date Value Ref Range Status  01/28/2023 13 0 - 32 IU/L Final         Failed - Valid encounter within last 6 months    Recent Outpatient Visits   None     Future Appointments             In 1 month Bernardo Fend, DO [facility]   In 6 months Jackquline Sawyer, MD [facility]            Passed - Completed PHQ-2 or PHQ-9 in the last 360 days      Passed - Last BP in normal range    BP Readings from Last 1 Encounters:  11/25/23 103/69

## 2024-04-15 ENCOUNTER — Ambulatory Visit: Payer: Self-pay | Admitting: Internal Medicine

## 2024-04-15 ENCOUNTER — Encounter: Payer: Self-pay | Admitting: Internal Medicine

## 2024-04-15 ENCOUNTER — Other Ambulatory Visit: Payer: Self-pay

## 2024-04-15 VITALS — BP 118/72 | HR 94 | Temp 97.9°F | Resp 16 | Ht 68.0 in | Wt 168.7 lb

## 2024-04-15 DIAGNOSIS — Z1231 Encounter for screening mammogram for malignant neoplasm of breast: Secondary | ICD-10-CM

## 2024-04-15 DIAGNOSIS — F32A Depression, unspecified: Secondary | ICD-10-CM

## 2024-04-15 DIAGNOSIS — G43009 Migraine without aura, not intractable, without status migrainosus: Secondary | ICD-10-CM | POA: Diagnosis not present

## 2024-04-15 DIAGNOSIS — F419 Anxiety disorder, unspecified: Secondary | ICD-10-CM | POA: Diagnosis not present

## 2024-04-15 DIAGNOSIS — Z13220 Encounter for screening for lipoid disorders: Secondary | ICD-10-CM

## 2024-04-15 DIAGNOSIS — M419 Scoliosis, unspecified: Secondary | ICD-10-CM | POA: Diagnosis not present

## 2024-04-15 DIAGNOSIS — K219 Gastro-esophageal reflux disease without esophagitis: Secondary | ICD-10-CM

## 2024-04-15 DIAGNOSIS — Z0000 Encounter for general adult medical examination without abnormal findings: Secondary | ICD-10-CM | POA: Diagnosis not present

## 2024-04-15 MED ORDER — SERTRALINE HCL 100 MG PO TABS
100.0000 mg | ORAL_TABLET | Freq: Every day | ORAL | 3 refills | Status: DC
Start: 2024-04-15 — End: 2024-04-20

## 2024-04-15 MED ORDER — OMEPRAZOLE 40 MG PO CPDR: 40.0000 mg | DELAYED_RELEASE_CAPSULE | Freq: Every day | ORAL | 3 refills | Status: DC

## 2024-04-15 MED ORDER — BUPROPION HCL ER (XL) 300 MG PO TB24: 300.0000 mg | ORAL_TABLET | Freq: Every day | ORAL | 3 refills | Status: DC

## 2024-04-15 NOTE — Progress Notes (Signed)
 Established Patient Office Visit  Subjective   Patient ID: Melinda Cortez, female    DOB: [birth year]  Age: 43 y.o. MRN: [id number]  Chief Complaint  Patient presents with   Medical Management of Chronic Issues    6 month recheck    HPI  Melinda Cortez presents to for follow up on chronic medical conditions.   Discussed the use of AI scribe software for clinical note transcription with the patient, who gave verbal consent to proceed.  History of Present Illness Melinda Cortez is a 43 year old female who presents for a routine follow-up.  She experiences back pain, particularly in the right hip, and is undergoing chiropractic treatment. X-rays show mild scoliosis and a tilted sacrum. Her mother also has scoliosis.  She has sleep apnea but has not been using her CPAP machine recently. Her husband has not noticed any snoring, and she reports no recent snoring or daytime fatigue.  She takes 15 mg of Zep, which she tolerates well with Zofran . She manages headaches and migraines with regular neurologist visits. A muscle relaxer was added for neck pain before resuming chiropractic care.  She takes Wellbutrin , Zoloft , and omeprazole  regularly. Omeprazole  is taken daily, especially with Zep, due to a hiatal hernia diagnosed five years ago via endoscopy.   Hx of OSA: -Patient had sleep study 07/12/2022 showing moderate OSA -Patient had been using CPAP but since losing weight no longer needs it, no daytime fatigue, snoring, morning headaches, etc.   Weight Gain: -Now on Zepbound 15 mg currently through [facility], lost nearly 100 pounds over the last year. -Taking Zofran  PRN and Prilosec 40 mg daily  Migraine: -Following with Neurology -Currently on Nurtec 1 week before and after cycle, Imitrex  as needed. Propanolol 120 mg daily for ppx as well. Also has prescription for Flexeril  PRN but hasn't had to use since going to the chiropractor   Seasonal Allergies: -Currently on  Zyrtec  daily for 5 years, symptoms well controlled.   GERD: -Currently on Prilosec 40 mg daily  -Symptoms well controlled  -EGD 03/21/21 showing 2 cm hiatal hernia   MDD: -Mood status: stable  -Current treatment: Wellbutrin  300 mg XL (started in 2017), Zoloft  100 mg (started in 2015) -Satisfied with current treatment?: yes -Duration of current treatment : years -Side effects: no Medication compliance: excellent compliance Psychotherapy/counseling: yes current     04/15/2024    2:00 PM 10/17/2023    1:25 PM 01/21/2023    8:18 AM 07/22/2022    8:07 AM 06/24/2022    8:26 AM  Depression screen PHQ 2/9  Decreased Interest 0 0 1 0 0  Down, Depressed, Hopeless 0 0 1 0 0  PHQ - 2 Score 0 0 2 0 0  Altered sleeping 0 0 0 1 0  Tired, decreased energy 0 0 0 1 0  Change in appetite 0 0 0 1 0  Feeling bad or failure about yourself  0 0 0 1 0  Trouble concentrating 0 0 0 0 0  Moving slowly or fidgety/restless 0 0 0 0 0  Suicidal thoughts 0 0 0 0 0  PHQ-9 Score 0 0 2 4 0  Difficult doing work/chores Not difficult at all Not difficult at all Not difficult at all Not difficult at all Not difficult at all   Health Maintenance: -Blood work due -Mammogram 9/24 Birads-1, will order for this year -Pap 10/24 negative  Patient Active Problem List   Diagnosis Date Noted   Snoring 05/28/2022  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   Vomiting 01/19/2021   GERD (gastroesophageal reflux disease) 05/17/2019   Late period 05/12/2018   Obesity (BMI 30.0-34.9) 09/09/2017   Migraine 03/04/2017   Elevated BP without diagnosis of hypertension 01/14/2017   Allergic rhinitis 09/12/2016   Anxiety and depression 01/30/2016   Past Medical History:  Diagnosis Date   Allergic rhinitis    Anxiety    Chronic headaches    HORMONAL    Complication of anesthesia    Depression    Elevated blood pressure    Elevated blood pressure affecting pregnancy in third trimester, antepartum 10/16/2017   GERD  (gastroesophageal reflux disease)    Gestational hypertension 11/15/2017   Migraines    PONV (postoperative nausea and vomiting)    Past Surgical History:  Procedure Laterality Date   DILATION AND CURETTAGE OF UTERUS     DILATION AND EVACUATION N/A 01/07/2017   Procedure: DILATATION AND EVACUATION;  Surgeon: Glory High, MD;  Location: [facility] ORS;  Service: Gynecology;  Laterality: N/A;   TONSILLECTOMY AND ADENOIDECTOMY  1994   Social History   Tobacco Use   Smoking status: Former    Current packs/day: 0.00    Average packs/day: 0.3 packs/day for 16.0 years (4.0 ttl pk-yrs)    Types: Cigarettes    Start date: 01/06/1998    Quit date: 01/06/2014    Years since quitting: 10.2   Smokeless tobacco: Never  Vaping Use   Vaping status: Never Used  Substance Use Topics   Alcohol use: Yes    Alcohol/week: 0.0 standard drinks of alcohol    Comment: rare   Drug use: No   Social History   Socioeconomic History   Marital status: Married    Spouse name: Not on file   Number of children: 1   Years of education: Not on file   Highest education level: Not on file  Occupational History   Not on file  Tobacco Use   Smoking status: Former    Current packs/day: 0.00    Average packs/day: 0.3 packs/day for 16.0 years (4.0 ttl pk-yrs)    Types: Cigarettes    Start date: 01/06/1998    Quit date: 01/06/2014    Years since quitting: 10.2   Smokeless tobacco: Never  Vaping Use   Vaping status: Never Used  Substance and Sexual Activity   Alcohol use: Yes    Alcohol/week: 0.0 standard drinks of alcohol    Comment: rare   Drug use: No   Sexual activity: Yes    Birth control/protection: None  Other Topics Concern   Not on file  Social History Narrative   Not on file   Social Drivers of Health   Financial Resource Strain: Low Risk  (07/22/2023)   Received from [facility]   Overall Financial Resource Strain (CARDIA)    Difficulty of Paying Living Expenses: Not  very hard  Food Insecurity: No Food Insecurity (07/22/2023)   [facility] from [facility]   Hunger Vital Sign    Within the past 12 months, you worried that your food would run out before you got the money to buy more.: Never true    Within the past 12 months, the food you bought just didn't last and you didn't have money to get more.: Never true  Transportation Needs: No Transportation Needs (07/22/2023)   Received from [facility]    In the past 12 months, has lack of transportation kept  you from medical appointments or from getting medications?: No    Lack of Transportation (Non-Medical): No  Physical Activity: Not on file  Stress: Not on file  Social Connections: Not on file  Intimate Partner Violence: Not on file   Family Status  Relation Name Status   Mother  Alive   Father  Alive   Brother Redell Alive   Brother Wes Alive   Mat Aunt mat great aunt (Not Specified)   Nurse, mental health  (Not Specified)   PGF  (Not Specified)   Other  (Not Specified)   Other  (Not Specified)  No partnership data on file   Family History  Problem Relation Age of Onset   Hypertension Father    Heart disease Father    Hypertension Brother    Hypertension Brother        prediabetes   Breast cancer Maternal Aunt 63       Mothers aunt   Schizophrenia Maternal Uncle    Lymphoma Paternal Grandfather    Alcoholism Other    Lung cancer Other    No Known Allergies    Review of Systems  All other systems reviewed and are negative.     Objective:     BP 118/72 (Cuff Size: Large)   Pulse 94   Temp 97.9 F (36.6 C) (Oral)   Resp 16   Ht 5' 8 (1.727 m)   Wt 168 lb 11.2 oz (76.5 kg)   SpO2 94%   BMI 25.65 kg/m  BP Readings from Last 3 Encounters:  04/15/24 118/72  11/25/23 103/69  10/17/23 120/80   Wt Readings from Last 3 Encounters:  04/15/24 168 lb 11.2 oz (76.5 kg)  11/25/23 194 lb (88 kg)  10/17/23 200 lb 1.6 oz (90.8  kg)      Physical Exam Constitutional:      Appearance: Normal appearance.  HENT:     Head: Normocephalic and atraumatic.     Mouth/Throat:     Mouth: Mucous membranes are moist.     Pharynx: Oropharynx is clear.  Eyes:     Extraocular Movements: Extraocular movements intact.     Conjunctiva/sclera: Conjunctivae normal.     Pupils: Pupils are equal, round, and reactive to light.  Neck:     Comments: No thyromegaly Cardiovascular:     Rate and Rhythm: Normal rate and regular rhythm.  Pulmonary:     Effort: Pulmonary effort is normal.     Breath sounds: Normal breath sounds.  Musculoskeletal:     Cervical back: No tenderness.     Right lower leg: No edema.     Left lower leg: No edema.  Lymphadenopathy:     Cervical: No cervical adenopathy.  Skin:    General: Skin is warm and dry.  Neurological:     General: No focal deficit present.     Mental Status: She is alert. Mental status is at baseline.  Psychiatric:        Mood and Affect: Mood normal.        Behavior: Behavior normal.      No results found for any visits on 04/15/24.  Last CBC Lab Results  Component Value Date   WBC 4.9 01/21/2023   HGB 12.7 01/21/2023   HCT 39.0 01/21/2023   MCV 94 01/21/2023   MCH 30.5 01/21/2023   RDW 12.6 01/21/2023   PLT 171 01/21/2023   Last metabolic panel Lab Results  Component Value Date   GLUCOSE 77 01/28/2023  NA 140 01/28/2023   K 4.3 01/28/2023   CL 105 01/28/2023   CO2 23 01/28/2023   BUN 9 01/28/2023   CREATININE 0.79 01/28/2023   EGFR 96 01/28/2023   CALCIUM 9.1 01/28/2023   PROT 6.3 01/28/2023   ALBUMIN 4.2 01/28/2023   LABGLOB 2.1 01/28/2023   AGRATIO 2.0 01/28/2023   BILITOT 0.2 01/28/2023   ALKPHOS 67 01/28/2023   AST 18 01/28/2023   ALT 13 01/28/2023   ANIONGAP 10 11/15/2017   Last lipids Lab Results  Component Value Date   CHOL 160 01/21/2023   HDL 46 01/21/2023   LDLCALC 86 01/21/2023   TRIG 164 (H) 01/21/2023   CHOLHDL 3.5 01/21/2023    Last hemoglobin A1c Lab Results  Component Value Date   HGBA1C 5.0 01/21/2023   Last thyroid functions No results found for: TSH, T3TOTAL, T4TOTAL, THYROIDAB Last vitamin D No results found for: 25OHVITD2, 25OHVITD3, VD25OH Last vitamin B12 and Folate Lab Results  Component Value Date   VITAMINB12 560 06/19/2021   FOLATE >20.0 01/30/2016      The 10-year ASCVD risk score (Arnett DK, et al., 2019) is: 0.5%    Assessment & Plan:   Assessment & Plan Mild Scoliosis with Sacral Tilt X-rays showed mild scoliosis and sacral tilt, contributing to chronic back pain. Chiropractic care recommended for structural management. - Continue chiropractic care for scoliosis and sacral tilt.  Migraine and Neck Pain Under neurologist care for migraines and neck pain. Muscle relaxer added for neck pain. Chiropractic treatment initiated for neck pain relief. - Continue current migraine management plan with neurologist. - Continue chiropractic care for neck pain.  Obstructive Sleep Apnea (OSA) Not using CPAP. No recent snoring observed. Advised to monitor for symptoms indicating need for retesting. - Consider retesting for sleep apnea if symptoms reoccur.  Hiatal Hernia with Gastroesophageal Reflux Disease (GERD) On omeprazole  for GERD. Explained risks of reflux and importance of management to prevent complications. Weight loss may improve symptoms, but medication may increase acid production. - Continue daily omeprazole  for GERD management. - Monitor for symptoms of reflux and adjust treatment as necessary.  Anxiety/Depression Stable, doing well on medications, refilled today.   Annual Physical/General Health Maintenance Due for routine labs and screenings. - Order labs to check for anemia, kidney and liver function, electrolytes, cholesterol, and A1c. - Schedule mammogram after September 18th.  - CBC w/Diff/Platelet - Comprehensive Metabolic Panel (CMET) - Lipid  Profile - HgB A1c - MM 3D SCREENING MAMMOGRAM BILATERAL BREAST; Future - buPROPion  (WELLBUTRIN  XL) 300 MG 24 hr tablet; Take 1 tablet (300 mg total) by mouth daily.  Dispense: 90 tablet; Refill: 3 - sertraline  (ZOLOFT ) 100 MG tablet; Take 1 tablet (100 mg total) by mouth daily.  Dispense: 90 tablet; Refill: 3 - omeprazole  (PRILOSEC) 40 MG capsule; Take 1 capsule (40 mg total) by mouth daily.  Dispense: 90 capsule; Refill: 3   Return in about 1 year (around 04/15/2025).    Sharyle Fischer, DO

## 2024-04-16 LAB — COMPREHENSIVE METABOLIC PANEL WITH GFR
ALT: 10 IU/L (ref 0–32)
AST: 14 IU/L (ref 0–40)
Albumin: 4.2 g/dL (ref 3.9–4.9)
Alkaline Phosphatase: 59 IU/L (ref 44–121)
BUN/Creatinine Ratio: 13 (ref 9–23)
BUN: 10 mg/dL (ref 6–24)
Bilirubin Total: 0.4 mg/dL (ref 0.0–1.2)
CO2: 22 mmol/L (ref 20–29)
Calcium: 9.5 mg/dL (ref 8.7–10.2)
Chloride: 104 mmol/L (ref 96–106)
Creatinine, Ser: 0.79 mg/dL (ref 0.57–1.00)
Globulin, Total: 2.3 g/dL (ref 1.5–4.5)
Glucose: 70 mg/dL (ref 70–99)
Potassium: 4.3 mmol/L (ref 3.5–5.2)
Sodium: 140 mmol/L (ref 134–144)
Total Protein: 6.5 g/dL (ref 6.0–8.5)
eGFR: 96 mL/min/1.73 (ref >59)

## 2024-04-16 LAB — CBC WITH DIFFERENTIAL/PLATELET
Basophils Absolute: 0 x10E3/uL (ref 0.0–0.2)
Basos: 1 %
EOS (ABSOLUTE): 0.1 x10E3/uL (ref 0.0–0.4)
Eos: 2 %
Hematocrit: 36.6 % (ref 34.0–46.6)
Hemoglobin: 12.3 g/dL (ref 11.1–15.9)
Immature Grans (Abs): 0 x10E3/uL (ref 0.0–0.1)
Immature Granulocytes: 0 %
Lymphocytes Absolute: 1.6 x10E3/uL (ref 0.7–3.1)
Lymphs: 34 %
MCH: 31.6 pg (ref 26.6–33.0)
MCHC: 33.6 g/dL (ref 31.5–35.7)
MCV: 94 fL (ref 79–97)
Monocytes Absolute: 0.3 x10E3/uL (ref 0.1–0.9)
Monocytes: 6 %
Neutrophils Absolute: 2.8 x10E3/uL (ref 1.4–7.0)
Neutrophils: 57 %
Platelets: 198 x10E3/uL (ref 150–450)
RBC: 3.89 x10E6/uL (ref 3.77–5.28)
RDW: 13.1 % (ref 11.7–15.4)
WBC: 4.8 x10E3/uL (ref 3.4–10.8)

## 2024-04-16 LAB — LIPID PANEL
Chol/HDL Ratio: 3.4 ratio (ref 0.0–4.4)
Cholesterol, Total: 158 mg/dL (ref 100–199)
HDL: 47 mg/dL (ref >39)
LDL Chol Calc (NIH): 97 mg/dL (ref 0–99)
Triglycerides: 75 mg/dL (ref 0–149)
VLDL Cholesterol Cal: 14 mg/dL (ref 5–40)

## 2024-04-16 LAB — HEMOGLOBIN A1C
Est. average glucose Bld gHb Est-mCnc: 85 mg/dL
Hgb A1c MFr Bld: 4.6 % — ABNORMAL LOW (ref 4.8–5.6)

## 2024-04-18 ENCOUNTER — Other Ambulatory Visit: Payer: Self-pay | Admitting: Internal Medicine

## 2024-04-18 DIAGNOSIS — K219 Gastro-esophageal reflux disease without esophagitis: Secondary | ICD-10-CM

## 2024-04-18 DIAGNOSIS — F419 Anxiety disorder, unspecified: Secondary | ICD-10-CM

## 2024-04-19 ENCOUNTER — Ambulatory Visit: Payer: Self-pay | Admitting: Internal Medicine

## 2024-04-20 NOTE — Telephone Encounter (Signed)
 Change in pharmacy Requested Prescriptions  Pending Prescriptions Disp Refills   sertraline  (ZOLOFT ) 100 MG tablet [Pharmacy Med Name: Sertraline  HCl 100 MG Oral Tablet] 90 tablet 3    Sig: TAKE 1 TABLET BY MOUTH DAILY     Psychiatry:  Antidepressants - SSRI - sertraline  Passed - 04/20/2024 11:24 AM      Passed - AST in normal range and within 360 days    AST  Date Value Ref Range Status  04/15/2024 14 0 - 40 IU/L Final         Passed - ALT in normal range and within 360 days    ALT  Date Value Ref Range Status  04/15/2024 10 0 - 32 IU/L Final         Passed - Completed PHQ-2 or PHQ-9 in the last 360 days      Passed - Valid encounter within last 6 months    Recent Outpatient Visits           5 days ago Annual physical exam   [facility] Bernardo Fend, DO       Future Appointments             In 4 months Jackquline Sawyer, MD [facility]             omeprazole  (PRILOSEC) 40 MG capsule [Pharmacy Med Name: Omeprazole  40 MG Oral Capsule Delayed Release] 90 capsule 3    Sig: TAKE 1 CAPSULE BY MOUTH DAILY     Gastroenterology: Proton Pump Inhibitors Passed - 04/20/2024 11:24 AM      Passed - Valid encounter within last 12 months    Recent Outpatient Visits           5 days ago Annual physical exam   [facility] Bernardo Fend, DO       Future Appointments             In 4 months Jackquline Sawyer, MD [facility]

## 2024-04-29 ENCOUNTER — Encounter: Payer: Self-pay | Admitting: Internal Medicine

## 2024-05-06 ENCOUNTER — Ambulatory Visit (INDEPENDENT_AMBULATORY_CARE_PROVIDER_SITE_OTHER): Admitting: Psychology

## 2024-05-06 DIAGNOSIS — F4323 Adjustment disorder with mixed anxiety and depressed mood: Secondary | ICD-10-CM

## 2024-05-06 NOTE — Progress Notes (Signed)
 [facility] Counselor/Therapist Progress Note  Patient ID: MARKETA MIDKIFF, MRN: [id number],    Date: 05/06/2024  Time Spent: 2:00pm-2:50pm  50 minutes   Treatment Type: Individual Therapy  Reported Symptoms: stress, worry  Mental Status Exam: Appearance:  Casual     Behavior: Appropriate  Motor: Normal  Speech/Language:  Normal Rate  Affect: Appropriate  Mood: normal  Thought process: normal  Thought content:   WNL  Sensory/Perceptual disturbances:   WNL  Orientation: oriented to person, place, time/date, and situation  Attention: Good  Concentration: Good  Memory: WNL  Fund of knowledge:  Good  Insight:   Good  Judgment:  Good  Impulse Control: Good   Risk Assessment: Danger to Self:  No Self-injurious Behavior: No Danger to Others: No Duty to Warn:no Physical Aggression / Violence:No  Access to Firearms a concern: No  Gang Involvement:No   Subjective: Pt present for face-to-face individual therapy via video.  Pt consents to telehealth video session and is aware of limitations and benefits of virtual sessions.   Location of pt: home Location of therapist: home office.   Pt talked about her health.   She has been having back issues and is seeing a [facility].  Pt has mild scoliosis in her low back.  Pt has worried about her back issues.  Pt is trying to get the motivation to exercise but the pain deters her.   Addressed how important movement is to help her back.   Pt talked about getting a new puppy.  Pt has been busy training the puppy and spending the summer with her kids.   Pt talked about her brother Redell.  He has been continuing to work on his sobriety and has gotten a job at [employer].   Pt is relieved that Redell is not drinking.   Redell is handling the legal issues with his lawyer and has been doing community service.  Redell is still living with their parents but it is going well.  Pt states she is proud of [place].   Addressed how everyone turns  to pt for help in her family.   In some ways that feels good to pt but at times it can feel overwhelming.  Addressed how pt can set healthy boundaries.   Worked on self care strategies.   Provided supportive therapy.   Interventions: Cognitive Behavioral Therapy and Insight-Oriented  Diagnosis:  F43.23  Plan of Care: Recommend ongoing therapy.   Pt participated in setting treatment goals.  Pt agrees with treatment plan.   Treatment Plan Client Abilities/Strengths  Pt is bright, engaging, and motivated for therapy.  Client Treatment Preferences  Individual therapy.  Client Statement of Needs  Improve copings skills and understand herself better. Improve self esteem.  Symptoms  Depressed or irritable mood. Excessive and/or unrealistic worry that is difficult to control occurring more days than not for at least 6 months about a number of events or activities. Hypervigilance (e.g., feeling constantly on edge, experiencing concentration difficulties, having trouble falling or staying asleep, exhibiting a general state of irritability). Low self-esteem. Problems Addressed  Unipolar Depression, Anxiety Goals 1. Alleviate depressive symptoms and return to previous level of effective functioning. 2. Appropriately grieve the loss in order to normalize mood and to return to previously adaptive level of functioning. Objective Learn and implement behavioral strategies to overcome depression. Target Date: 2024-09-04 Frequency: Monthly  Progress: 75 Modality: individual  Related Interventions Assist the client in developing skills that increase the likelihood of deriving pleasure  from behavioral activation (e.g., assertiveness skills, developing an exercise plan, less internal/more external focus, increased social involvement); reinforce success. Engage the client in behavioral activation, increasing his/her activity level and contact with sources of reward, while identifying processes that inhibit  activation. use behavioral techniques such as instruction, rehearsal, role-playing, role reversal, as needed, to facilitate activity in the client's daily life; reinforce success. 3. Develop healthy interpersonal relationships that lead to the alleviation and help prevent the relapse of depression. 4. Develop healthy thinking patterns and beliefs about self, others, and the world that lead to the alleviation and help prevent the relapse of depression. 5. Enhance ability to effectively cope with the full variety of life's worries and anxieties. 6. Learn and implement coping skills that result in a reduction of anxiety and worry, and improved daily functioning. Objective Learn and implement problem-solving strategies for realistically addressing worries. Target Date: 2024-09-04 Frequency: Monthly  Progress: 28 Modality: individual  Related Interventions Assign the client a homework exercise in which he/she problem-solves a current problem (see Mastery of Your Anxiety and Worry: Workbook by Richarda and Jonne or Generalized Anxiety Disorder by Delores Filler, and Jonne); review, reinforce success, and provide corrective feedback toward improvement. Teach the client problem-solving strategies involving specifically defining a problem, generating options for addressing it, evaluating the pros and cons of each option, selecting and implementing an optional action, and reevaluating and refining the action. Objective Learn and implement calming skills to reduce overall anxiety and manage anxiety symptoms. Target Date: 2024-09-04 Frequency: Monthly  Progress: 26 Modality: individual  Related Interventions Assign the client to read about progressive muscle relaxation and other calming strategies in relevant books or treatment manuals (e.g., Progressive Relaxation Training by Thornell armin Collier; Mastery of Your Anxiety and Worry: Workbook by Richarda armin Jonne). Assign the client homework each session in  which he/she practices relaxation exercises daily, gradually applying them progressively from non-anxiety-provoking to anxiety-provoking situations; review and reinforce success while providing corrective feedback toward improvement. Teach the client calming/relaxation skills (e.g., applied relaxation, progressive muscle relaxation, cue controlled relaxation; mindful breathing; biofeedback) and how to discriminate better between relaxation and tension; teach the client how to apply these skills to his/her daily life. 7. Recognize, accept, and cope with feelings of depression. 8. Reduce overall frequency, intensity, and duration of the anxiety so that daily functioning is not impaired. 9. Resolve the core conflict that is the source of anxiety. 10. Stabilize anxiety level while increasing ability to function on a daily basis. Diagnosis F43.23 Conditions For Discharge Achievement of treatment goals and objectives   Veva Alma, LCSW

## 2024-05-12 ENCOUNTER — Encounter: Payer: Self-pay | Admitting: Neurology

## 2024-05-14 ENCOUNTER — Other Ambulatory Visit (HOSPITAL_COMMUNITY): Payer: Self-pay

## 2024-05-17 ENCOUNTER — Other Ambulatory Visit (HOSPITAL_COMMUNITY): Payer: Self-pay

## 2024-05-17 ENCOUNTER — Telehealth: Payer: Self-pay

## 2024-05-17 NOTE — Telephone Encounter (Signed)
 Pharmacy Patient Advocate Encounter   Received notification from Patient Advice Request messages that prior authorization for Nurtec 75MG  dispersible tablets is required/requested.   Insurance verification completed.   The patient is insured through [facility] .   Per test claim: PA required; PA submitted to above mentioned insurance via Latent Key/confirmation #/EOC [id number] Status is pending

## 2024-05-17 NOTE — Telephone Encounter (Signed)
 PA request has been Submitted. New Encounter has been or will be created for follow up. For additional info see Pharmacy Prior Auth telephone encounter from 05-17-2024.

## 2024-05-19 NOTE — Telephone Encounter (Signed)
 Pharmacy Patient Advocate Encounter  Received notification from [facility] that Prior Authorization for Nurtec 75MG  dispersible tablets has been APPROVED from 05-17-2024 to 05-17-2026   PA #/Case ID/Reference #: [id number]

## 2024-06-06 ENCOUNTER — Other Ambulatory Visit: Payer: Self-pay | Admitting: Neurology

## 2024-06-11 ENCOUNTER — Ambulatory Visit
Admission: RE | Admit: 2024-06-11 | Discharge: 2024-06-11 | Disposition: A | Discharge status: Home / Self Care | Source: Ambulatory Visit | Attending: Internal Medicine | Admitting: Internal Medicine

## 2024-06-11 DIAGNOSIS — Z1231 Encounter for screening mammogram for malignant neoplasm of breast: Secondary | ICD-10-CM | POA: Diagnosis present

## 2024-06-11 DIAGNOSIS — Z0000 Encounter for general adult medical examination without abnormal findings: Secondary | ICD-10-CM

## 2024-07-03 ENCOUNTER — Other Ambulatory Visit: Payer: Self-pay | Admitting: Neurology

## 2024-07-09 ENCOUNTER — Other Ambulatory Visit: Payer: Self-pay | Admitting: Internal Medicine

## 2024-07-09 DIAGNOSIS — F32A Depression, unspecified: Secondary | ICD-10-CM

## 2024-07-12 NOTE — Telephone Encounter (Signed)
 Too soon for refill, LRF 04/15/24 for 90 and 3 RF.  Requested Prescriptions  Pending Prescriptions Disp Refills   buPROPion  (WELLBUTRIN  XL) 300 MG 24 hr tablet [Pharmacy Med Name: buPROPion  HCl ER (XL) 300 MG Oral Tablet Extended Release 24 Hour] 90 tablet 3    Sig: TAKE 1 TABLET BY MOUTH DAILY     Psychiatry: Antidepressants - bupropion  Passed - 07/12/2024 11:37 AM      Passed - Cr in normal range and within 360 days    Creatinine, Ser  Date Value Ref Range Status  04/15/2024 0.79 0.57 - 1.00 mg/dL Final   Creatinine, Urine  Date Value Ref Range Status  11/15/2017 298 mg/dL Final         Passed - AST in normal range and within 360 days    AST  Date Value Ref Range Status  04/15/2024 14 0 - 40 IU/L Final         Passed - ALT in normal range and within 360 days    ALT  Date Value Ref Range Status  04/15/2024 10 0 - 32 IU/L Final         Passed - Completed PHQ-2 or PHQ-9 in the last 360 days      Passed - Last BP in normal range    BP Readings from Last 1 Encounters:  04/15/24 118/72         Passed - Valid encounter within last 6 months    Recent Outpatient Visits           2 months ago Annual physical exam   [facility] Bernardo Fend, DO       Future Appointments             In 2 months Jackquline Sawyer, MD [facility]

## 2024-07-15 ENCOUNTER — Ambulatory Visit: Admitting: Psychology

## 2024-07-15 DIAGNOSIS — F4323 Adjustment disorder with mixed anxiety and depressed mood: Secondary | ICD-10-CM | POA: Diagnosis not present

## 2024-07-15 NOTE — Progress Notes (Signed)
 [facility] Counselor/Therapist Progress Note  Patient ID: Melinda Cortez, MRN: [id number],    Date: 07/15/2024  Time Spent: 10:00am-10:55am  55 minutes   Treatment Type: Individual Therapy  Reported Symptoms: stress, worry  Mental Status Exam: Appearance:  Casual     Behavior: Appropriate  Motor: Normal  Speech/Language:  Normal Rate  Affect: Appropriate  Mood: normal  Thought process: normal  Thought content:   WNL  Sensory/Perceptual disturbances:   WNL  Orientation: oriented to person, place, time/date, and situation  Attention: Good  Concentration: Good  Memory: WNL  Fund of knowledge:  Good  Insight:   Good  Judgment:  Good  Impulse Control: Good   Risk Assessment: Danger to Self:  No Self-injurious Behavior: No Danger to Others: No Duty to Warn:no Physical Aggression / Violence:No  Access to Firearms a concern: No  Gang Involvement:No   Subjective: Pt present for face-to-face individual therapy via video.  Pt consents to telehealth video session and is aware of limitations and benefits of virtual sessions.   Location of pt: home Location of therapist: home office.   Pt talked about being busy with activities and school with the kids.  She enjoys the activities with the kids.  She is also gearing up for the holidays.   Pt's 20th wedding anniversary is in January.  They plan to go to [facility] to celebrate.  Pt talked about her health.   She has met her weight loss goal and is feeling good about her progress.  She has reduced her portion sizes significantly and increased protein.  Taking Zepbound has helped to reduce her appetite and food noise in her head.  Pt's insurance is covering the Zepbound for now but she worries about the coverage not continuing.  Pt is anxious about regaining weight if she can't get the medication.   Pt's mother has history of anorexia and bulimia which impacted pt a lot growing up.  This affected pt's relationship with  food and body image.  Addressed these issues and helped pt process her feelings.  Pt worries about her daughter's body image and self esteem bc of social pressures.  Addressed how pt can have conversations with her daughter about body image and health and diet issues.   Worked on self care strategies.   Provided supportive therapy.   Interventions: Cognitive Behavioral Therapy and Insight-Oriented  Diagnosis:  F43.23  Plan of Care: Recommend ongoing therapy.   Pt participated in setting treatment goals.  Pt agrees with treatment plan.   Treatment Plan Client Abilities/Strengths  Pt is bright, engaging, and motivated for therapy.  Client Treatment Preferences  Individual therapy.  Client Statement of Needs  Improve copings skills and understand herself better. Improve self esteem.  Symptoms  Depressed or irritable mood. Excessive and/or unrealistic worry that is difficult to control occurring more days than not for at least 6 months about a number of events or activities. Hypervigilance (e.g., feeling constantly on edge, experiencing concentration difficulties, having trouble falling or staying asleep, exhibiting a general state of irritability). Low self-esteem. Problems Addressed  Unipolar Depression, Anxiety Goals 1. Alleviate depressive symptoms and return to previous level of effective functioning. 2. Appropriately grieve the loss in order to normalize mood and to return to previously adaptive level of functioning. Objective Learn and implement behavioral strategies to overcome depression. Target Date: 2024-09-04 Frequency: Monthly  Progress: 35 Modality: individual  Related Interventions Assist the client in developing skills that increase the likelihood of deriving pleasure from  behavioral activation (e.g., assertiveness skills, developing an exercise plan, less internal/more external focus, increased social involvement); reinforce success. Engage the client in behavioral  activation, increasing his/her activity level and contact with sources of reward, while identifying processes that inhibit activation. use behavioral techniques such as instruction, rehearsal, role-playing, role reversal, as needed, to facilitate activity in the client's daily life; reinforce success. 3. Develop healthy interpersonal relationships that lead to the alleviation and help prevent the relapse of depression. 4. Develop healthy thinking patterns and beliefs about self, others, and the world that lead to the alleviation and help prevent the relapse of depression. 5. Enhance ability to effectively cope with the full variety of life's worries and anxieties. 6. Learn and implement coping skills that result in a reduction of anxiety and worry, and improved daily functioning. Objective Learn and implement problem-solving strategies for realistically addressing worries. Target Date: 2024-09-04 Frequency: Monthly  Progress: 68 Modality: individual  Related Interventions Assign the client a homework exercise in which he/she problem-solves a current problem (see Mastery of Your Anxiety and Worry: Workbook by Richarda and Jonne or Generalized Anxiety Disorder by Delores Filler, and Jonne); review, reinforce success, and provide corrective feedback toward improvement. Teach the client problem-solving strategies involving specifically defining a problem, generating options for addressing it, evaluating the pros and cons of each option, selecting and implementing an optional action, and reevaluating and refining the action. Objective Learn and implement calming skills to reduce overall anxiety and manage anxiety symptoms. Target Date: 2024-09-04 Frequency: Monthly  Progress: 91 Modality: individual  Related Interventions Assign the client to read about progressive muscle relaxation and other calming strategies in relevant books or treatment manuals (e.g., Progressive Relaxation Training by Thornell armin Collier; Mastery of Your Anxiety and Worry: Workbook by Richarda armin Jonne). Assign the client homework each session in which he/she practices relaxation exercises daily, gradually applying them progressively from non-anxiety-provoking to anxiety-provoking situations; review and reinforce success while providing corrective feedback toward improvement. Teach the client calming/relaxation skills (e.g., applied relaxation, progressive muscle relaxation, cue controlled relaxation; mindful breathing; biofeedback) and how to discriminate better between relaxation and tension; teach the client how to apply these skills to his/her daily life. 7. Recognize, accept, and cope with feelings of depression. 8. Reduce overall frequency, intensity, and duration of the anxiety so that daily functioning is not impaired. 9. Resolve the core conflict that is the source of anxiety. 10. Stabilize anxiety level while increasing ability to function on a daily basis. Diagnosis F43.23 Conditions For Discharge Achievement of treatment goals and objectives   Melinda Alma, LCSW

## 2024-08-03 ENCOUNTER — Other Ambulatory Visit: Payer: Self-pay | Admitting: Neurology

## 2024-08-26 NOTE — Progress Notes (Unsigned)
 NEUROLOGY FOLLOW UP OFFICE NOTE  FRANCISCA LANGENDERFER [id number]  Assessment/Plan:   Chronic migraine without aura, without status migrainosus, not intractable    over 15 headache days a month for over 3 months.  Failed propranolol  and Nurtec and already on 2 antidepressants.   Migraine prevention:  Plan to start Botox.  Continue propranolol  120mg  daily. Migraine rescue:  sumatriptan  100mg .  Zofran  ODT 4mg   For perimenstrual prophylaxis,Nurtec 75mg  daily for 14 days beginning first day of period  For neck pain, cyclobenzaprine  10mg  as needed.  Caution for drowsiness.  Limit use of pain relievers to no more than 9 days out of month to prevent risk of rebound or medication-overuse headache. Keep headache diary Follow up for planned Botox     Subjective:  ANALAYAH BROOKE is a 43 year old female with depression and anxiety who follows up for headache and bilateral leg pain/weakness.   UPDATE:  Intensity:  mild to severe Duration:  an hour with sumatriptan   50% of time needs to repeat dose.   Frequency:  Has at least a mild headache most days of the month.  4-5 days a month. She has a tension-type headache most days a week.   Notes right posterior neck tightness about 2 weeks after menses.  This may trigger a migraine.  Also having low back pain.         Perimenstrual prophylaxis:  take Nurtec 75mg  daily for 14 days beginning first day of period.   Takes a sumatriptan  or OTC analgesic (Tylenol ) 10 to 15 days a month. Current NSAIDS/analgesics:  Tylenol  Current triptans:  sumatriptan  100mg  Current ergotamine:  none Current anti-emetic:  Zofran  ODT 4mg  Current muscle relaxants:  none Current Antihypertensive medications:  propranolol  ER 120mg  daily Current Antidepressant medications:  sertraline  100mg  QD, Wellbutrin  XL 300mg  QD Current Anticonvulsant medications:  none Current anti-CGRP:  Nurtec (perimenstrual prophylaxis) Current Vitamins/Herbal/Supplements:  none Current  Antihistamines/Decongestants:  Zyrtec , Benadryl  Other therapy:  heating pad Hormone/birth control:  none       Caffeine:  Diet soda on weekends.  Occasional coffee. Alcohol:  on occasion Smoker:  no Diet:  close to 64 oz water daily.  Skips breakfast Exercise:  yes Depression:  yes; Anxiety:  yes - treated Other pain: knee pain Sleep:  With weight loss, no longer has OSA.     HISTORY:  Onset:  late 103s - early 30s, worse over past 2 years. Location:  left retro-orbital Quality:  pressure/stabbing Intensity:  Mild and severe.   Aura:  absent Prodrome:  absent Associated symptoms:  Nausea, vomiting, photophobia, phonophobia, osmophobia, feels like she may not have full vision but no blurred vision.  She denies associated  unilateral numbness or weakness. Duration:  severe pain lasts 1 day with lingering mild headache for 6 days - used to occur during week of menstrual period but more recently occurring week after or 2 weeks after Frequency:  once a month Frequency of abortive medication: 7 days a month Triggers:  hormonal Relieving factors:  heating pad on head, sitting in hot bath, sleep in dark room Activity:  movement/activity aggravates She was seen in the ED in May 2022 for intractable migraine.  Due to nausea, wasn't able to keep her Relpax  down.  Received headache cocktail which helped.   She had her son in 2019.  She received an epidural.  Since then, she has had intermittent shooting pains down both legs, typically anterior thigh.  She does report some low back pain.  No leg weakness or numbness.  No bowel or bladder dysfunction.  For low back and bilateral leg pain, she was referred to physical therapy.  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  She was referred to  neurosurgery.  They stated there wasn't anything surgical but she may respond to an injection.  However, she exhibited hyperreflexes in the upper as well as lower extremities.   MRI of C-spine on 10/23/2021 showed mild spinal stenosis at C3-4 and C5-6 with moderate right foraminal narrowing and small central disc protrusion at C6-7 without significant stenosis but no compressive or non-compressive myelopathy.  MRI of T-spine revealed central disc protrusion at T11-2 and small left paracentral disc protrusion at T10-11 both causing mild cord flattening but without cord signal changes.       Past NSAIDS/analgesics:  ibuprofen , meloxicam  Past abortive triptans:  eletriptan , rizatriptan  Past abortive ergotamine:  none Past muscle relaxants:  none Past anti-emetic:  Reglan , Compazine , promethazine  Past antihypertensive medications:  labetolol Past antidepressant medications:  none Past anticonvulsant medications:  none Past anti-CGRP:  none Past vitamins/Herbal/Supplements:  none Past antihistamines/decongestants:  hydroxyzine  Other past therapies:  prednisone     Family history of headache:  brother, father, paternal grandmother.  Reports that her brother was diagnosed with brain atrophy.  He is an alcoholic.    PAST MEDICAL HISTORY: Past Medical History:  Diagnosis Date   Allergic rhinitis    Anxiety    Chronic headaches    HORMONAL    Complication of anesthesia    Depression    Elevated blood pressure    Elevated blood pressure affecting pregnancy in third trimester, antepartum 10/16/2017   GERD (gastroesophageal reflux disease)    Gestational hypertension 11/15/2017   Migraines    PONV (postoperative nausea and vomiting)     MEDICATIONS: Current Outpatient Medications on File Prior to Visit  Medication Sig Dispense Refill   acetaminophen  (TYLENOL ) 500 MG tablet Take 500 mg by mouth every 6 (six) hours as needed.     Ascorbic Acid (VITAMIN C) 1000 MG tablet Take 1,000 mg by  mouth daily.     buPROPion  (WELLBUTRIN  XL) 300 MG 24 hr tablet Take 1 tablet (300 mg total) by mouth daily. 90 tablet 3   cetirizine  (ZYRTEC  ALLERGY) 10 MG tablet Take 1 tablet (10 mg total) by mouth daily. 30 tablet 0   clindamycin  (CLEOCIN -T) 1 % lotion Apply once or twice daily to face as needed for acne 60 mL 5   clobetasol  cream (TEMOVATE ) 0.05 % Apply 1 application topically 2 (two) times daily. 60 each 1   cyclobenzaprine  (FLEXERIL ) 10 MG tablet Take 1 tablet (10 mg total) by mouth 3 (three) times daily as needed for muscle spasms. 30 tablet 3   diphenhydrAMINE  (BENADRYL ) 25 MG tablet Take 25 mg by mouth at bedtime as needed for sleep.     hydrocortisone  2.5 % cream Mix with ketoconazole  2 % and apply topically to affected areas bid. If improved use cream once daily to aa. If clear use Ketoconazole  cream only to affected area. 28 g 1   ibuprofen  (ADVIL ,MOTRIN ) 600 MG tablet Take 1 tablet (600 mg total) by mouth every 6 (six) hours as needed for mild pain, moderate pain or cramping. 30 tablet 0   ketoconazole  (NIZORAL ) 2 % cream Mix  with hydrocortisone  2.5 % cream and use to aa bid. If improved use once daily. If still clear use ketoconazole  cream to aa only. 30 g 3   magnesium 30 MG tablet Take 30 mg by mouth 2 (two) times daily.     Melatonin-Pyridoxine (MELATIN PO) Take by mouth.     Multiple Vitamin (MULTIVITAMIN) tablet Take 1 tablet by mouth daily.     omeprazole  (PRILOSEC) 40 MG capsule TAKE 1 CAPSULE BY MOUTH DAILY 90 capsule 3   ondansetron  (ZOFRAN -ODT) 4 MG disintegrating tablet TAKE 1 TABLET (4 MG TOTAL) BY MOUTH AS NEEDED FOR NAUSEA OR VOMITING. 30 tablet 0   propranolol  ER (INDERAL  LA) 120 MG 24 hr capsule Take 1 capsule (120 mg total) by mouth daily. 90 capsule 3   Rimegepant Sulfate (NURTEC) 75 MG TBDP DISSOLVE 1 TABLET ON THE TONGUE  DAILY AS NEEDED 48 tablet 3   sertraline  (ZOLOFT ) 100 MG tablet TAKE 1 TABLET BY MOUTH DAILY 90 tablet 3   sodium chloride  (OCEAN) 0.65 % SOLN  nasal spray Place 1 spray into both nostrils as needed for congestion.     SUMAtriptan  (IMITREX ) 100 MG tablet TAKE 1 TABLET BY MOUTH AS NEEDED FOR UP TO 1 DOSE FOR MIGRAINE .  MAY REPEAT IN 2 HOURS IF  HEADACHE PERSISTS OR RECURS. MAX 2 TABLETS IN 24 HOURS 30 tablet 3   tirzepatide (ZEPBOUND) 15 MG/0.5ML Pen Inject 15 mg into the skin once a week.     Zinc-Vitamin C (ZINC-A-COLD/VITAMIN C MT) Use as directed in the mouth or throat.     No current facility-administered medications on file prior to visit.    ALLERGIES: No Known Allergies  FAMILY HISTORY: Family History  Problem Relation Age of Onset   Hypertension Father    Heart disease Father    Hypertension Brother    Hypertension Brother        prediabetes   Breast cancer Maternal Aunt 61       Mothers aunt   Schizophrenia Maternal Uncle    Lymphoma Paternal Grandfather    Alcoholism Other    Lung cancer Other       Objective:  Blood pressure 104/71, pulse 80, height 5' 6 (1.676 m), weight 158 lb 3.2 oz (71.8 kg), SpO2 100%.  General: No acute distress.  Patient appears well-groomed.   Head:  Normocephalic/atraumatic Eyes:  Fundi examined but not visualized Neck: supple, no paraspinal tenderness, full range of motion Heart:  Regular rate and rhythm Neurological Exam: alert and oriented.  Speech fluent and not dysarthric, language intact.  CN II-XII intact. Bulk and tone normal, muscle strength 5/5 throughout.  Sensation to light touch intact.  Deep tendon reflexes 2+ throughout.  Finger to nose testing intact.  Gait normal, Romberg negative.   Juliene Dunnings, DO  CC: Sharyle Fischer, DO

## 2024-08-30 ENCOUNTER — Telehealth: Payer: Self-pay

## 2024-08-30 ENCOUNTER — Ambulatory Visit: Admitting: Neurology

## 2024-08-30 ENCOUNTER — Encounter: Payer: Self-pay | Admitting: Neurology

## 2024-08-30 VITALS — BP 104/71 | HR 80 | Ht 66.0 in | Wt 158.2 lb

## 2024-08-30 DIAGNOSIS — G43709 Chronic migraine without aura, not intractable, without status migrainosus: Secondary | ICD-10-CM

## 2024-08-30 MED ORDER — NURTEC 75 MG PO TBDP: ORAL_TABLET | ORAL | 3 refills | Status: AC

## 2024-08-30 MED ORDER — ONDANSETRON 4 MG PO TBDP: 4.0000 mg | ORAL_TABLET | ORAL | 0 refills | Status: DC | PRN

## 2024-08-30 MED ORDER — CYCLOBENZAPRINE HCL 10 MG PO TABS: 10.0000 mg | ORAL_TABLET | Freq: Three times a day (TID) | ORAL | 3 refills | Status: AC | PRN

## 2024-08-30 MED ORDER — SUMATRIPTAN SUCCINATE 100 MG PO TABS: ORAL_TABLET | ORAL | 3 refills | Status: AC

## 2024-08-30 NOTE — Telephone Encounter (Signed)
 Patient seen in office today. Patient to start Botox 200 units every 90 days.

## 2024-08-30 NOTE — Patient Instructions (Signed)
 Plan to start Botox Otherwise, no change to other medications

## 2024-09-01 ENCOUNTER — Other Ambulatory Visit (HOSPITAL_COMMUNITY): Payer: Self-pay

## 2024-09-01 ENCOUNTER — Telehealth: Payer: Self-pay | Admitting: Pharmacy Technician

## 2024-09-01 NOTE — Telephone Encounter (Signed)
 PA has been submitted, and telephone encounter has been created. Please see telephone encounter dated 12.10.25.

## 2024-09-01 NOTE — Telephone Encounter (Signed)
 Pharmacy Patient Advocate Encounter   Received notification from Pt Calls Messages that prior authorization for BOTOX 200 is required/requested.   Insurance verification completed.   The patient is insured through [employer].   Per test claim: PA required; PA submitted to above mentioned insurance via Latent Key/confirmation #/EOC [id number] Status is pending  Benefit Verification [id number] Submitted

## 2024-09-03 ENCOUNTER — Other Ambulatory Visit (HOSPITAL_COMMUNITY): Payer: Self-pay

## 2024-09-03 NOTE — Telephone Encounter (Signed)
 Unable to submit PA through Latent personal assistant). Manually faxing through [employer] required [phone] and [employer] [phone]

## 2024-09-07 NOTE — Telephone Encounter (Signed)
 Pharmacy Patient Advocate Encounter  Received notification from [employer] that Prior Authorization for BOTOX 200 has been DENIED.  Full denial letter will be uploaded to the media tab. See denial reason below.    PA #/Case ID/Reference #: [id number]

## 2024-09-09 MED ORDER — ONABOTULINUMTOXINA 200 UNITS IJ SOLR: INTRAMUSCULAR | 4 refills | Status: AC

## 2024-09-09 MED ORDER — ONABOTULINUMTOXINA 200 UNITS IJ SOLR: INTRAMUSCULAR | 4 refills | Status: DC

## 2024-09-09 NOTE — Telephone Encounter (Addendum)
 Letter received Botox approved 09/03/24-09/02/25.  [facility].    Script sent to [facility], Letter scanned to [facility] as well as insurance card.   LMOVM for patient as well mychart message sent.

## 2024-09-09 NOTE — Addendum Note (Signed)
 Addended by: OZELL JESUSA PARAS on: 09/09/2024 02:00 PM   Modules accepted: Orders

## 2024-09-09 NOTE — Addendum Note (Signed)
 Addended by: OZELL JESUSA PARAS on: 09/09/2024 02:15 PM   Modules accepted: Orders

## 2024-09-13 ENCOUNTER — Ambulatory Visit: Payer: Managed Care, Other (non HMO) | Admitting: Dermatology

## 2024-09-15 NOTE — Telephone Encounter (Signed)
 Conversation with [facility] pharmacy Rep,   today CMA, My patient ID: [id number] Has an appointment on 10/01/24. I sent over her information on 09/09/24. will her Botox be ready to deliver by then. Hello! A representative will be with you shortly. !OFFVDR_PSC_ROMHEL has joined the conversation. !OFFVDR_PSC_ROMHEL Good day! This is Romhel and Ill be happy to assist you today. Please allow me 2-4 minutes to review the account. !OFFVDR_PSC_ROMHEL By the way, may I know your office phone number? You [phone] !OFFVDR_PSC_ROMHEL Thank you!\ You your welcome !OFFVDR_PSC_ROMHEL Please allow me 2-4 minutes to review the account. You great !OFFVDR_PSC_ROMHEL Thank you for patiently waiting! Upon checking, we received the prescription on 12-18 and the processing time is 5-8 business days. You great, thank you

## 2024-09-21 ENCOUNTER — Ambulatory Visit: Admitting: Dermatology

## 2024-09-21 DIAGNOSIS — Z1283 Encounter for screening for malignant neoplasm of skin: Secondary | ICD-10-CM | POA: Diagnosis not present

## 2024-09-21 DIAGNOSIS — L900 Lichen sclerosus et atrophicus: Secondary | ICD-10-CM

## 2024-09-21 DIAGNOSIS — L989 Disorder of the skin and subcutaneous tissue, unspecified: Secondary | ICD-10-CM

## 2024-09-21 DIAGNOSIS — L814 Other melanin hyperpigmentation: Secondary | ICD-10-CM

## 2024-09-21 DIAGNOSIS — H01131 Eczematous dermatitis of right upper eyelid: Secondary | ICD-10-CM

## 2024-09-21 DIAGNOSIS — L578 Other skin changes due to chronic exposure to nonionizing radiation: Secondary | ICD-10-CM

## 2024-09-21 DIAGNOSIS — D225 Melanocytic nevi of trunk: Secondary | ICD-10-CM | POA: Diagnosis not present

## 2024-09-21 DIAGNOSIS — H019 Unspecified inflammation of eyelid: Secondary | ICD-10-CM

## 2024-09-21 DIAGNOSIS — W908XXA Exposure to other nonionizing radiation, initial encounter: Secondary | ICD-10-CM

## 2024-09-21 DIAGNOSIS — D1801 Hemangioma of skin and subcutaneous tissue: Secondary | ICD-10-CM

## 2024-09-21 DIAGNOSIS — L700 Acne vulgaris: Secondary | ICD-10-CM | POA: Diagnosis not present

## 2024-09-21 DIAGNOSIS — D229 Melanocytic nevi, unspecified: Secondary | ICD-10-CM

## 2024-09-21 DIAGNOSIS — L821 Other seborrheic keratosis: Secondary | ICD-10-CM | POA: Diagnosis not present

## 2024-09-21 DIAGNOSIS — I781 Nevus, non-neoplastic: Secondary | ICD-10-CM

## 2024-09-21 MED ORDER — PIMECROLIMUS 1 % EX CREA: TOPICAL_CREAM | CUTANEOUS | 1 refills | Status: AC

## 2024-09-21 NOTE — Progress Notes (Unsigned)
 "  Follow-Up Visit   Subjective  Melinda Cortez is a 43 y.o. female who presents for the following: Skin Cancer Screening and Full Body Skin Exam  The patient presents for Total-Body Skin Exam (TBSE) for skin cancer screening and mole check. The patient has spots, moles and lesions to be evaluated, some may be new or changing. Dry, scaly eyelids x 3-4 months, tried HC cream and otc eczema cream.  Patient with lichen sclerosus of the vaginal area, using clobetasol  as needed- rarely uses- maybe twice a year. Acne of the face, using clindamycin  lotion which helps.    The following portions of the chart were reviewed this encounter and updated as appropriate: medications, allergies, medical history  Review of Systems:  No other skin or systemic complaints except as noted in HPI or Assessment and Plan.  Objective  Well appearing patient in no apparent distress; mood and affect are within normal limits.  A full examination was performed including scalp, head, eyes, ears, nose, lips, neck, chest, axillae, abdomen, back, buttocks, bilateral upper extremities, bilateral lower extremities, hands, feet, fingers, toes, fingernails, and toenails. All findings within normal limits unless otherwise noted below.   Relevant physical exam findings are noted in the Assessment and Plan.    Assessment & Plan   SKIN CANCER SCREENING PERFORMED TODAY.  ACTINIC DAMAGE - Chronic condition, secondary to cumulative UV/sun exposure - diffuse scaly erythematous macules with underlying dyspigmentation - Recommend daily broad spectrum sunscreen SPF 30+ to sun-exposed areas, reapply every 2 hours as needed.  - Staying in the shade or wearing long sleeves, sun glasses (UVA+UVB protection) and wide brim hats (4-inch brim around the entire circumference of the hat) are also recommended for sun protection.  - Call for new or changing lesions.  LENTIGINES, SEBORRHEIC KERATOSES, HEMANGIOMAS - Benign normal skin  lesions - Benign-appearing - Call for any changes  MELANOCYTIC NEVI - Tan-brown and/or pink-flesh-colored symmetric macules and papules - 4 mm flesh papule with central 2 mm medium dark brown macule right posterior shoulder/upper back  - Benign appearing on exam today - Observation - Call clinic for new or changing moles - Recommend daily use of broad spectrum spf 30+ sunscreen to sun-exposed areas.   LENTIGO vs SK Exam: 0.9 x 0.5 cm light tan macule slightly waxy left mid cheek Due to sun exposure Treatment Plan: Benign-appearing, observe. Recommend daily broad spectrum sunscreen SPF 30+ to sun-exposed areas, reapply every 2 hours as needed.  Call for any changes Benign-appearing. Stable compared to previous visit. Observation.  Call clinic for new or changing moles.  Recommend daily use of broad spectrum spf 30+ sunscreen to sun-exposed areas.    SEBORRHEIC KERATOSIS - 5 mm waxy tan macule at left posterior shoulder - Benign-appearing - Discussed benign etiology and prognosis. - Observe - Call for any changes Benign-appearing. Stable compared to previous visit. Observation.  Call clinic for new or changing moles.  Recommend daily use of broad spectrum spf 30+ sunscreen to sun-exposed areas.  Eyelid Dermatitis Exam: Scaly pink patches BL upper eyelids.  Chronic and persistent condition with duration or expected duration over one year. Condition is bothersome/symptomatic for patient. Currently flared.   Treatment Plan: Start pimecrolimus cream twice daily to eyelids until improved dsp 30g 1Rf.   Spider Veins - Dilated blue, purple or red veins at the lower extremities - Reassured - Smaller vessels can be treated by sclerotherapy (a procedure to inject a medicine into the veins to make them disappear) if desired, but  the treatment is not covered by insurance. Larger vessels may be covered if symptomatic and we would refer to vascular surgeon if treatment desired.  LICHEN  SCLEROSUS ET ATROPHICUS Exam: not examined today, patient states is inactive, no symptoms, only uses Rx about every 6 months.    Chronic condition with duration or expected duration over one year. Currently well-controlled.   Lichen sclerosus is a chronic inflammatory condition of unknown cause that frequently involves the vaginal area and less commonly extragenital skin, and is NOT sexually transmitted. It frequently causes symptoms of pain and burning.  It requires regular monitoring and treatment with topical steroids to minimize inflammation and to reduce risk of scarring. There is also a risk of cancer in the vaginal area which is very low if inflammation is well controlled. Regular checks of the area are recommended. Please call if you notice any new or changing spots within this area.   Treatment Plan: Continue clobetasol  as needed for symptom control, can use up to 2 times daily as needed for flares. Pt will call for refills.    ACNE VULGARIS Exam: Inflammatory papule on the right zygoma, left upper lip  Chronic and persistent condition with duration or expected duration over one year. Condition is improving with treatment but not currently at goal.   Treatment Plan: Continue Clindamycin  lotion once or twice daily to face as needed for acne flares. Pt will call for refills.   Return 1-2 years, for TBSE.  IAndrea Kerns, CMA, am acting as scribe for Rexene Rattler, MD .   Documentation: I have reviewed the above documentation for accuracy and completeness, and I agree with the above.  Rexene Rattler, MD    "

## 2024-09-21 NOTE — Patient Instructions (Signed)

## 2024-09-24 ENCOUNTER — Other Ambulatory Visit (HOSPITAL_COMMUNITY): Payer: Self-pay

## 2024-09-24 NOTE — Telephone Encounter (Signed)
 Patient advised, front desk tried to check Eligibility for her Insurance for her visit 10/01/24 and it returned Termed 09/21/24.   She will call her insurance and check and give the office a call back.

## 2024-09-25 ENCOUNTER — Other Ambulatory Visit: Payer: Self-pay | Admitting: Internal Medicine

## 2024-09-25 DIAGNOSIS — F32A Depression, unspecified: Secondary | ICD-10-CM

## 2024-09-27 NOTE — Telephone Encounter (Signed)
 Requested Prescriptions  Pending Prescriptions Disp Refills   buPROPion  (WELLBUTRIN  XL) 300 MG 24 hr tablet [Pharmacy Med Name: buPROPion  HCl ER (XL) 300 MG Oral Tablet Extended Release 24 Hour] 90 tablet 3    Sig: TAKE 1 TABLET BY MOUTH DAILY     Psychiatry: Antidepressants - bupropion  Passed - 09/27/2024  1:25 PM      Passed - Cr in normal range and within 360 days    Creatinine, Ser  Date Value Ref Range Status  04/15/2024 0.79 0.57 - 1.00 mg/dL Final   Creatinine, Urine  Date Value Ref Range Status  11/15/2017 298 mg/dL Final         Passed - AST in normal range and within 360 days    AST  Date Value Ref Range Status  04/15/2024 14 0 - 40 IU/L Final         Passed - ALT in normal range and within 360 days    ALT  Date Value Ref Range Status  04/15/2024 10 0 - 32 IU/L Final         Passed - Completed PHQ-2 or PHQ-9 in the last 360 days      Passed - Last BP in normal range    BP Readings from Last 1 Encounters:  08/30/24 104/71         Passed - Valid encounter within last 6 months    Recent Outpatient Visits           5 months ago Annual physical exam   [facility] Bernardo Fend, DO       Future Appointments             In 1 year Jackquline Sawyer, MD [facility]

## 2024-10-01 ENCOUNTER — Ambulatory Visit: Admitting: Neurology

## 2024-10-08 ENCOUNTER — Ambulatory Visit: Payer: Self-pay | Admitting: Neurology

## 2024-10-08 ENCOUNTER — Ambulatory Visit: Payer: Self-pay | Admitting: Psychology

## 2024-10-08 ENCOUNTER — Encounter: Payer: Self-pay | Admitting: Psychology

## 2024-10-08 DIAGNOSIS — F4323 Adjustment disorder with mixed anxiety and depressed mood: Secondary | ICD-10-CM | POA: Diagnosis not present

## 2024-10-08 NOTE — Progress Notes (Signed)
 Photographer Health Counselor Initial Adult Exam  Name: Melinda Cortez Date: 10/08/2024 MRN: [id number] DOB: [birth year] PCP: Bernardo Fend, DO  Time spent: 12:00pm-12:55pm   55 minutes  Guardian/Payee:  n/a    Paperwork requested: No   Reason for Visit /Presenting Problem: Pt present for face-to-face initial assessment update via video.  Pt consents to telehealth video session and is aware of limitations and benefits of virtual sessions. Location of pt: home Location of therapist: home office.  Pt talked about worrying about the state of the country and world.  Addressed pt's concerns Pt struggles at times with family dynamics in her family of origin and concern about her alcoholic brother.  Pt also worries about her kids and being the best parent she can be.   Reviewed pt's treatment plan for annual update.   Updated pt's treatment plan and IA.  Pt participated in setting treatment goals.   Plan to meet monthly.   Mental Status Exam: Appearance:   Casual     Behavior:  Appropriate  Motor:  Normal  Speech/Language:   Normal Rate  Affect:  Appropriate  Mood:  normal  Thought process:  normal  Thought content:    WNL  Sensory/Perceptual disturbances:    WNL  Orientation:  oriented to person, place, time/date, and situation  Attention:  Good  Concentration:  Good  Memory:  WNL  Fund of knowledge:   Good  Insight:    Good  Judgment:   Good  Impulse Control:  Good     Reported Symptoms:  stress  Risk Assessment: Danger to Self:  No Self-injurious Behavior: No Danger to Others: No Duty to Warn:no Physical Aggression / Violence:No  Access to Firearms a concern: No  Gang Involvement:No  Patient / guardian was educated about steps to take if suicide or homicide risk level increases between visits: n/a While future psychiatric events cannot be accurately predicted, the patient does not currently require acute inpatient psychiatric care and does not currently  meet [place]  involuntary commitment criteria.  Substance Abuse History: Current substance abuse: No     Past Psychiatric History:   Previous psychological history is significant for anxiety and depression Outpatient Providers:Pt has been in therapy in the past. History of Psych Hospitalization: No  Psychological Testing: n/a   Abuse History:  Victim of: No., n/a   Report needed: No. Victim of Neglect:No. Perpetrator of n/a  Witness / Exposure to Domestic Violence: No   Protective Services Involvement: No  Witness to [employer] Violence:  No   Family History:  Family History  Problem Relation Age of Onset   Hypertension Father    Heart disease Father    Hypertension Brother    Hypertension Brother        prediabetes   Breast cancer Maternal Aunt 2       Mothers aunt   Schizophrenia Maternal Uncle    Lymphoma Paternal Grandfather    Alcoholism Other    Lung cancer Other     Living situation: the patient lives with her husband and 2 children.    Family history of depression and anxiety and substance abuse. Pt's brother is alcoholic and plays victim role per pt.   Pt grew up with parents and three brothers.  Pt is youngest and only girl .  Pt states she has always been labeled irrational bc she is a girl and more emotional.   Mental illness on mother's side of family.    Pt feels  there was more pressure put on her growing up to be perfect and get good grades bc she was the only girl.   Double standard regarding brothers.    Sexual Orientation: Straight  Relationship Status: married  Name of spouse / other:Chris If a parent, number of children / ages:pt has 2 children ages 47 (daughter)  and 52 (son).  Support Systems: spouse parents  Financial Stress:  No   Income/Employment/Disability: Employment  Financial Planner: No   Educational History: Education: Risk Manager: Protestant  Any cultural differences that may  affect / interfere with treatment:  not applicable   Recreation/Hobbies: reading  Stressors: Loss of dog and father in law.   Marital or family conflict    Strengths: Supportive Relationships, Hopefulness, Self Advocate, and Able to Communicate Effectively  Barriers:  none   Legal History: Pending legal issue / charges: The patient has no significant history of legal issues. History of legal issue / charges: n/a  Medical History/Surgical History: reviewed Past Medical History:  Diagnosis Date   Allergic rhinitis    Anxiety    Chronic headaches    HORMONAL    Complication of anesthesia    Depression    Elevated blood pressure    Elevated blood pressure affecting pregnancy in third trimester, antepartum 10/16/2017   GERD (gastroesophageal reflux disease)    Gestational hypertension 11/15/2017   Migraines    PONV (postoperative nausea and vomiting)     Past Surgical History:  Procedure Laterality Date   DILATION AND CURETTAGE OF UTERUS     DILATION AND EVACUATION N/A 01/07/2017   Procedure: DILATATION AND EVACUATION;  Surgeon: Glory High, MD;  Location: [facility] ORS;  Service: Gynecology;  Laterality: N/A;   TONSILLECTOMY AND ADENOIDECTOMY  1994    Medications: Current Outpatient Medications  Medication Sig Dispense Refill   acetaminophen  (TYLENOL ) 500 MG tablet Take 500 mg by mouth every 6 (six) hours as needed.     Ascorbic Acid (VITAMIN C) 1000 MG tablet Take 1,000 mg by mouth daily.     botulinum toxin Type A (BOTOX) 200 units injection Inject 155 units IM into multiple site in the face,neck and head once every 90 days 1 each 4   buPROPion  (WELLBUTRIN  XL) 300 MG 24 hr tablet TAKE 1 TABLET BY MOUTH DAILY 90 tablet 1   cetirizine  (ZYRTEC  ALLERGY) 10 MG tablet Take 1 tablet (10 mg total) by mouth daily. 30 tablet 0   clindamycin  (CLEOCIN -T) 1 % lotion Apply once or twice daily to face as needed for acne 60 mL 5   clobetasol  cream (TEMOVATE ) 0.05 % Apply 1 application  topically 2 (two) times daily. 60 each 1   cyclobenzaprine  (FLEXERIL ) 10 MG tablet Take 1 tablet (10 mg total) by mouth 3 (three) times daily as needed for muscle spasms. 30 tablet 3   diphenhydrAMINE  (BENADRYL ) 25 MG tablet Take 25 mg by mouth at bedtime as needed for sleep.     ibuprofen  (ADVIL ,MOTRIN ) 600 MG tablet Take 1 tablet (600 mg total) by mouth every 6 (six) hours as needed for mild pain, moderate pain or cramping. 30 tablet 0   Melatonin-Pyridoxine (MELATIN PO) Take by mouth.     Multiple Vitamin (MULTIVITAMIN) tablet Take 1 tablet by mouth daily.     omeprazole  (PRILOSEC) 40 MG capsule TAKE 1 CAPSULE BY MOUTH DAILY 90 capsule 3   ondansetron  (ZOFRAN -ODT) 4 MG disintegrating tablet Take 1 tablet (4 mg total) by mouth as needed for nausea or vomiting.  30 tablet 0   pimecrolimus  (ELIDEL ) 1 % cream Apply to affected areas eyelids twice daily until rash improved. 30 g 1   propranolol  ER (INDERAL  LA) 120 MG 24 hr capsule Take 1 capsule (120 mg total) by mouth daily. 90 capsule 3   Rimegepant Sulfate (NURTEC) 75 MG TBDP DISSOLVE 1 TABLET ON THE TONGUE  DAILY AS NEEDED 48 tablet 3   sertraline  (ZOLOFT ) 100 MG tablet TAKE 1 TABLET BY MOUTH DAILY 90 tablet 3   sodium chloride  (OCEAN) 0.65 % SOLN nasal spray Place 1 spray into both nostrils as needed for congestion.     SUMAtriptan  (IMITREX ) 100 MG tablet TAKE 1 TABLET BY MOUTH AS NEEDED FOR UP TO 1 DOSE FOR MIGRAINE .  MAY REPEAT IN 2 HOURS IF  HEADACHE PERSISTS OR RECURS. MAX 2 TABLETS IN 24 HOURS 30 tablet 3   tirzepatide (ZEPBOUND) 15 MG/0.5ML Pen Inject 15 mg into the skin once a week.     Zinc-Vitamin C (ZINC-A-COLD/VITAMIN C MT) Use as directed in the mouth or throat.     No current facility-administered medications for this visit.    No Known Allergies  Diagnoses:  F43.23  Plan of Care: Recommend ongoing therapy.   Pt participated in setting treatment goals.   Plan to meet monthly.   Pt agrees with treatment plan.  Sent treatment  plan signature form to pt.  Behavioral Health Treatment Plan   Name:Shaniya CHARLAINE UTSEY    MRN: [id number]   Treatment Plan Development Date: 10/08/24   Strengths: Supportive Relationships, Family, Friends, Journalist, Newspaper, and Able to [employer]  Supports: Spouse, Friends, and Parents   Client Statement of Needs: Pt wants to have a safe place to talk about life stressors and improve coping skills.    Treatment Level:individual therapy  Client Treatment Preferences:Individual therapy virtually   Diagnosis: Adjustment Disorder  F43.23  Symptoms:  The development of emotional or behavioral symptoms in response to an identifiable stressor(s) occurring within 3 months of the onset of the stressor(s).  Goals:  Reduce anxiety and stress using cognitive and behavioral techniques to manage symptoms. and Improve coping skills by learning effective ways to adapt and manage anxiety.  Objectives: Target Date For All Objectives: 10/08/25  Enhance problem-solving abilities and develop skills to address and resolve challenges. and Build resilience and learn to adapt to stress and hardships.  Progress Documentation:   Progressing  Interventions:  Cognitive Behavioral Therapy and Insight-Oriented  Expected duration of treatment: a year  Party responsible for implementation of interventions: Brehanna Deveny, LCSW.   This plan has been reviewed and created by the following participants: Jayven Naill, LCSW   A new plan will be created at least every 12 months.  The patient fully participated in the development of treatment plan with the clinician and verbally consents to such treatment.   Patient Treatment Plan Signature Obtained: No, pending signature via MyChart.   Conditions For Discharge Achievement of treatment goals and objectives    Veva Alma, LCSW    "

## 2024-10-10 ENCOUNTER — Other Ambulatory Visit: Payer: Self-pay | Admitting: Neurology

## 2024-10-22 ENCOUNTER — Ambulatory Visit (INDEPENDENT_AMBULATORY_CARE_PROVIDER_SITE_OTHER): Admitting: Neurology

## 2024-10-22 DIAGNOSIS — G43709 Chronic migraine without aura, not intractable, without status migrainosus: Secondary | ICD-10-CM

## 2024-10-22 MED ORDER — ONABOTULINUMTOXINA 100 UNITS IJ SOLR
200.0000 [IU] | Freq: Once | INTRAMUSCULAR | Status: AC
  Administered 2024-10-22: 155 [IU] via INTRAMUSCULAR

## 2024-10-22 NOTE — Progress Notes (Signed)

## 2024-12-21 ENCOUNTER — Ambulatory Visit: Admitting: Psychology

## 2024-12-31 ENCOUNTER — Ambulatory Visit: Payer: Self-pay | Admitting: Neurology

## 2025-01-28 ENCOUNTER — Ambulatory Visit: Payer: Self-pay | Admitting: Neurology

## 2025-04-19 ENCOUNTER — Ambulatory Visit: Admitting: Internal Medicine

## 2025-05-02 ENCOUNTER — Ambulatory Visit: Payer: Self-pay | Admitting: Neurology

## 2025-05-10 ENCOUNTER — Ambulatory Visit: Admitting: Neurology

## 2025-09-27 ENCOUNTER — Ambulatory Visit: Payer: Self-pay | Admitting: Dermatology
